# Patient Record
Sex: Male | Born: 1945 | Race: White | Hispanic: No | Marital: Married | Smoking: Never smoker
Health system: Southern US, Community
[De-identification: ages and names within clinical notes are randomized; demographics above are authoritative.]

## PROBLEM LIST (undated history)

## (undated) DIAGNOSIS — G4700 Insomnia, unspecified: Secondary | ICD-10-CM

## (undated) DIAGNOSIS — I10 Essential (primary) hypertension: Secondary | ICD-10-CM

## (undated) DIAGNOSIS — M67912 Unspecified disorder of synovium and tendon, left shoulder: Secondary | ICD-10-CM

## (undated) DIAGNOSIS — E7800 Pure hypercholesterolemia, unspecified: Secondary | ICD-10-CM

## (undated) DIAGNOSIS — F32A Depression, unspecified: Secondary | ICD-10-CM

## (undated) DIAGNOSIS — C4491 Basal cell carcinoma of skin, unspecified: Secondary | ICD-10-CM

## (undated) DIAGNOSIS — K5790 Diverticulosis of intestine, part unspecified, without perforation or abscess without bleeding: Secondary | ICD-10-CM

## (undated) DIAGNOSIS — N400 Enlarged prostate without lower urinary tract symptoms: Secondary | ICD-10-CM

## (undated) DIAGNOSIS — M1990 Unspecified osteoarthritis, unspecified site: Secondary | ICD-10-CM

## (undated) DIAGNOSIS — R7303 Prediabetes: Secondary | ICD-10-CM

## (undated) DIAGNOSIS — M25552 Pain in left hip: Secondary | ICD-10-CM

## (undated) DIAGNOSIS — M25561 Pain in right knee: Secondary | ICD-10-CM

## (undated) DIAGNOSIS — R519 Headache, unspecified: Secondary | ICD-10-CM

## (undated) DIAGNOSIS — K219 Gastro-esophageal reflux disease without esophagitis: Secondary | ICD-10-CM

## (undated) DIAGNOSIS — M25551 Pain in right hip: Secondary | ICD-10-CM

## (undated) DIAGNOSIS — I4510 Unspecified right bundle-branch block: Secondary | ICD-10-CM

## (undated) DIAGNOSIS — R011 Cardiac murmur, unspecified: Secondary | ICD-10-CM

## (undated) DIAGNOSIS — M25562 Pain in left knee: Secondary | ICD-10-CM

## (undated) HISTORY — PX: BASAL CELL CARCINOMA EXCISION: SHX1214

---

## 1998-12-31 ENCOUNTER — Encounter: Payer: Self-pay | Admitting: Cardiovascular Disease

## 1998-12-31 ENCOUNTER — Ambulatory Visit (HOSPITAL_COMMUNITY): Admission: RE | Admit: 1998-12-31 | Discharge: 1998-12-31 | Payer: Self-pay | Admitting: Cardiovascular Disease

## 2004-03-18 ENCOUNTER — Ambulatory Visit: Payer: Self-pay | Admitting: Gastroenterology

## 2004-04-21 ENCOUNTER — Ambulatory Visit: Payer: Self-pay | Admitting: Internal Medicine

## 2004-12-14 ENCOUNTER — Emergency Department (HOSPITAL_COMMUNITY): Admission: EM | Admit: 2004-12-14 | Discharge: 2004-12-15 | Payer: Self-pay | Admitting: Emergency Medicine

## 2004-12-16 ENCOUNTER — Ambulatory Visit: Payer: Self-pay | Admitting: Internal Medicine

## 2005-01-05 ENCOUNTER — Ambulatory Visit: Payer: Self-pay | Admitting: Internal Medicine

## 2005-02-04 ENCOUNTER — Ambulatory Visit: Payer: Self-pay | Admitting: Internal Medicine

## 2005-03-17 ENCOUNTER — Ambulatory Visit: Payer: Self-pay | Admitting: Internal Medicine

## 2005-03-24 ENCOUNTER — Ambulatory Visit: Payer: Self-pay | Admitting: Internal Medicine

## 2005-04-16 ENCOUNTER — Ambulatory Visit: Payer: Self-pay | Admitting: Internal Medicine

## 2005-04-30 ENCOUNTER — Ambulatory Visit: Payer: Self-pay | Admitting: Internal Medicine

## 2005-06-16 ENCOUNTER — Ambulatory Visit: Payer: Self-pay | Admitting: Internal Medicine

## 2005-06-25 ENCOUNTER — Ambulatory Visit: Payer: Self-pay | Admitting: Internal Medicine

## 2007-01-11 ENCOUNTER — Encounter: Payer: Self-pay | Admitting: Internal Medicine

## 2008-10-17 ENCOUNTER — Ambulatory Visit: Payer: Self-pay | Admitting: Diagnostic Radiology

## 2008-10-17 ENCOUNTER — Emergency Department (HOSPITAL_BASED_OUTPATIENT_CLINIC_OR_DEPARTMENT_OTHER): Admission: EM | Admit: 2008-10-17 | Discharge: 2008-10-17 | Payer: Self-pay | Admitting: Emergency Medicine

## 2008-12-20 ENCOUNTER — Emergency Department (HOSPITAL_BASED_OUTPATIENT_CLINIC_OR_DEPARTMENT_OTHER): Admission: EM | Admit: 2008-12-20 | Discharge: 2008-12-20 | Payer: Self-pay | Admitting: Emergency Medicine

## 2008-12-25 ENCOUNTER — Encounter (INDEPENDENT_AMBULATORY_CARE_PROVIDER_SITE_OTHER): Payer: Self-pay | Admitting: *Deleted

## 2009-08-05 ENCOUNTER — Encounter: Admission: RE | Admit: 2009-08-05 | Discharge: 2009-08-05 | Payer: Self-pay | Admitting: Neurology

## 2009-08-08 ENCOUNTER — Telehealth (INDEPENDENT_AMBULATORY_CARE_PROVIDER_SITE_OTHER): Payer: Self-pay | Admitting: *Deleted

## 2010-06-17 NOTE — Progress Notes (Signed)
  Phone Note Other Incoming   Request: Send information Summary of Call: Request received from [facility] forwarded to [facility].

## 2010-08-25 LAB — COMPREHENSIVE METABOLIC PANEL
ALT: 30 U/L (ref 0–53)
Alkaline Phosphatase: 70 U/L (ref 39–117)
BUN: 18 mg/dL (ref 6–23)
CO2: 25 mEq/L (ref 19–32)
GFR calc non Af Amer: >60 mL/min (ref >60)
Glucose, Bld: 146 mg/dL — ABNORMAL HIGH (ref 70–99)
Potassium: 4 mEq/L (ref 3.5–5.1)
Total Protein: 8 g/dL (ref 6.0–8.3)

## 2010-08-25 LAB — DIFFERENTIAL
Basophils Absolute: 0.1 10*3/uL (ref 0.0–0.1)
Basophils Relative: 1 % (ref 0–1)
Eosinophils Absolute: 0 10*3/uL (ref 0.0–0.7)
Monocytes Relative: 6 % (ref 3–12)
Neutro Abs: 10.1 10*3/uL — ABNORMAL HIGH (ref 1.7–7.7)
Neutrophils Relative %: 89 % — ABNORMAL HIGH (ref 43–77)

## 2010-08-25 LAB — CBC
HCT: 44 % (ref 39.0–52.0)
Hemoglobin: 15 g/dL (ref 13.0–17.0)
MCHC: 34.2 g/dL (ref 30.0–36.0)
RBC: 4.79 MIL/uL (ref 4.22–5.81)
RDW: 12 % (ref 11.5–15.5)

## 2010-08-25 LAB — URINALYSIS, ROUTINE W REFLEX MICROSCOPIC
Bilirubin Urine: NEGATIVE
Glucose, UA: NEGATIVE mg/dL
Specific Gravity, Urine: 1.025 (ref 1.005–1.030)
Urobilinogen, UA: 1 mg/dL (ref 0.0–1.0)
pH: 5.5 (ref 5.0–8.0)

## 2010-08-25 LAB — URINE CULTURE

## 2010-08-25 LAB — URINE MICROSCOPIC-ADD ON

## 2010-08-25 LAB — CULTURE, BLOOD (ROUTINE X 2)

## 2010-08-25 LAB — HEMOCCULT GUIAC POC 1CARD (OFFICE): Fecal Occult Bld: NEGATIVE

## 2010-08-25 LAB — LACTIC ACID, PLASMA: Lactic Acid, Venous: 2.1 mmol/L (ref 0.5–2.2)

## 2013-07-10 ENCOUNTER — Ambulatory Visit: Payer: Non-veteran care | Attending: Nurse Practitioner | Admitting: Physical Therapy

## 2013-07-10 DIAGNOSIS — M6281 Muscle weakness (generalized): Secondary | ICD-10-CM | POA: Insufficient documentation

## 2013-07-10 DIAGNOSIS — R609 Edema, unspecified: Secondary | ICD-10-CM | POA: Insufficient documentation

## 2013-07-10 DIAGNOSIS — M25669 Stiffness of unspecified knee, not elsewhere classified: Secondary | ICD-10-CM | POA: Insufficient documentation

## 2013-07-10 DIAGNOSIS — M25569 Pain in unspecified knee: Secondary | ICD-10-CM | POA: Insufficient documentation

## 2013-07-10 DIAGNOSIS — IMO0001 Reserved for inherently not codable concepts without codable children: Secondary | ICD-10-CM | POA: Insufficient documentation

## 2013-07-24 ENCOUNTER — Ambulatory Visit: Payer: Non-veteran care | Attending: Nurse Practitioner | Admitting: Physical Therapy

## 2013-07-24 DIAGNOSIS — M6281 Muscle weakness (generalized): Secondary | ICD-10-CM | POA: Insufficient documentation

## 2013-07-24 DIAGNOSIS — M25569 Pain in unspecified knee: Secondary | ICD-10-CM | POA: Insufficient documentation

## 2013-07-24 DIAGNOSIS — R609 Edema, unspecified: Secondary | ICD-10-CM | POA: Insufficient documentation

## 2013-07-24 DIAGNOSIS — M25669 Stiffness of unspecified knee, not elsewhere classified: Secondary | ICD-10-CM | POA: Insufficient documentation

## 2013-07-25 ENCOUNTER — Ambulatory Visit: Payer: Non-veteran care | Admitting: Physical Therapy

## 2013-08-07 ENCOUNTER — Ambulatory Visit: Payer: Non-veteran care | Admitting: Rehabilitation

## 2013-08-08 ENCOUNTER — Ambulatory Visit: Payer: Non-veteran care | Admitting: Physical Therapy

## 2014-01-24 ENCOUNTER — Encounter: Payer: Self-pay | Admitting: Gastroenterology

## 2014-08-20 ENCOUNTER — Encounter: Payer: Self-pay | Admitting: Gastroenterology

## 2016-03-08 ENCOUNTER — Encounter (HOSPITAL_BASED_OUTPATIENT_CLINIC_OR_DEPARTMENT_OTHER): Payer: Self-pay | Admitting: Emergency Medicine

## 2016-03-08 ENCOUNTER — Emergency Department (HOSPITAL_BASED_OUTPATIENT_CLINIC_OR_DEPARTMENT_OTHER)
Admission: EM | Admit: 2016-03-08 | Discharge: 2016-03-08 | Disposition: A | Discharge status: Home / Self Care | Payer: Medicare HMO | Attending: Emergency Medicine | Admitting: Emergency Medicine

## 2016-03-08 DIAGNOSIS — M25561 Pain in right knee: Secondary | ICD-10-CM | POA: Insufficient documentation

## 2016-03-08 DIAGNOSIS — I10 Essential (primary) hypertension: Secondary | ICD-10-CM | POA: Diagnosis not present

## 2016-03-08 HISTORY — DX: Essential (primary) hypertension: I10

## 2016-03-08 MED ORDER — NAPROXEN 500 MG PO TABS: 500.0000 mg | ORAL_TABLET | Freq: Two times a day (BID) | ORAL | 0 refills | Status: DC

## 2016-03-08 NOTE — ED Provider Notes (Signed)
[facility] Provider Note   CSN: [id number] Arrival date & time: 03/08/16  1611  By signing my name below, I, Soijett Blue, attest that this documentation has been prepared under the direction and in the presence of Audry Piliyler Bode Pieper, PA-C Electronically Signed: Soijett Blue, ED Scribe. 03/08/16. 5:10 PM.   History   Chief Complaint Chief Complaint  Patient presents with  . Knee Pain    HPI Mitchell Morgan is a 70 y.o. male with a PMHx of HTN, who presents to the Emergency Department complaining of gradually worsening right lateral knee pain onset 4 days. Pt reports that he was sitting in a chair and attempted to cross his legs when he felt a sharp pain to his right knee. Pt notes that his right lateral knee pain is worsened with initial ambulation following standing from a sitting position. Pt states that his right lateral knee pain is alleviated with rest. Pt denies recent injury, trauma, or fall. Pt is having associated symptoms of gait problem due to pain. He notes that he has tried Rx voltaren gel, heat, and ice with no relief of his symptoms. He denies color change, wound, rash, swelling, and any other symptoms. Pt denies taking blood thinner medications or PMHx of blood clots. Pt states that he is otherwise healthy.   The history is provided by the patient. No language interpreter was used.    Past Medical History:  Diagnosis Date  . Hypertension     There are no active problems to display for this patient.   History reviewed. No pertinent surgical history.     Home Medications    Prior to Admission medications   Not on File    Family History History reviewed. No pertinent family history.  Social History Social History  Substance Use Topics  . Smoking status: Never Smoker  . Smokeless tobacco: Not on file  . Alcohol use No     Allergies   Review of patient's allergies indicates no known allergies.   Review of Systems Review of Systems    Musculoskeletal: Positive for arthralgias (right knee) and gait problem (due to pain). Negative for joint swelling.  Skin: Negative for color change, rash and wound.   Physical Exam Updated Vital Signs BP 140/69   Pulse 61   Temp 97.7 F (36.5 C)   Resp 19   Ht 5\' 10"  (1.778 m)   Wt 220 lb (99.8 kg)   SpO2 98%   BMI 31.57 kg/m   Physical Exam  Constitutional: He is oriented to person, place, and time. Vital signs are normal. He appears well-developed and well-nourished. No distress.  HENT:  Head: Normocephalic and atraumatic.  Right Ear: Hearing normal.  Left Ear: Hearing normal.  Eyes: Conjunctivae and EOM are normal. Pupils are equal, round, and reactive to light.  Neck: Normal range of motion. Neck supple.  Cardiovascular: Normal rate and regular rhythm.   Pulmonary/Chest: Effort normal. No respiratory distress.  Abdominal: He exhibits no distension.  Musculoskeletal: Normal range of motion.       Right upper leg: He exhibits tenderness.  Point TTP right distal lateral femur above knee joint. No visible erythema, ecchymosis, or signs of infection.  Right knee:  Negative anterior/poster drawer bilaterally. Negative ballottement test. No varus or valgus laxity. No crepitus. No pain with flexion or extension.  Neurological: He is alert and oriented to person, place, and time.  Skin: Skin is warm and dry.  Psychiatric: He has a normal mood and affect.  His speech is normal and behavior is normal. Thought content normal.  Nursing note and vitals reviewed.    ED Treatments / Results  DIAGNOSTIC STUDIES: Oxygen Saturation is 98% on RA, nl by my interpretation.    COORDINATION OF CARE: 4:52 PM Discussed treatment plan with pt at bedside which includes referral to orthopedist, symptomatic treatment, ace wrap, and pt agreed to plan.   Procedures Procedures (including critical care time)  Medications Ordered in ED Medications - No data to display   Initial Impression /  Assessment and Plan / ED Course  I have reviewed the triage vital signs and the nursing notes.  Clinical Course   I have reviewed the relevant previous healthcare records. I obtained HPI from historian. Patient discussed with supervising physician  ED Course:  Assessment: Pt presents with right knee pain x 1 week ago, likely quadriceps strain as pain only with standing from sitting. No pain at rest or with ambulation. I do not suspect any fracture or dislocation. ROM intact. NVI. Pt advised to follow up with orthopedist. Pt will be given ace wrap while in the ED. Conservative therapy recommended and discussed. Patient will be discharged home & is agreeable with above plan. Returns precautions discussed. Pt appears safe for discharge.  Disposition/Plan:  DC Home Additional Verbal discharge instructions given and discussed with patient.  Pt Instructed to f/u with PCP and orthopedist in the next week for evaluation and treatment of symptoms. Return precautions given Pt acknowledges and agrees with plan  Supervising Physician Alvira Monday, MD   Final Clinical Impressions(s) / ED Diagnoses   Final diagnoses:  Acute pain of right knee    New Prescriptions New Prescriptions   No medications on file  I personally performed the services described in this documentation, which was scribed in my presence. The recorded information has been reviewed and is accurate.    Audry Pili, PA-C 03/08/16 1755    Alvira Monday, MD 03/09/16 [phone]

## 2016-03-08 NOTE — Discharge Instructions (Addendum)
Please read and follow all provided instructions.  Your diagnoses today include:  1. Acute pain of right knee    Tests performed today include: Vital signs. See below for your results today.   Medications prescribed:  Take as prescribed   Home care instructions:  Follow any educational materials contained in this packet.  *PRICE in the first 24-48 hours after injury: Protect (with brace, splint, sling), if given by your provider Rest Ice- Do not apply ice pack directly to your skin, place towel or similar between your skin and ice/ice pack. Apply ice for 20 min, then remove for 40 min while awake Compression- Wear brace, elastic bandage, splint as directed by your provider Elevate affected extremity above the level of your heart when not walking around for the first 24-48 hours   Use Ibuprofen (Motrin/Advil) 600mg  every 6 hours as needed for pain   Follow-up instructions: Please follow-up with your primary care provider for further evaluation of symptoms and treatment   Return instructions:  Please return to the Emergency Department if you do not get better, if you get worse, or new symptoms OR  - Fever (temperature greater than 101.67F)  - Bleeding that does not stop with holding pressure to the area    -Severe pain (please note that you may be more sore the day after your accident)  - Chest Pain  - Difficulty breathing  - Severe nausea or vomiting  - Inability to tolerate food and liquids  - Passing out  - Skin becoming red around your wounds  - Change in mental status (confusion or lethargy)  - New numbness or weakness    Please return if you have any other emergent concerns.  Additional Information:  Your vital signs today were: BP 140/69    Pulse 61    Temp 97.7 F (36.5 C)    Resp 19    Ht 5\' 10"  (1.778 m)    Wt 99.8 kg    SpO2 98%    BMI 31.57 kg/m  If your blood pressure (BP) was elevated above 135/85 this visit, please have this repeated by your doctor within  one month. ---------------

## 2016-03-08 NOTE — ED Notes (Signed)
PA at bedside.

## 2016-03-08 NOTE — ED Triage Notes (Signed)
Pt in c/o R knee pain x 4 days after crossing legs and feeling sharp pain to same knee. States pain is getting worse. Pt alert, interactive, ambulatory in NAD.

## 2016-07-21 DIAGNOSIS — E119 Type 2 diabetes mellitus without complications: Secondary | ICD-10-CM | POA: Insufficient documentation

## 2017-08-13 ENCOUNTER — Encounter (HOSPITAL_BASED_OUTPATIENT_CLINIC_OR_DEPARTMENT_OTHER): Payer: Self-pay

## 2017-08-13 ENCOUNTER — Other Ambulatory Visit: Payer: Self-pay

## 2017-08-13 ENCOUNTER — Emergency Department (HOSPITAL_BASED_OUTPATIENT_CLINIC_OR_DEPARTMENT_OTHER)
Admission: EM | Admit: 2017-08-13 | Discharge: 2017-08-14 | Disposition: A | Discharge status: Home / Self Care | Payer: Medicare HMO | Attending: Emergency Medicine | Admitting: Emergency Medicine

## 2017-08-13 DIAGNOSIS — I10 Essential (primary) hypertension: Secondary | ICD-10-CM | POA: Diagnosis not present

## 2017-08-13 DIAGNOSIS — Z79899 Other long term (current) drug therapy: Secondary | ICD-10-CM | POA: Insufficient documentation

## 2017-08-13 DIAGNOSIS — R630 Anorexia: Secondary | ICD-10-CM | POA: Insufficient documentation

## 2017-08-13 DIAGNOSIS — R1084 Generalized abdominal pain: Secondary | ICD-10-CM | POA: Insufficient documentation

## 2017-08-13 DIAGNOSIS — R110 Nausea: Secondary | ICD-10-CM | POA: Diagnosis not present

## 2017-08-13 HISTORY — DX: Pure hypercholesterolemia, unspecified: E78.00

## 2017-08-13 LAB — URINALYSIS, ROUTINE W REFLEX MICROSCOPIC
Bilirubin Urine: NEGATIVE
GLUCOSE, UA: NEGATIVE mg/dL
KETONES UR: NEGATIVE mg/dL
Leukocytes, UA: NEGATIVE
Nitrite: NEGATIVE
PH: 5.5 (ref 5.0–8.0)
PROTEIN: NEGATIVE mg/dL
Specific Gravity, Urine: 1.025 (ref 1.005–1.030)

## 2017-08-13 LAB — URINALYSIS, MICROSCOPIC (REFLEX): WBC, UA: NONE SEEN WBC/hpf (ref 0–5)

## 2017-08-13 NOTE — ED Triage Notes (Signed)
C/o abd pain x approx 1 month-was seen by PCP 3/4-had US-was dx constipation-used miralax with results-abd pain cont'd-pt was to have outpt CT scan and has GI appt-states pain is worse and is worse after po intake-NAD-steady gait

## 2017-08-13 NOTE — ED Notes (Signed)
Patient also informed me that he had lost 8-10 lbs in a week.

## 2017-08-14 ENCOUNTER — Emergency Department (HOSPITAL_BASED_OUTPATIENT_CLINIC_OR_DEPARTMENT_OTHER): Payer: Medicare HMO

## 2017-08-14 LAB — COMPREHENSIVE METABOLIC PANEL
ALBUMIN: 4.3 g/dL (ref 3.5–5.0)
ALK PHOS: 45 U/L (ref 38–126)
ALT: 34 U/L (ref 17–63)
AST: 36 U/L (ref 15–41)
Anion gap: 11 (ref 5–15)
BILIRUBIN TOTAL: 1.3 mg/dL — AB (ref 0.3–1.2)
BUN: 14 mg/dL (ref 6–20)
CALCIUM: 9.5 mg/dL (ref 8.9–10.3)
CO2: 24 mmol/L (ref 22–32)
Chloride: 102 mmol/L (ref 101–111)
Creatinine, Ser: 0.9 mg/dL (ref 0.61–1.24)
GFR calc Af Amer: >60 mL/min (ref >60)
GFR calc non Af Amer: >60 mL/min (ref >60)
GLUCOSE: 114 mg/dL — AB (ref 65–99)
Potassium: 3.6 mmol/L (ref 3.5–5.1)
Sodium: 137 mmol/L (ref 135–145)
TOTAL PROTEIN: 7.2 g/dL (ref 6.5–8.1)

## 2017-08-14 LAB — CBC
HCT: 40 % (ref 39.0–52.0)
Hemoglobin: 14.2 g/dL (ref 13.0–17.0)
MCH: 32.5 pg (ref 26.0–34.0)
MCHC: 35.5 g/dL (ref 30.0–36.0)
MCV: 91.5 fL (ref 78.0–100.0)
PLATELETS: 252 10*3/uL (ref 150–400)
RBC: 4.37 MIL/uL (ref 4.22–5.81)
RDW: 12 % (ref 11.5–15.5)
WBC: 7.3 10*3/uL (ref 4.0–10.5)

## 2017-08-14 LAB — LIPASE, BLOOD: Lipase: 28 U/L (ref 11–51)

## 2017-08-14 MED ORDER — IOPAMIDOL (ISOVUE-300) INJECTION 61%
100.0000 mL | Freq: Once | INTRAVENOUS | Status: AC | PRN
  Administered 2017-08-14: 100 mL via INTRAVENOUS

## 2017-08-14 MED ORDER — PANTOPRAZOLE SODIUM 40 MG IV SOLR
40.0000 mg | Freq: Once | INTRAVENOUS | Status: AC
  Administered 2017-08-14: 40 mg via INTRAVENOUS
  Filled 2017-08-14: qty 40

## 2017-08-14 NOTE — ED Provider Notes (Signed)
[facility] Provider Note: Lowella Dell, MD, FACEP  CSN: [id number] MRN: [id number] ARRIVAL: 08/13/17 at 2238 ROOM: [facility]05/[facility]   CHIEF COMPLAINT  Abdominal Pain   HISTORY OF PRESENT ILLNESS  08/14/17 2:20 AM Mitchell Morgan is a 72 y.o. male who has had abdominal pain since January of this year.  He describes the pain as generalized, though it sometimes moves around, and a sensation of fullness.  It has waxed and waned but became more severe the past several days.  He had an unremarkable colonoscopy and upper endoscopy in February 2018.  He has had an unremarkable abdominal ultrasound.  He was given a trial of MiraLAX for possible constipation without relief.  A CT scan was scheduled for next week but because he worsened yesterday he came to the ED.  His pain is moderate to severe at times, somewhat worse with movement or palpation.  He has had a decreased appetite.  He has had occasional transient nausea but no vomiting.  His stools were looser yesterday than usual but were not frank diarrhea.   Past Medical History:  Diagnosis Date  . High cholesterol   . Hypertension     History reviewed. No pertinent surgical history.  No family history on file.  Social History   Tobacco Use  . Smoking status: Never Smoker  . Smokeless tobacco: Never Used  Substance Use Topics  . Alcohol use: No  . Drug use: Never    Prior to Admission medications   Medication Sig Start Date End Date Taking? Authorizing Provider  naproxen (NAPROSYN) 500 MG tablet Take 1 tablet (500 mg total) by mouth 2 (two) times daily. 03/08/16   Audry Pili, PA-C    Allergies Shrimp [shellfish allergy]   REVIEW OF SYSTEMS  Negative except as noted here or in the History of Present Illness.   PHYSICAL EXAMINATION  Initial Vital Signs Blood pressure 121/70, pulse 62, temperature 98.4 F (36.9 C), temperature source Oral, resp. rate 16, height 5\' 10"  (1.778 m), weight 103 kg (227 lb), SpO2 98  %.  Examination General: Well-developed, well-nourished male in no acute distress; appearance consistent with age of record HENT: normocephalic; atraumatic Eyes: pupils equal, round and reactive to light; extraocular muscles intact Neck: supple Heart: regular rate and rhythm Lungs: clear to auscultation bilaterally Abdomen: soft; nondistended; mild diffuse tenderness; no masses or hepatosplenomegaly; bowel sounds present Extremities: No deformity; full range of motion; pulses normal Neurologic: Awake, alert and oriented; motor function intact in all extremities and symmetric; no facial droop Skin: Warm and dry Psychiatric: Normal mood and affect   RESULTS  Summary of this visit's results, reviewed by myself:   EKG Interpretation  Date/Time:    Ventricular Rate:    PR Interval:    QRS Duration:   QT Interval:    QTC Calculation:   R Axis:     Text Interpretation:        Laboratory Studies: Results for orders placed or performed during the hospital encounter of 08/13/17 (from the past 24 hour(s))  Lipase, blood     Status: None   Collection Time: 08/13/17 11:25 PM  Result Value Ref Range   Lipase 28 11 - 51 U/L  Comprehensive metabolic panel     Status: Abnormal   Collection Time: 08/13/17 11:25 PM  Result Value Ref Range   Sodium 137 135 - 145 mmol/L   Potassium 3.6 3.5 - 5.1 mmol/L   Chloride 102 101 - 111 mmol/L   CO2  24 22 - 32 mmol/L   Glucose, Bld 114 (H) 65 - 99 mg/dL   BUN 14 6 - 20 mg/dL   Creatinine, Ser 9.600.90 0.61 - 1.24 mg/dL   Calcium 9.5 8.9 - 45.410.3 mg/dL   Total Protein 7.2 6.5 - 8.1 g/dL   Albumin 4.3 3.5 - 5.0 g/dL   AST 36 15 - 41 U/L   ALT 34 17 - 63 U/L   Alkaline Phosphatase 45 38 - 126 U/L   Total Bilirubin 1.3 (H) 0.3 - 1.2 mg/dL   GFR calc non Af Amer >60 >60 mL/min   GFR calc Af Amer >60 >60 mL/min   Anion gap 11 5 - 15  CBC     Status: None   Collection Time: 08/13/17 11:25 PM  Result Value Ref Range   WBC 7.3 4.0 - 10.5 K/uL   RBC  4.37 4.22 - 5.81 MIL/uL   Hemoglobin 14.2 13.0 - 17.0 g/dL   HCT 09.840.0 11.939.0 - 14.752.0 %   MCV 91.5 78.0 - 100.0 fL   MCH 32.5 26.0 - 34.0 pg   MCHC 35.5 30.0 - 36.0 g/dL   RDW 82.912.0 56.211.5 - 13.015.5 %   Platelets 252 150 - 400 K/uL  Urinalysis, Routine w reflex microscopic     Status: Abnormal   Collection Time: 08/13/17 11:26 PM  Result Value Ref Range   Color, Urine YELLOW YELLOW   APPearance CLEAR CLEAR   Specific Gravity, Urine 1.025 1.005 - 1.030   pH 5.5 5.0 - 8.0   Glucose, UA NEGATIVE NEGATIVE mg/dL   Hgb urine dipstick TRACE (A) NEGATIVE   Bilirubin Urine NEGATIVE NEGATIVE   Ketones, ur NEGATIVE NEGATIVE mg/dL   Protein, ur NEGATIVE NEGATIVE mg/dL   Nitrite NEGATIVE NEGATIVE   Leukocytes, UA NEGATIVE NEGATIVE  Urinalysis, Microscopic (reflex)     Status: Abnormal   Collection Time: 08/13/17 11:26 PM  Result Value Ref Range   RBC / HPF 0-5 0 - 5 RBC/hpf   WBC, UA NONE SEEN 0 - 5 WBC/hpf   Bacteria, UA RARE (A) NONE SEEN   Squamous Epithelial / LPF 0-5 (A) NONE SEEN   Imaging Studies: Ct Abdomen Pelvis W Contrast  Result Date: 08/14/2017 CLINICAL DATA:  History of diverticulitis.  Mid abdominal pain. EXAM: CT ABDOMEN AND PELVIS WITH CONTRAST TECHNIQUE: Multidetector CT imaging of the abdomen and pelvis was performed using the standard protocol following bolus administration of intravenous contrast. CONTRAST:  100mL ISOVUE-300 IOPAMIDOL (ISOVUE-300) INJECTION 61% COMPARISON:  Abdominal ultrasound 07/19/2017 FINDINGS: Lower chest: Calcific atherosclerotic disease of the coronary arteries. Mild circumferential thickening of the distal esophagus. Hepatobiliary: No focal liver abnormality is seen. No gallstones, gallbladder wall thickening, or biliary dilatation. Pancreas: Unremarkable. No pancreatic ductal dilatation or surrounding inflammatory changes. Spleen: Normal in size without focal abnormality. Adrenals/Urinary Tract: Normal adrenal glands. Normal right kidney. 2.9 cm left upper  pole renal cyst and 1 cm left lower pole renal cyst. Stomach/Bowel: Normal stomach and small bowel. Normal appendix. Scattered left colonic diverticulosis. No significant pericolonic inflammatory changes to suggest acute diverticulitis. Mucosal thickening of the sigmoid colon may be secondary to chronic diverticulitis. Vascular/Lymphatic: Aortic atherosclerosis. No enlarged abdominal or pelvic lymph nodes. Reproductive: Enlargement of the prostate gland which measures 6.2 cm. Other: No abdominal wall hernia or abnormality. No abdominopelvic ascites. Musculoskeletal: Multilevel osteoarthritic changes of the spine. IMPRESSION: Left colonic diverticulosis with probable chronic diverticulitis. 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 Mild circumferential thickening of the distal esophagus.  Please correlate to results of endoscopy. Enlargement of the prostate gland. Please correlate to serum PSA values. Electronically Signed   By: Ted Mcalpine M.D.   On: 08/14/2017 03:21    ED COURSE  Nursing notes and initial vitals signs, including pulse oximetry, reviewed.  Vitals:   08/13/17 2251 08/14/17 0145 08/14/17 0301  BP: 136/78 121/70 (!) 144/76  Pulse: 68 62 68  Resp: 16  18  Temp: 98.4 F (36.9 C)    TempSrc: Oral    SpO2: 98% 98% 99%  Weight: 103 kg (227 lb)    Height: 5\' 10"  (1.778 m)     3:37 AM Patient advised of CT findings.  Clinically I do not suspect acute sigmoid diverticulitis.  His tenderness is not focal in that area.  He has known enlarged prostate and his PSA has been followed by his PCP.  He states the pain in his abdomen worsened over the past few days after discontinuing his Protonix.  We will have him return start his Protonix and follow-up with his PCP and his gastroenterologist.  He has already contacted them for appointments in the next few days.  PROCEDURES    ED DIAGNOSES     ICD-10-CM   1. Generalized abdominal pain R10.84         Paula Libra, MD 08/14/17 [phone]

## 2017-08-14 NOTE — ED Notes (Signed)
Patient transported to CT 

## 2017-08-14 NOTE — ED Notes (Signed)
Pt understood dc material. NAD noted. 

## 2020-02-14 ENCOUNTER — Other Ambulatory Visit: Payer: Self-pay

## 2020-02-14 ENCOUNTER — Emergency Department (HOSPITAL_BASED_OUTPATIENT_CLINIC_OR_DEPARTMENT_OTHER): Payer: Medicare HMO

## 2020-02-14 ENCOUNTER — Emergency Department (HOSPITAL_BASED_OUTPATIENT_CLINIC_OR_DEPARTMENT_OTHER)
Admission: EM | Admit: 2020-02-14 | Discharge: 2020-02-14 | Disposition: A | Discharge status: Home / Self Care | Payer: Medicare HMO | Attending: Emergency Medicine | Admitting: Emergency Medicine

## 2020-02-14 ENCOUNTER — Encounter (HOSPITAL_BASED_OUTPATIENT_CLINIC_OR_DEPARTMENT_OTHER): Payer: Self-pay | Admitting: Emergency Medicine

## 2020-02-14 DIAGNOSIS — H4902 Third [oculomotor] nerve palsy, left eye: Secondary | ICD-10-CM

## 2020-02-14 DIAGNOSIS — H02432 Paralytic ptosis of left eyelid: Secondary | ICD-10-CM | POA: Diagnosis present

## 2020-02-14 DIAGNOSIS — I10 Essential (primary) hypertension: Secondary | ICD-10-CM | POA: Insufficient documentation

## 2020-02-14 LAB — CBC WITH DIFFERENTIAL/PLATELET
Abs Immature Granulocytes: 0.02 10*3/uL (ref 0.00–0.07)
Basophils Absolute: 0.1 10*3/uL (ref 0.0–0.1)
Basophils Relative: 1 %
Eosinophils Absolute: 0.1 10*3/uL (ref 0.0–0.5)
Eosinophils Relative: 1 %
HCT: 39.3 % (ref 39.0–52.0)
Hemoglobin: 13.6 g/dL (ref 13.0–17.0)
Immature Granulocytes: 0 %
Lymphocytes Relative: 21 %
Lymphs Abs: 2.3 10*3/uL (ref 0.7–4.0)
MCH: 31.7 pg (ref 26.0–34.0)
MCHC: 34.6 g/dL (ref 30.0–36.0)
MCV: 91.6 fL (ref 80.0–100.0)
Monocytes Absolute: 1 10*3/uL (ref 0.1–1.0)
Monocytes Relative: 10 %
Neutro Abs: 7.3 10*3/uL (ref 1.7–7.7)
Neutrophils Relative %: 67 %
Platelets: 228 10*3/uL (ref 150–400)
RBC: 4.29 MIL/uL (ref 4.22–5.81)
RDW: 11.9 % (ref 11.5–15.5)
WBC: 10.9 10*3/uL — ABNORMAL HIGH (ref 4.0–10.5)
nRBC: 0 % (ref 0.0–0.2)

## 2020-02-14 LAB — BASIC METABOLIC PANEL
Anion gap: 8 (ref 5–15)
BUN: 20 mg/dL (ref 8–23)
CO2: 26 mmol/L (ref 22–32)
Calcium: 9 mg/dL (ref 8.9–10.3)
Chloride: 101 mmol/L (ref 98–111)
Creatinine, Ser: 0.92 mg/dL (ref 0.61–1.24)
GFR calc Af Amer: >60 mL/min (ref >60)
GFR calc non Af Amer: >60 mL/min (ref >60)
Glucose, Bld: 128 mg/dL — ABNORMAL HIGH (ref 70–99)
Potassium: 3.9 mmol/L (ref 3.5–5.1)
Sodium: 135 mmol/L (ref 135–145)

## 2020-02-14 LAB — PROTIME-INR
INR: 1.1 (ref 0.8–1.2)
Prothrombin Time: 13.6 seconds (ref 11.4–15.2)

## 2020-02-14 MED ORDER — IOHEXOL 350 MG/ML SOLN
100.0000 mL | Freq: Once | INTRAVENOUS | Status: AC | PRN
  Administered 2020-02-14: 75 mL via INTRAVENOUS

## 2020-02-14 NOTE — ED Notes (Signed)
Patient transported to CT 

## 2020-02-14 NOTE — ED Provider Notes (Signed)
[facility] EMERGENCY DEPARTMENT Provider Note   CSN: [id number] Arrival date & time: 02/14/20  1026     History Chief Complaint  Patient presents with  . eye lid paralysis    left    Mitchell Morgan is a 74 y.o. male.  Presents to ER with concern for left eyelid weakness, eye muscle weakness.  Over the past week or so, patient has noted weakness of his left eyelid, increasing double vision.  Went to his ophthalmologist, was referred to neuro-ophthalmologist.  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  Reviewed case with Dr. Sherryll Burger, neuro-ophthalmologist.  He states this is most likely a microvascular lesion which she anticipates will resolve with conservative management over the next couple months.  However he wants to rule out a PCOM aneurysm.  If CTA negative, he recommends wearing eyelid patch and follow-up in his clinic.  Continue baby aspirin, follow-up with PCP as well.  He feels this finding does not warrant admission for a full stroke work up and would ordinarily manage this out patient.   Patient denies any blurred vision, just double vision when opening both eyes. No speech changes, no difficulty walking, balance issues, no numbness or weakness elsewhere.  No change in taste or hearing.  HPI     Past Medical History:  Diagnosis Date  . High cholesterol   . Hypertension     There are no problems to display for this patient.   History reviewed. No pertinent surgical history.     No family history on file.  Social History   Tobacco Use  . Smoking status: Never Smoker  . Smokeless tobacco: Never Used  Substance Use Topics  . Alcohol use: No  . Drug use: Never    Home Medications Prior to Admission medications   Not on File    Allergies    Shrimp [shellfish allergy]  Review of Systems   Review of Systems  Constitutional:  Negative for chills and fever.  HENT: Negative for ear pain and sore throat.   Eyes: Positive for visual disturbance. Negative for pain.  Respiratory: Negative for cough and shortness of breath.   Cardiovascular: Negative for chest pain and palpitations.  Gastrointestinal: Negative for abdominal pain and vomiting.  Genitourinary: Negative for dysuria and hematuria.  Musculoskeletal: Negative for arthralgias and back pain.  Skin: Negative for color change and rash.  Neurological: Positive for weakness. Negative for seizures and syncope.  All other systems reviewed and are negative.   Physical Exam Updated Vital Signs BP 139/74 (BP Location: Right Arm)   Pulse 62   Temp 98.9 F (37.2 C)   Resp 16   SpO2 98%   Physical Exam Vitals and nursing note reviewed.  Constitutional:      Appearance: He is well-developed.  HENT:     Head: Normocephalic and atraumatic.  Eyes:     Conjunctiva/sclera: Conjunctivae normal.  Cardiovascular:     Rate and Rhythm: Normal rate and regular rhythm.     Heart sounds: No murmur heard.   Pulmonary:     Effort: Pulmonary effort is normal. No respiratory distress.     Breath sounds: Normal breath sounds.  Abdominal:     Palpations: Abdomen is soft.     Tenderness: There is no abdominal tenderness.  Musculoskeletal:     Cervical back: Neck supple.  Skin:    General: Skin  is warm and dry.     Capillary Refill: Capillary refill takes less than 2 seconds.  Neurological:     Mental Status: He is alert.     Comments: AAOx3 CN 2-12 intact, except isolated CN III palsy No APD Speech clear visual fields intact 5/5 strength in b/l UE and LE Sensation to light touch intact in b/l UE and LE Normal FNF Normal gait      ED Results / Procedures / Treatments   Labs (all labs ordered are listed, but only abnormal results are displayed) Labs Reviewed  CBC WITH DIFFERENTIAL/PLATELET - Abnormal; Notable for the following components:      Result Value     WBC 10.9 (*)    All other components within normal limits  BASIC METABOLIC PANEL - Abnormal; Notable for the following components:   Glucose, Bld 128 (*)    All other components within normal limits  PROTIME-INR    EKG EKG Interpretation  Date/Time:  Wednesday February 14 2020 10:32:58 EDT Ventricular Rate:  67 PR Interval:  162 QRS Duration: 162 QT Interval:  432 QTC Calculation: 456 R Axis:   -89 Text Interpretation: Normal sinus rhythm Left axis deviation Right bundle branch block Anterolateral infarct , age undetermined Abnormal ECG Confirmed by Marianna Fuss ([id number]) on 02/14/2020 11:45:21 AM   Radiology No results found.  Procedures Procedures (including critical care time)  Medications Ordered in ED Medications  iohexol (OMNIPAQUE) 350 MG/ML injection 100 mL (75 mLs Intravenous Contrast Given 02/14/20 1508)    ED Course  I have reviewed the triage vital signs and the nursing notes.  Pertinent labs & imaging results that were available during my care of the patient were reviewed by me and considered in my medical decision making (see chart for details).  Clinical Course as of Feb 13 1522  Wed Feb 14, 2020  1253 shah   [RD]  1312 Contacted [place] [facility], nurse will try to get a hold of him   [RD]    Clinical Course User Index [RD] Milagros Loll, MD   MDM Rules/Calculators/A&P                          74 year old male presents to ER from neuro ophthalmology office with concern for CN III palsy, sent to rule out PCOM aneurysm.  On physical exam, patient has isolated cranial nerve III palsy, no other neurologic findings.  CTA head and neck has been ordered.  Signed out to Dr. Silverio Lay.  Per recommendations of Dr. Sherryll Burger, if CT negative, eye patch, follow-up outpatient with primary doctor and his clinic, continue baby aspirin, no other changes.   Final Clinical Impression(s) / ED Diagnoses Final diagnoses:  CN III palsy, left    Rx / DC Orders ED  Discharge Orders    None       Milagros Loll, MD 02/14/20 1523

## 2020-02-14 NOTE — ED Provider Notes (Signed)
  Physical Exam  BP (!) 141/79 (BP Location: Right Arm)   Pulse 62   Temp 98.9 F (37.2 C)   Resp 18   SpO2 98%   Physical Exam  ED Course/Procedures   Clinical Course as of Feb 14 1619  Wed Feb 14, 2020  1253 shah   [RD]  1312 Contacted [place] [facility] office, nurse will try to get a hold of him   [RD]    Clinical Course User Index [RD] Milagros Loll, MD    Procedures  MDM  Patient care assumed at 3 PM.  Patient has a isolated cranial nerve III palsy.  Signout pending CTA to rule out aneurysm and if is negative he can follow-up with ophthalmology outpatient  4:22 PM CTA showed no aneurysm. Stable for discharge    Charlynne Pander, MD 02/14/20 1622

## 2020-02-14 NOTE — Discharge Instructions (Addendum)
Wear eye patch.   See eye doctor for follow up   Return to ER if you have worse double vision, headaches, trouble speaking

## 2020-02-14 NOTE — ED Triage Notes (Addendum)
Eye issues x 5 days ,  Double vision , headache , seen by specialist sent to Ed  For further eval and rule out aneurysm per pt. Obvious left eye lid weakness. Reports  Eye drainage as well .

## 2021-06-07 IMAGING — CT CT ANGIO NECK
1 of 11 series · 5 of 33 positions shown · IV contrast (Omnipaque)
Comparison: MRI head 08/05/2009

CLINICAL DATA: Acute neuro deficit. Suspect stroke. Left facial
weakness, double vision.

EXAM:
CT ANGIOGRAPHY HEAD AND NECK
TECHNIQUE: Multidetector CT imaging of the head and neck was performed using
the standard protocol during bolus administration of intravenous
contrast. Multiplanar CT image reconstructions and MIPs were
obtained to evaluate the vascular anatomy. Carotid stenosis
measurements (when applicable) are obtained utilizing NASCET
criteria, using the distal internal carotid diameter as the
denominator.
CONTRAST:  75mL OMNIPAQUE IOHEXOL 350 MG/ML SOLN

[Series 11: axial thin · axial · 0.44mm/px · z∈[-323,-79]mm · 5 of 368 slices shown]
[im 62/368  soft-tissue]
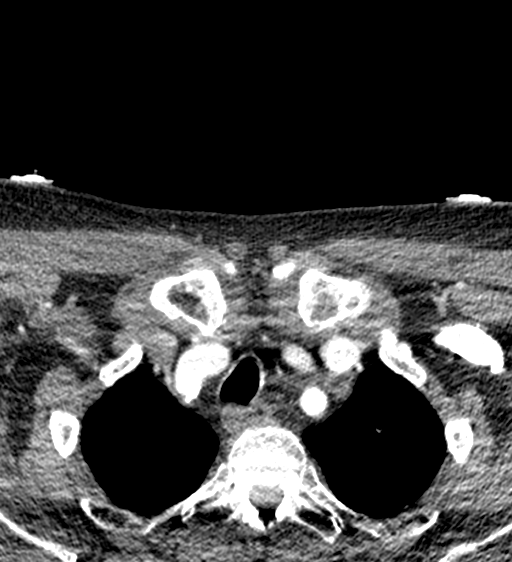
[im 123/368  bone]
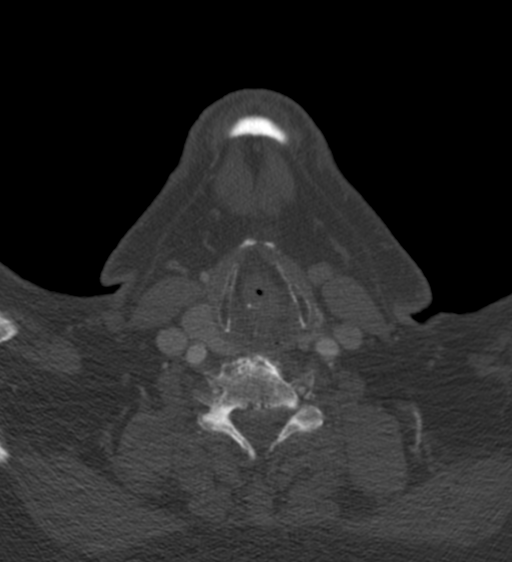
[im 184/368  soft-tissue]
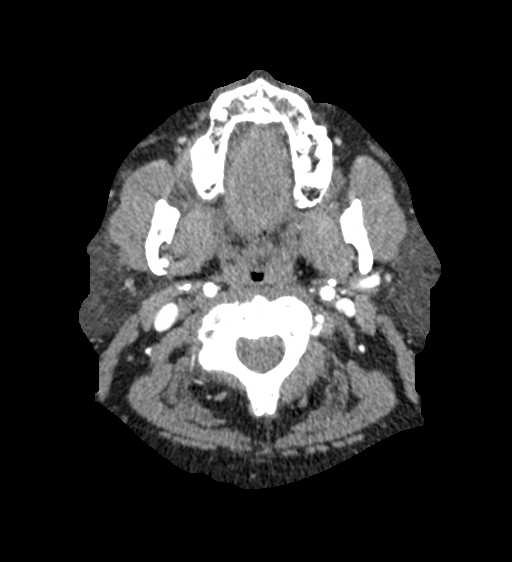
[im 245/368  bone]
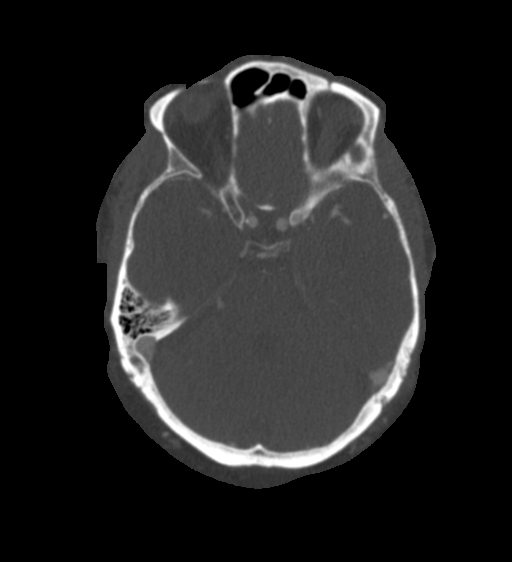
[im 306/368  soft-tissue]
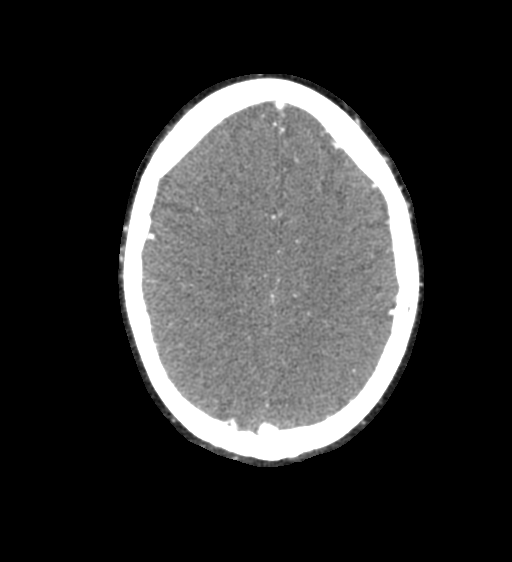

[5 of 33 positions shown; findings below may reference images not displayed]

FINDINGS: CT HEAD FINDINGS

Brain: No evidence of acute infarction, hemorrhage, hydrocephalus,
extra-axial collection or mass lesion/mass effect.

Vascular: Negative for hyperdense vessel

Skull: Negative

Sinuses: Paranasal sinuses clear.

Orbits: Negative orbit

Review of the MIP images confirms the above findings

CTA NECK FINDINGS

Aortic arch: Standard branching. Imaged portion shows no evidence of
aneurysm or dissection. No significant stenosis of the major arch
vessel origins.

Right carotid system: Mild atherosclerotic calcification right
carotid bifurcation without significant stenosis.

Left carotid system: Atherosclerotic calcification proximal left
internal carotid artery with approximately 50% diameter stenosis.
Left external carotid artery widely patent.

Vertebral arteries: Small vertebral arteries bilaterally are patent
to the basilar without stenosis.

Skeleton: Cervical spondylosis diffusely. Anterolisthesis C4-5. No
acute skeletal abnormality.

Other neck: Negative for mass or adenopathy.

Upper chest: Lung apices clear bilaterally.

Review of the MIP images confirms the above findings

CTA HEAD FINDINGS

Anterior circulation: Cavernous carotid widely patent bilaterally
with minimal atherosclerotic disease. Anterior and middle cerebral
arteries patent bilaterally without stenosis.

Posterior circulation: Both vertebral arteries patent to the
basilar. Mild calcific stenosis distal left vertebral artery. PICA
not well visualized. AICA patent bilaterally. Small basilar due to
fetal origin of the posterior cerebral arteries. Basilar ends in the
superior cerebellar artery bilaterally. Both posterior cerebral
arteries have origin from the internal carotid artery and are widely
patent without stenosis or occlusion.

Venous sinuses: Negative

Anatomic variants: None

Review of the MIP images confirms the above findings
IMPRESSION: 1. No acute intracranial abnormality.
2. Negative for intracranial large vessel occlusion
3. Mild atherosclerotic disease right carotid bifurcation without
stenosis. 50% diameter stenosis proximal left internal carotid
artery due to calcific stenosis.
4. Both vertebral arteries patent to the basilar with mild stenosis
distally.

## 2022-01-01 ENCOUNTER — Encounter: Payer: Self-pay | Admitting: Podiatry

## 2022-01-01 ENCOUNTER — Ambulatory Visit: Payer: Medicare HMO | Admitting: Podiatry

## 2022-01-01 DIAGNOSIS — L600 Ingrowing nail: Secondary | ICD-10-CM | POA: Diagnosis not present

## 2022-01-01 NOTE — Progress Notes (Signed)
  Subjective:  Patient ID: Mitchell Morgan, male    DOB: [birth year],   MRN: [id number]  Chief Complaint  Patient presents with   Ingrown Toenail    Left great toe possible ingrown - patient is not a diabetic     76 y.o. male presents for concern of left great ingrown toenail. Relates the right side of his left great toe has been sore. Relates he was at a salon and does not think they got all of the nail.  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  Denies any other pedal complaints. Denies n/v/f/c.   Past Medical History:  Diagnosis Date   High cholesterol    Hypertension     Objective:  Physical Exam: Vascular: DP/PT pulses 2/4 bilateral. CFT <3 seconds. Normal hair growth on digits. No edema.  Skin. No lacerations or abrasions bilateral feet. Left hallux nail medial border incurvated. No erythema edema or purulence noted.  Musculoskeletal: MMT 5/5 bilateral lower extremities in DF, PF, Inversion and Eversion. Deceased ROM in DF of ankle joint.  Neurological: Sensation intact to light touch.   Assessment:   1. Ingrown left greater toenail      Plan:  Patient was evaluated and treated and all questions answered. Patient requesting to have nail trimmed today as he feels there is a piece of ingrown.  Nail on left hallux debrided back in slant back fashion. Irrigated and covered with neosporin and a band aid.  Discussed in future if continues to have problem we can try ingrown nail removal procedure.  Will follow-up as needed.      Louann Sjogren, DPM

## 2022-09-28 ENCOUNTER — Encounter (HOSPITAL_BASED_OUTPATIENT_CLINIC_OR_DEPARTMENT_OTHER): Payer: Self-pay | Admitting: Urology

## 2022-09-28 ENCOUNTER — Emergency Department (HOSPITAL_BASED_OUTPATIENT_CLINIC_OR_DEPARTMENT_OTHER)
Admission: EM | Admit: 2022-09-28 | Discharge: 2022-09-28 | Discharge status: Against Medical Advice | Payer: No Typology Code available for payment source | Attending: Emergency Medicine | Admitting: Emergency Medicine

## 2022-09-28 ENCOUNTER — Other Ambulatory Visit: Payer: Self-pay

## 2022-09-28 DIAGNOSIS — R200 Anesthesia of skin: Secondary | ICD-10-CM | POA: Diagnosis present

## 2022-09-28 DIAGNOSIS — Z5321 Procedure and treatment not carried out due to patient leaving prior to being seen by health care provider: Secondary | ICD-10-CM | POA: Insufficient documentation

## 2022-09-28 NOTE — ED Triage Notes (Signed)
Pt states approx 3 weeks ago has gotten numbness in bilateral legs and bilateral feet, states is now having to ambulate with a walker and has gotten worse over past 10 days  Pain intermittent from mid back down

## 2022-10-13 ENCOUNTER — Emergency Department (HOSPITAL_COMMUNITY): Payer: No Typology Code available for payment source

## 2022-10-13 ENCOUNTER — Encounter (HOSPITAL_COMMUNITY): Payer: Self-pay

## 2022-10-13 ENCOUNTER — Inpatient Hospital Stay (HOSPITAL_COMMUNITY)
Admission: EM | Admit: 2022-10-13 | Discharge: 2022-10-22 | DRG: 519 | Disposition: A | Discharge status: Home Health | Payer: No Typology Code available for payment source | Attending: Internal Medicine | Admitting: Internal Medicine

## 2022-10-13 ENCOUNTER — Other Ambulatory Visit: Payer: Self-pay

## 2022-10-13 DIAGNOSIS — Z7982 Long term (current) use of aspirin: Secondary | ICD-10-CM

## 2022-10-13 DIAGNOSIS — R29898 Other symptoms and signs involving the musculoskeletal system: Secondary | ICD-10-CM | POA: Diagnosis not present

## 2022-10-13 DIAGNOSIS — E538 Deficiency of other specified B group vitamins: Secondary | ICD-10-CM | POA: Diagnosis present

## 2022-10-13 DIAGNOSIS — Z6831 Body mass index (BMI) 31.0-31.9, adult: Secondary | ICD-10-CM

## 2022-10-13 DIAGNOSIS — I10 Essential (primary) hypertension: Secondary | ICD-10-CM | POA: Diagnosis present

## 2022-10-13 DIAGNOSIS — M4804 Spinal stenosis, thoracic region: Principal | ICD-10-CM | POA: Diagnosis present

## 2022-10-13 DIAGNOSIS — K649 Unspecified hemorrhoids: Secondary | ICD-10-CM | POA: Diagnosis not present

## 2022-10-13 DIAGNOSIS — Z91013 Allergy to seafood: Secondary | ICD-10-CM

## 2022-10-13 DIAGNOSIS — E669 Obesity, unspecified: Secondary | ICD-10-CM | POA: Diagnosis present

## 2022-10-13 DIAGNOSIS — Z9181 History of falling: Secondary | ICD-10-CM

## 2022-10-13 DIAGNOSIS — E119 Type 2 diabetes mellitus without complications: Secondary | ICD-10-CM | POA: Diagnosis present

## 2022-10-13 DIAGNOSIS — G992 Myelopathy in diseases classified elsewhere: Secondary | ICD-10-CM | POA: Diagnosis present

## 2022-10-13 DIAGNOSIS — M5414 Radiculopathy, thoracic region: Secondary | ICD-10-CM | POA: Diagnosis present

## 2022-10-13 DIAGNOSIS — Z79899 Other long term (current) drug therapy: Secondary | ICD-10-CM

## 2022-10-13 DIAGNOSIS — K5900 Constipation, unspecified: Secondary | ICD-10-CM | POA: Diagnosis not present

## 2022-10-13 DIAGNOSIS — R32 Unspecified urinary incontinence: Secondary | ICD-10-CM

## 2022-10-13 DIAGNOSIS — Z79620 Long term (current) use of immunosuppressive biologic: Secondary | ICD-10-CM

## 2022-10-13 DIAGNOSIS — Z888 Allergy status to other drugs, medicaments and biological substances status: Secondary | ICD-10-CM

## 2022-10-13 DIAGNOSIS — E7800 Pure hypercholesterolemia, unspecified: Secondary | ICD-10-CM | POA: Diagnosis present

## 2022-10-13 DIAGNOSIS — M5415 Radiculopathy, thoracolumbar region: Secondary | ICD-10-CM | POA: Diagnosis present

## 2022-10-13 LAB — COMPREHENSIVE METABOLIC PANEL
ALT: 33 U/L (ref 0–44)
AST: 30 U/L (ref 15–41)
Albumin: 4 g/dL (ref 3.5–5.0)
Alkaline Phosphatase: 35 U/L — ABNORMAL LOW (ref 38–126)
Anion gap: 8 (ref 5–15)
BUN: 18 mg/dL (ref 8–23)
CO2: 24 mmol/L (ref 22–32)
Calcium: 8.7 mg/dL — ABNORMAL LOW (ref 8.9–10.3)
Chloride: 100 mmol/L (ref 98–111)
Creatinine, Ser: 1.23 mg/dL (ref 0.61–1.24)
GFR, Estimated: >60 mL/min (ref >60)
Glucose, Bld: 158 mg/dL — ABNORMAL HIGH (ref 70–99)
Potassium: 4.1 mmol/L (ref 3.5–5.1)
Sodium: 132 mmol/L — ABNORMAL LOW (ref 135–145)
Total Bilirubin: 0.9 mg/dL (ref 0.3–1.2)
Total Protein: 7.3 g/dL (ref 6.5–8.1)

## 2022-10-13 LAB — CBC WITH DIFFERENTIAL/PLATELET
Abs Immature Granulocytes: 0.03 10*3/uL (ref 0.00–0.07)
Basophils Absolute: 0 10*3/uL (ref 0.0–0.1)
Basophils Relative: 0 %
Eosinophils Absolute: 0.1 10*3/uL (ref 0.0–0.5)
Eosinophils Relative: 1 %
HCT: 43.5 % (ref 39.0–52.0)
Hemoglobin: 15.1 g/dL (ref 13.0–17.0)
Immature Granulocytes: 0 %
Lymphocytes Relative: 21 %
Lymphs Abs: 2.1 10*3/uL (ref 0.7–4.0)
MCH: 32.1 pg (ref 26.0–34.0)
MCHC: 34.7 g/dL (ref 30.0–36.0)
MCV: 92.6 fL (ref 80.0–100.0)
Monocytes Absolute: 0.7 10*3/uL (ref 0.1–1.0)
Monocytes Relative: 6 %
Neutro Abs: 7.3 10*3/uL (ref 1.7–7.7)
Neutrophils Relative %: 72 %
Platelets: 252 10*3/uL (ref 150–400)
RBC: 4.7 MIL/uL (ref 4.22–5.81)
RDW: 12.5 % (ref 11.5–15.5)
WBC: 10.2 10*3/uL (ref 4.0–10.5)
nRBC: 0 % (ref 0.0–0.2)

## 2022-10-13 MED ORDER — DIAZEPAM 5 MG/ML IJ SOLN
INTRAMUSCULAR | Status: AC
  Filled 2022-10-13: qty 2

## 2022-10-13 MED ORDER — DIAZEPAM 5 MG/ML IJ SOLN
5.0000 mg | Freq: Once | INTRAMUSCULAR | Status: AC | PRN
  Administered 2022-10-13: 5 mg via INTRAVENOUS
  Filled 2022-10-13: qty 2

## 2022-10-13 MED ORDER — HYDROMORPHONE HCL 1 MG/ML IJ SOLN
1.0000 mg | Freq: Once | INTRAMUSCULAR | Status: AC
  Administered 2022-10-13: 1 mg via INTRAVENOUS
  Filled 2022-10-13: qty 1

## 2022-10-13 NOTE — ED Notes (Signed)
Report called to [facility] ED charge

## 2022-10-13 NOTE — ED Notes (Signed)
Patient transported to MRI 

## 2022-10-13 NOTE — ED Provider Notes (Signed)
Mitchell Morgan Provider Note   CSN: [id number] Arrival date & time: 10/13/22  2017     History  Chief Complaint  Patient presents with   Back Pain   Numbness    Mitchell Morgan is a 77 y.o. male history of arthritis, here presenting with trouble urinating and incontinence.  Patient states that he has been having mid back pain for the last month or so.  Patient also noticed more falls and increasing unsteady gait.  Patient states that for the last week or so, he cannot feel when he urinates or have bowel movements.  He states that he saw [facility] last week for his knee and was told that he may have some back problems.  He was told that he needs a MRI of the cervical and thoracic spine.  Patient states that today he noticed worsening trouble walking.  He states that he is unable to walk without assistance.  He also has worsening numbness in his groin area.  The history is provided by the patient.       Home Medications Prior to Admission medications   Not on File      Allergies    Shrimp [shellfish allergy]    Review of Systems   Review of Systems  Musculoskeletal:  Positive for back pain.  All other systems reviewed and are negative.   Physical Exam Updated Vital Signs BP (!) 142/75   Pulse 74   Temp 98.1 F (36.7 C) (Oral)   Resp 15   Ht 5\' 10"  (1.778 m)   Wt 97.1 kg   SpO2 97%   BMI 30.71 kg/m  Physical Exam Vitals and nursing note reviewed.  Constitutional:      Comments: Uncomfortable  HENT:     Head: Normocephalic.     Nose: Nose normal.     Mouth/Throat:     Mouth: Mucous membranes are moist.  Eyes:     Extraocular Movements: Extraocular movements intact.     Pupils: Pupils are equal, round, and reactive to light.  Cardiovascular:     Rate and Rhythm: Normal rate and regular rhythm.     Pulses: Normal pulses.     Heart sounds: Normal heart sounds.  Pulmonary:     Effort: Pulmonary effort is normal.      Breath sounds: Normal breath sounds.  Abdominal:     General: Abdomen is flat.     Palpations: Abdomen is soft.  Musculoskeletal:     Cervical back: Normal range of motion and neck supple.  Skin:    General: Skin is warm.     Capillary Refill: Capillary refill takes less than 2 seconds.  Neurological:     Mental Status: He is alert.     Comments: Patient has some mild saddle anesthesia and decreased sensation inside of bilateral thighs.  Patient has normal knee reflexes.  Patient has a questionable sensory level around the pelvic area (T12).  Patient has motor strength 3 out of 5 bilateral lower extremities.  Patient's motor strength is 5 of 5 bilateral arms.     ED Results / Procedures / Treatments   Labs (all labs ordered are listed, but only abnormal results are displayed) Labs Reviewed  CBC WITH DIFFERENTIAL/PLATELET  COMPREHENSIVE METABOLIC PANEL    EKG None  Radiology No results found.  Procedures Procedures    Medications Ordered in ED Medications  HYDROmorphone (DILAUDID) injection 1 mg (has no administration in time range)  diazepam (  VALIUM) injection 5 mg (has no administration in time range)    ED Course/ Medical Decision Making/ A&P                             Medical Decision Making Mitchell Morgan is a 77 y.o. male here presenting with incontinence and saddle anesthesia.  This is progressive over the last month and over the last week it got worse and since yesterday he has been falling.  He has a questionable sensory level at T12.  I am concerned for cauda equina syndrome.  I ordered MRI of the thoracic and lumbar spine.  He was told that he needs an MRI of the cervical spine but his orthopedic doctor.  Patient will need medicines for claustrophobia for MRI.  9:44 PM I was told by MRI at [facility] that they are unable to get to him tonight.  I discussed case with Dr. Particia Nearing at [facility] and she will be accepting doctor.  Anticipate that patient will need  neurosurgery consult after MRI results.  Patient will be brought over to come by private vehicle.   Problems Addressed: Urinary incontinence, unspecified type: acute illness or injury Weakness of both lower extremities: acute illness or injury  Amount and/or Complexity of Data Reviewed Labs: ordered. Radiology: ordered.  Risk Prescription drug management.    Final Clinical Impression(s) / ED Diagnoses Final diagnoses:  None    Rx / DC Orders ED Discharge Orders     None         Charlynne Pander, MD 10/13/22 2145

## 2022-10-13 NOTE — ED Notes (Signed)
Bladder scanned pt, pt has 94 mL of urine retained in bladder.

## 2022-10-13 NOTE — ED Triage Notes (Signed)
Pt arrives c./o lower back pain and numbness from hips down to feet. Denies incontinence, but states that he cannot feel it when he urinates or has a bowel movement x3 days. Has not been able to ambulate normally/unassisted since 5/8. Symptoms have progressively worsened, and significantly worse today. Denies injury. Frequent falls lately due to legs giving out.  Saw [facility] last week for follow up on knee and MRI was ordered, but was cancelled due to not having prior authorization.

## 2022-10-14 ENCOUNTER — Other Ambulatory Visit: Payer: Self-pay | Admitting: Neurosurgery

## 2022-10-14 DIAGNOSIS — R32 Unspecified urinary incontinence: Secondary | ICD-10-CM | POA: Diagnosis present

## 2022-10-14 DIAGNOSIS — Z6831 Body mass index (BMI) 31.0-31.9, adult: Secondary | ICD-10-CM | POA: Diagnosis not present

## 2022-10-14 DIAGNOSIS — K5900 Constipation, unspecified: Secondary | ICD-10-CM | POA: Diagnosis not present

## 2022-10-14 DIAGNOSIS — E538 Deficiency of other specified B group vitamins: Secondary | ICD-10-CM | POA: Diagnosis present

## 2022-10-14 DIAGNOSIS — Z79620 Long term (current) use of immunosuppressive biologic: Secondary | ICD-10-CM | POA: Diagnosis not present

## 2022-10-14 DIAGNOSIS — I10 Essential (primary) hypertension: Secondary | ICD-10-CM | POA: Diagnosis present

## 2022-10-14 DIAGNOSIS — Z7982 Long term (current) use of aspirin: Secondary | ICD-10-CM | POA: Diagnosis not present

## 2022-10-14 DIAGNOSIS — R29898 Other symptoms and signs involving the musculoskeletal system: Secondary | ICD-10-CM | POA: Diagnosis present

## 2022-10-14 DIAGNOSIS — E669 Obesity, unspecified: Secondary | ICD-10-CM | POA: Diagnosis present

## 2022-10-14 DIAGNOSIS — M5415 Radiculopathy, thoracolumbar region: Secondary | ICD-10-CM | POA: Diagnosis present

## 2022-10-14 DIAGNOSIS — G992 Myelopathy in diseases classified elsewhere: Secondary | ICD-10-CM | POA: Diagnosis present

## 2022-10-14 DIAGNOSIS — M4804 Spinal stenosis, thoracic region: Secondary | ICD-10-CM | POA: Diagnosis present

## 2022-10-14 DIAGNOSIS — E119 Type 2 diabetes mellitus without complications: Secondary | ICD-10-CM | POA: Diagnosis present

## 2022-10-14 DIAGNOSIS — Z79899 Other long term (current) drug therapy: Secondary | ICD-10-CM | POA: Diagnosis not present

## 2022-10-14 DIAGNOSIS — K649 Unspecified hemorrhoids: Secondary | ICD-10-CM | POA: Diagnosis not present

## 2022-10-14 DIAGNOSIS — Z9181 History of falling: Secondary | ICD-10-CM | POA: Diagnosis not present

## 2022-10-14 DIAGNOSIS — Z888 Allergy status to other drugs, medicaments and biological substances status: Secondary | ICD-10-CM | POA: Diagnosis not present

## 2022-10-14 DIAGNOSIS — E7800 Pure hypercholesterolemia, unspecified: Secondary | ICD-10-CM | POA: Diagnosis present

## 2022-10-14 DIAGNOSIS — M5414 Radiculopathy, thoracic region: Secondary | ICD-10-CM | POA: Diagnosis present

## 2022-10-14 DIAGNOSIS — Z91013 Allergy to seafood: Secondary | ICD-10-CM | POA: Diagnosis not present

## 2022-10-14 LAB — VITAMIN B12: Vitamin B-12: 195 pg/mL (ref 180–914)

## 2022-10-14 LAB — GLUCOSE, CAPILLARY
Glucose-Capillary: 137 mg/dL — ABNORMAL HIGH (ref 70–99)
Glucose-Capillary: 153 mg/dL — ABNORMAL HIGH (ref 70–99)
Glucose-Capillary: 157 mg/dL — ABNORMAL HIGH (ref 70–99)
Glucose-Capillary: 221 mg/dL — ABNORMAL HIGH (ref 70–99)

## 2022-10-14 LAB — PHOSPHORUS: Phosphorus: 2.7 mg/dL (ref 2.5–4.6)

## 2022-10-14 LAB — MAGNESIUM: Magnesium: 2.2 mg/dL (ref 1.7–2.4)

## 2022-10-14 LAB — TSH: TSH: 0.414 u[IU]/mL (ref 0.350–4.500)

## 2022-10-14 MED ORDER — DOCUSATE SODIUM 100 MG PO CAPS
100.0000 mg | ORAL_CAPSULE | Freq: Two times a day (BID) | ORAL | Status: DC
  Administered 2022-10-14 (×2): 100 mg via ORAL
  Filled 2022-10-14 (×2): qty 1

## 2022-10-14 MED ORDER — POLYETHYLENE GLYCOL 3350 17 G PO PACK
17.0000 g | PACK | Freq: Every day | ORAL | Status: DC
  Administered 2022-10-14 – 2022-10-22 (×5): 17 g via ORAL
  Filled 2022-10-14 (×8): qty 1

## 2022-10-14 MED ORDER — ATORVASTATIN CALCIUM 10 MG PO TABS
20.0000 mg | ORAL_TABLET | Freq: Every day | ORAL | Status: DC
  Administered 2022-10-14 – 2022-10-22 (×9): 20 mg via ORAL
  Filled 2022-10-14 (×9): qty 2

## 2022-10-14 MED ORDER — ACETAMINOPHEN 325 MG PO TABS
650.0000 mg | ORAL_TABLET | Freq: Four times a day (QID) | ORAL | Status: DC | PRN
  Administered 2022-10-14 (×2): 650 mg via ORAL
  Filled 2022-10-14 (×2): qty 2

## 2022-10-14 MED ORDER — DEXAMETHASONE SODIUM PHOSPHATE 4 MG/ML IJ SOLN
4.0000 mg | Freq: Four times a day (QID) | INTRAMUSCULAR | Status: DC
  Administered 2022-10-14 – 2022-10-20 (×24): 4 mg via INTRAVENOUS
  Filled 2022-10-14 (×26): qty 1

## 2022-10-14 MED ORDER — BISACODYL 10 MG RE SUPP
10.0000 mg | Freq: Every day | RECTAL | Status: DC | PRN
  Administered 2022-10-16: 10 mg via RECTAL

## 2022-10-14 MED ORDER — ASPIRIN 81 MG PO TBEC
81.0000 mg | DELAYED_RELEASE_TABLET | Freq: Every day | ORAL | Status: DC
  Administered 2022-10-14 – 2022-10-22 (×9): 81 mg via ORAL
  Filled 2022-10-14 (×9): qty 1

## 2022-10-14 MED ORDER — ENOXAPARIN SODIUM 40 MG/0.4ML IJ SOSY: 40.0000 mg | PREFILLED_SYRINGE | INTRAMUSCULAR | Status: DC

## 2022-10-14 MED ORDER — ATENOLOL 25 MG PO TABS
50.0000 mg | ORAL_TABLET | Freq: Every day | ORAL | Status: DC
  Administered 2022-10-14 – 2022-10-22 (×9): 50 mg via ORAL
  Filled 2022-10-14 (×9): qty 2

## 2022-10-14 MED ORDER — DICLOFENAC SODIUM 1 % EX GEL
2.0000 g | Freq: Two times a day (BID) | CUTANEOUS | Status: DC
  Administered 2022-10-14 – 2022-10-22 (×15): 2 g via TOPICAL
  Filled 2022-10-14 (×2): qty 100

## 2022-10-14 MED ORDER — VITAMIN D 25 MCG (1000 UNIT) PO TABS
1000.0000 [IU] | ORAL_TABLET | Freq: Every day | ORAL | Status: DC
  Administered 2022-10-14 – 2022-10-22 (×9): 1000 [IU] via ORAL
  Filled 2022-10-14 (×9): qty 1

## 2022-10-14 MED ORDER — HYDROMORPHONE HCL 1 MG/ML IJ SOLN: 0.5000 mg | INTRAMUSCULAR | Status: DC | PRN

## 2022-10-14 MED ORDER — DEXAMETHASONE SODIUM PHOSPHATE 10 MG/ML IJ SOLN
10.0000 mg | Freq: Once | INTRAMUSCULAR | Status: AC
  Administered 2022-10-14: 10 mg via INTRAVENOUS
  Filled 2022-10-14: qty 1

## 2022-10-14 MED ORDER — ZOLPIDEM TARTRATE 5 MG PO TABS
10.0000 mg | ORAL_TABLET | Freq: Every evening | ORAL | Status: DC | PRN
  Administered 2022-10-14 – 2022-10-21 (×8): 10 mg via ORAL
  Filled 2022-10-14 (×8): qty 2

## 2022-10-14 MED ORDER — AMLODIPINE BESYLATE 10 MG PO TABS
10.0000 mg | ORAL_TABLET | Freq: Every day | ORAL | Status: DC
  Administered 2022-10-14 – 2022-10-22 (×9): 10 mg via ORAL
  Filled 2022-10-14 (×9): qty 1

## 2022-10-14 NOTE — Plan of Care (Signed)
  Problem: Education: Goal: Knowledge of General Education information will improve Description Including pain rating scale, medication(s)/side effects and non-pharmacologic comfort measures Outcome: Progressing   Problem: Health Behavior/Discharge Planning: Goal: Ability to manage health-related needs will improve Outcome: Progressing   

## 2022-10-14 NOTE — ED Provider Notes (Signed)
Sent from [facility] due to progressively worsening weakness of his lower extremity, frequent falls, and new saddle anesthesia and some incontinence issues today.  Ortho had attempted outpatient MRI but canceled due to no prior authorization.  Plan: MRI C/T/L-spine.  Follow results, likely neurosurgery consultation.  Results for orders placed or performed during the hospital encounter of 10/13/22  CBC with Differential  Result Value Ref Range   WBC 10.2 4.0 - 10.5 K/uL   RBC 4.70 4.22 - 5.81 MIL/uL   Hemoglobin 15.1 13.0 - 17.0 g/dL   HCT 96.0 45.4 - 09.8 %   MCV 92.6 80.0 - 100.0 fL   MCH 32.1 26.0 - 34.0 pg   MCHC 34.7 30.0 - 36.0 g/dL   RDW 11.9 14.7 - 82.9 %   Platelets 252 150 - 400 K/uL   nRBC 0.0 0.0 - 0.2 %   Neutrophils Relative % 72 %   Neutro Abs 7.3 1.7 - 7.7 K/uL   Lymphocytes Relative 21 %   Lymphs Abs 2.1 0.7 - 4.0 K/uL   Monocytes Relative 6 %   Monocytes Absolute 0.7 0.1 - 1.0 K/uL   Eosinophils Relative 1 %   Eosinophils Absolute 0.1 0.0 - 0.5 K/uL   Basophils Relative 0 %   Basophils Absolute 0.0 0.0 - 0.1 K/uL   Immature Granulocytes 0 %   Abs Immature Granulocytes 0.03 0.00 - 0.07 K/uL  Comprehensive metabolic panel  Result Value Ref Range   Sodium 132 (L) 135 - 145 mmol/L   Potassium 4.1 3.5 - 5.1 mmol/L   Chloride 100 98 - 111 mmol/L   CO2 24 22 - 32 mmol/L   Glucose, Bld 158 (H) 70 - 99 mg/dL   BUN 18 8 - 23 mg/dL   Creatinine, Ser 5.62 0.61 - 1.24 mg/dL   Calcium 8.7 (L) 8.9 - 10.3 mg/dL   Total Protein 7.3 6.5 - 8.1 g/dL   Albumin 4.0 3.5 - 5.0 g/dL   AST 30 15 - 41 U/L   ALT 33 0 - 44 U/L   Alkaline Phosphatase 35 (L) 38 - 126 U/L   Total Bilirubin 0.9 0.3 - 1.2 mg/dL   GFR, Estimated >13 >08 mL/min   Anion gap 8 5 - 15   MR LUMBAR SPINE WO CONTRAST  Result Date: 10/14/2022 CLINICAL DATA:  Back pain, trouble urinating, incontinence, difficulty walking EXAM: MRI CERVICAL, THORACIC AND LUMBAR SPINE WITHOUT CONTRAST TECHNIQUE: Multiplanar and  multiecho pulse sequences of the cervical spine, to include the craniocervical junction and cervicothoracic junction, and thoracic and lumbar spine, were obtained without intravenous contrast. COMPARISON:  None Available. FINDINGS: MRI CERVICAL SPINE FINDINGS Alignment: 2 mm anterolisthesis of C2 on C3 and C3 on C4. 3 mm anterolisthesis of C4 on C5. 3 mm retrolisthesis of C5 on C6. Vertebrae: No acute fracture, evidence of discitis, or suspicious osseous lesion. Osseous fusion across the C5-C6 disc space. Significant degenerative changes at the [facility] dental interval with pannus formation posterior to the dens. Cord: Normal signal and morphology. Posterior Fossa, vertebral arteries, paraspinal tissues: Partial empty sella. Normal craniocervical junction. Normal vertebral artery flow voids. Disc levels: C1-C2: Seen only on the sagittal images. Significant pannus formation posterior to the dens. Ligamentum flavum hypertrophy. Mild-to-moderate spinal canal stenosis. C2-C3: Anterolisthesis with disc unroofing and mild disc bulge. Facet and uncovertebral hypertrophy. Mild spinal canal stenosis. Moderate to severe bilateral neural foraminal narrowing. C3-C4: Trace anterolisthesis with disc unroofing and moderate disc bulge. Facet and uncovertebral hypertrophy. Moderate to severe spinal  canal stenosis. Severe bilateral neural foraminal narrowing. C4-C5: Anterolisthesis with disc unroofing. Facet and uncovertebral hypertrophy. Severe spinal canal stenosis. Moderate right and severe left neural foraminal narrowing. C5-C6: Retrolisthesis with fusion across the disc space. Facet and uncovertebral hypertrophy. No spinal canal stenosis. Mild right neural foraminal narrowing. C6-C7: Mild disc bulge. Facet and uncovertebral hypertrophy. Mild spinal canal stenosis. Mild bilateral neural foraminal narrowing. C7-T1: Mild disc bulge. Facet and uncovertebral hypertrophy. No spinal canal stenosis. Mild bilateral neural foraminal  narrowing. MRI THORACIC SPINE FINDINGS Evaluation is somewhat limited by the atypical protocol used. Alignment: Trace anterolisthesis of T1 on T2 and T2 on T3. Trace retrolisthesis of T12 on L1. Preservation of the normal thoracic kyphosis. Vertebrae: No acute fracture or suspicious osseous lesion. Cord:  Normal signal and morphology. Paraspinal and other soft tissues: Limited due to respiratory motion. No definite pleural effusion or pulmonary opacity. Disc levels: T3-T4: Facet arthropathy. No significant spinal canal stenosis. Mild right neural foraminal T4-T5: Facet arthropathy. No spinal canal stenosis. Mild right neural foraminal narrowing. T9-T10: Disc height loss. Facet arthropathy. No significant spinal canal stenosis. Mild left and moderate right neural foraminal narrowing. T10-T11: Disc height loss with moderate disc osteophyte complex. Facet arthropathy. Moderate spinal canal stenosis. Moderate to severe left and moderate right neural foraminal narrowing. T11-T12: Disc height loss with moderate disc osteophyte complex. Facet arthropathy. Moderate spinal canal stenosis. Mild bilateral neural foraminal narrowing. MRI LUMBAR SPINE FINDINGS Segmentation:  5 lumbar type vertebral bodies. Alignment: Trace retrolisthesis of T12 on L1, L1 on L2, and L5 on S1. Preservation of the normal lumbar lordosis. Vertebrae: No acute fracture, evidence of discitis, or suspicious osseous lesion. T1 and T2 hyperintense lesion in the L1 vertebral body, most likely a benign hemangioma. Conus medullaris and cauda equina: Conus extends to the L1-L2 level. Conus and cauda equina appear normal. Paraspinal and other soft tissues: Left renal cyst, for which no follow-up currently indicated. No lymphadenopathy. Disc levels: T12-L1: Disc height loss and trace retrolisthesis with mild disc osteophyte complex. Mild facet arthropathy. No spinal canal stenosis. Mild right neural foraminal narrowing. L1-L2: Trace retrolisthesis with mild  disc bulge and disc osteophyte complex. Mild-to-moderate facet arthropathy. Narrowing of the right-greater-than-left lateral recess. Mild spinal canal stenosis. Mild-to-moderate right neural foraminal narrowing. L2-L3: Mild disc bulge. Mild-to-moderate facet arthropathy. Narrowing of the lateral recesses. Moderate spinal canal stenosis. No significant neural foraminal narrowing. L3-L4: Mild disc bulge with right foraminal protrusion and central annular fissure. Moderate facet arthropathy. Ligamentum flavum hypertrophy. Narrowing of the lateral recesses. Moderate spinal canal stenosis. Mild left neural foraminal narrowing. L4-L5: Mild disc bulge. Moderate facet arthropathy. Ligamentum flavum hypertrophy. Narrowing of the lateral recesses. No spinal canal stenosis or neural foraminal narrowing. L5-S1: Trace retrolisthesis and disc height loss with mild disc bulge with superimposed right subarticular disc protrusion with annular fissure. Additional left paracentral annular fissure. This likely contacts the descending right S1 nerve roots. Mild facet arthropathy. No spinal canal stenosis. Moderate bilateral neural foraminal narrowing. IMPRESSION: CERVICAL SPINE: 1. C4-C5 severe spinal canal stenosis with moderate right and severe left neural foraminal narrowing. 2. C3-C4 moderate to severe spinal canal stenosis and severe bilateral neural foraminal narrowing. 3. C2-C3 mild spinal canal stenosis and moderate to severe bilateral neural foraminal narrowing. 4. C1-C2 mild-to-moderate spinal canal stenosis, secondary to pannus formation posterior to the dens and ligamentum flavum hypertrophy. THORACIC SPINE: 1. T10-T11 moderate spinal canal stenosis with moderate to severe left and moderate right neural foraminal narrowing. 2. T11-T12 moderate spinal canal stenosis and mild  bilateral neural foraminal narrowing. LUMBAR SPINE: 1. L2-L3 and L3-L4 moderate spinal canal stenosis and mild left neural foraminal narrowing. 2. L1-L2  mild spinal canal stenosis and mild-to-moderate right neural foraminal narrowing. 3. L5-S1 moderate bilateral neural foraminal narrowing. In addition a right subarticular disc protrusion and annular fissure likely contacts the descending right S1 nerve roots. 4. Narrowing of the lateral recesses throughout the lumbar spine, which can affect the descending nerve roots at each level. Electronically Signed   By: Wiliam Ke M.D.   On: 10/14/2022 01:52   MR THORACIC SPINE WO CONTRAST  Result Date: 10/14/2022 CLINICAL DATA:  Back pain, trouble urinating, incontinence, difficulty walking EXAM: MRI CERVICAL, THORACIC AND LUMBAR SPINE WITHOUT CONTRAST TECHNIQUE: Multiplanar and multiecho pulse sequences of the cervical spine, to include the craniocervical junction and cervicothoracic junction, and thoracic and lumbar spine, were obtained without intravenous contrast. COMPARISON:  None Available. FINDINGS: MRI CERVICAL SPINE FINDINGS Alignment: 2 mm anterolisthesis of C2 on C3 and C3 on C4. 3 mm anterolisthesis of C4 on C5. 3 mm retrolisthesis of C5 on C6. Vertebrae: No acute fracture, evidence of discitis, or suspicious osseous lesion. Osseous fusion across the C5-C6 disc space. Significant degenerative changes at the [facility] dental interval with pannus formation posterior to the dens. Cord: Normal signal and morphology. Posterior Fossa, vertebral arteries, paraspinal tissues: Partial empty sella. Normal craniocervical junction. Normal vertebral artery flow voids. Disc levels: C1-C2: Seen only on the sagittal images. Significant pannus formation posterior to the dens. Ligamentum flavum hypertrophy. Mild-to-moderate spinal canal stenosis. C2-C3: Anterolisthesis with disc unroofing and mild disc bulge. Facet and uncovertebral hypertrophy. Mild spinal canal stenosis. Moderate to severe bilateral neural foraminal narrowing. C3-C4: Trace anterolisthesis with disc unroofing and moderate disc bulge. Facet and uncovertebral  hypertrophy. Moderate to severe spinal canal stenosis. Severe bilateral neural foraminal narrowing. C4-C5: Anterolisthesis with disc unroofing. Facet and uncovertebral hypertrophy. Severe spinal canal stenosis. Moderate right and severe left neural foraminal narrowing. C5-C6: Retrolisthesis with fusion across the disc space. Facet and uncovertebral hypertrophy. No spinal canal stenosis. Mild right neural foraminal narrowing. C6-C7: Mild disc bulge. Facet and uncovertebral hypertrophy. Mild spinal canal stenosis. Mild bilateral neural foraminal narrowing. C7-T1: Mild disc bulge. Facet and uncovertebral hypertrophy. No spinal canal stenosis. Mild bilateral neural foraminal narrowing. MRI THORACIC SPINE FINDINGS Evaluation is somewhat limited by the atypical protocol used. Alignment: Trace anterolisthesis of T1 on T2 and T2 on T3. Trace retrolisthesis of T12 on L1. Preservation of the normal thoracic kyphosis. Vertebrae: No acute fracture or suspicious osseous lesion. Cord:  Normal signal and morphology. Paraspinal and other soft tissues: Limited due to respiratory motion. No definite pleural effusion or pulmonary opacity. Disc levels: T3-T4: Facet arthropathy. No significant spinal canal stenosis. Mild right neural foraminal T4-T5: Facet arthropathy. No spinal canal stenosis. Mild right neural foraminal narrowing. T9-T10: Disc height loss. Facet arthropathy. No significant spinal canal stenosis. Mild left and moderate right neural foraminal narrowing. T10-T11: Disc height loss with moderate disc osteophyte complex. Facet arthropathy. Moderate spinal canal stenosis. Moderate to severe left and moderate right neural foraminal narrowing. T11-T12: Disc height loss with moderate disc osteophyte complex. Facet arthropathy. Moderate spinal canal stenosis. Mild bilateral neural foraminal narrowing. MRI LUMBAR SPINE FINDINGS Segmentation:  5 lumbar type vertebral bodies. Alignment: Trace retrolisthesis of T12 on L1, L1 on L2,  and L5 on S1. Preservation of the normal lumbar lordosis. Vertebrae: No acute fracture, evidence of discitis, or suspicious osseous lesion. T1 and T2 hyperintense lesion in the L1 vertebral body, most  likely a benign hemangioma. Conus medullaris and cauda equina: Conus extends to the L1-L2 level. Conus and cauda equina appear normal. Paraspinal and other soft tissues: Left renal cyst, for which no follow-up currently indicated. No lymphadenopathy. Disc levels: T12-L1: Disc height loss and trace retrolisthesis with mild disc osteophyte complex. Mild facet arthropathy. No spinal canal stenosis. Mild right neural foraminal narrowing. L1-L2: Trace retrolisthesis with mild disc bulge and disc osteophyte complex. Mild-to-moderate facet arthropathy. Narrowing of the right-greater-than-left lateral recess. Mild spinal canal stenosis. Mild-to-moderate right neural foraminal narrowing. L2-L3: Mild disc bulge. Mild-to-moderate facet arthropathy. Narrowing of the lateral recesses. Moderate spinal canal stenosis. No significant neural foraminal narrowing. L3-L4: Mild disc bulge with right foraminal protrusion and central annular fissure. Moderate facet arthropathy. Ligamentum flavum hypertrophy. Narrowing of the lateral recesses. Moderate spinal canal stenosis. Mild left neural foraminal narrowing. L4-L5: Mild disc bulge. Moderate facet arthropathy. Ligamentum flavum hypertrophy. Narrowing of the lateral recesses. No spinal canal stenosis or neural foraminal narrowing. L5-S1: Trace retrolisthesis and disc height loss with mild disc bulge with superimposed right subarticular disc protrusion with annular fissure. Additional left paracentral annular fissure. This likely contacts the descending right S1 nerve roots. Mild facet arthropathy. No spinal canal stenosis. Moderate bilateral neural foraminal narrowing. IMPRESSION: CERVICAL SPINE: 1. C4-C5 severe spinal canal stenosis with moderate right and severe left neural foraminal  narrowing. 2. C3-C4 moderate to severe spinal canal stenosis and severe bilateral neural foraminal narrowing. 3. C2-C3 mild spinal canal stenosis and moderate to severe bilateral neural foraminal narrowing. 4. C1-C2 mild-to-moderate spinal canal stenosis, secondary to pannus formation posterior to the dens and ligamentum flavum hypertrophy. THORACIC SPINE: 1. T10-T11 moderate spinal canal stenosis with moderate to severe left and moderate right neural foraminal narrowing. 2. T11-T12 moderate spinal canal stenosis and mild bilateral neural foraminal narrowing. LUMBAR SPINE: 1. L2-L3 and L3-L4 moderate spinal canal stenosis and mild left neural foraminal narrowing. 2. L1-L2 mild spinal canal stenosis and mild-to-moderate right neural foraminal narrowing. 3. L5-S1 moderate bilateral neural foraminal narrowing. In addition a right subarticular disc protrusion and annular fissure likely contacts the descending right S1 nerve roots. 4. Narrowing of the lateral recesses throughout the lumbar spine, which can affect the descending nerve roots at each level. Electronically Signed   By: Wiliam Ke M.D.   On: 10/14/2022 01:52   MR Cervical Spine Wo Contrast  Result Date: 10/14/2022 CLINICAL DATA:  Back pain, trouble urinating, incontinence, difficulty walking EXAM: MRI CERVICAL, THORACIC AND LUMBAR SPINE WITHOUT CONTRAST TECHNIQUE: Multiplanar and multiecho pulse sequences of the cervical spine, to include the craniocervical junction and cervicothoracic junction, and thoracic and lumbar spine, were obtained without intravenous contrast. COMPARISON:  None Available. FINDINGS: MRI CERVICAL SPINE FINDINGS Alignment: 2 mm anterolisthesis of C2 on C3 and C3 on C4. 3 mm anterolisthesis of C4 on C5. 3 mm retrolisthesis of C5 on C6. Vertebrae: No acute fracture, evidence of discitis, or suspicious osseous lesion. Osseous fusion across the C5-C6 disc space. Significant degenerative changes at the [facility] dental interval with  pannus formation posterior to the dens. Cord: Normal signal and morphology. Posterior Fossa, vertebral arteries, paraspinal tissues: Partial empty sella. Normal craniocervical junction. Normal vertebral artery flow voids. Disc levels: C1-C2: Seen only on the sagittal images. Significant pannus formation posterior to the dens. Ligamentum flavum hypertrophy. Mild-to-moderate spinal canal stenosis. C2-C3: Anterolisthesis with disc unroofing and mild disc bulge. Facet and uncovertebral hypertrophy. Mild spinal canal stenosis. Moderate to severe bilateral neural foraminal narrowing. C3-C4: Trace anterolisthesis with disc unroofing and  moderate disc bulge. Facet and uncovertebral hypertrophy. Moderate to severe spinal canal stenosis. Severe bilateral neural foraminal narrowing. C4-C5: Anterolisthesis with disc unroofing. Facet and uncovertebral hypertrophy. Severe spinal canal stenosis. Moderate right and severe left neural foraminal narrowing. C5-C6: Retrolisthesis with fusion across the disc space. Facet and uncovertebral hypertrophy. No spinal canal stenosis. Mild right neural foraminal narrowing. C6-C7: Mild disc bulge. Facet and uncovertebral hypertrophy. Mild spinal canal stenosis. Mild bilateral neural foraminal narrowing. C7-T1: Mild disc bulge. Facet and uncovertebral hypertrophy. No spinal canal stenosis. Mild bilateral neural foraminal narrowing. MRI THORACIC SPINE FINDINGS Evaluation is somewhat limited by the atypical protocol used. Alignment: Trace anterolisthesis of T1 on T2 and T2 on T3. Trace retrolisthesis of T12 on L1. Preservation of the normal thoracic kyphosis. Vertebrae: No acute fracture or suspicious osseous lesion. Cord:  Normal signal and morphology. Paraspinal and other soft tissues: Limited due to respiratory motion. No definite pleural effusion or pulmonary opacity. Disc levels: T3-T4: Facet arthropathy. No significant spinal canal stenosis. Mild right neural foraminal T4-T5: Facet  arthropathy. No spinal canal stenosis. Mild right neural foraminal narrowing. T9-T10: Disc height loss. Facet arthropathy. No significant spinal canal stenosis. Mild left and moderate right neural foraminal narrowing. T10-T11: Disc height loss with moderate disc osteophyte complex. Facet arthropathy. Moderate spinal canal stenosis. Moderate to severe left and moderate right neural foraminal narrowing. T11-T12: Disc height loss with moderate disc osteophyte complex. Facet arthropathy. Moderate spinal canal stenosis. Mild bilateral neural foraminal narrowing. MRI LUMBAR SPINE FINDINGS Segmentation:  5 lumbar type vertebral bodies. Alignment: Trace retrolisthesis of T12 on L1, L1 on L2, and L5 on S1. Preservation of the normal lumbar lordosis. Vertebrae: No acute fracture, evidence of discitis, or suspicious osseous lesion. T1 and T2 hyperintense lesion in the L1 vertebral body, most likely a benign hemangioma. Conus medullaris and cauda equina: Conus extends to the L1-L2 level. Conus and cauda equina appear normal. Paraspinal and other soft tissues: Left renal cyst, for which no follow-up currently indicated. No lymphadenopathy. Disc levels: T12-L1: Disc height loss and trace retrolisthesis with mild disc osteophyte complex. Mild facet arthropathy. No spinal canal stenosis. Mild right neural foraminal narrowing. L1-L2: Trace retrolisthesis with mild disc bulge and disc osteophyte complex. Mild-to-moderate facet arthropathy. Narrowing of the right-greater-than-left lateral recess. Mild spinal canal stenosis. Mild-to-moderate right neural foraminal narrowing. L2-L3: Mild disc bulge. Mild-to-moderate facet arthropathy. Narrowing of the lateral recesses. Moderate spinal canal stenosis. No significant neural foraminal narrowing. L3-L4: Mild disc bulge with right foraminal protrusion and central annular fissure. Moderate facet arthropathy. Ligamentum flavum hypertrophy. Narrowing of the lateral recesses. Moderate spinal  canal stenosis. Mild left neural foraminal narrowing. L4-L5: Mild disc bulge. Moderate facet arthropathy. Ligamentum flavum hypertrophy. Narrowing of the lateral recesses. No spinal canal stenosis or neural foraminal narrowing. L5-S1: Trace retrolisthesis and disc height loss with mild disc bulge with superimposed right subarticular disc protrusion with annular fissure. Additional left paracentral annular fissure. This likely contacts the descending right S1 nerve roots. Mild facet arthropathy. No spinal canal stenosis. Moderate bilateral neural foraminal narrowing. IMPRESSION: CERVICAL SPINE: 1. C4-C5 severe spinal canal stenosis with moderate right and severe left neural foraminal narrowing. 2. C3-C4 moderate to severe spinal canal stenosis and severe bilateral neural foraminal narrowing. 3. C2-C3 mild spinal canal stenosis and moderate to severe bilateral neural foraminal narrowing. 4. C1-C2 mild-to-moderate spinal canal stenosis, secondary to pannus formation posterior to the dens and ligamentum flavum hypertrophy. THORACIC SPINE: 1. T10-T11 moderate spinal canal stenosis with moderate to severe left and moderate right  neural foraminal narrowing. 2. T11-T12 moderate spinal canal stenosis and mild bilateral neural foraminal narrowing. LUMBAR SPINE: 1. L2-L3 and L3-L4 moderate spinal canal stenosis and mild left neural foraminal narrowing. 2. L1-L2 mild spinal canal stenosis and mild-to-moderate right neural foraminal narrowing. 3. L5-S1 moderate bilateral neural foraminal narrowing. In addition a right subarticular disc protrusion and annular fissure likely contacts the descending right S1 nerve roots. 4. Narrowing of the lateral recesses throughout the lumbar spine, which can affect the descending nerve roots at each level. Electronically Signed   By: Wiliam Ke M.D.   On: 10/14/2022 01:52    MRI's with multi-level disease ranging from mild to severe canal stenosis at various levels.  Patient reassessed, no  change in his symptoms from time of arrival.  He still has some sensation to his legs and still has some muscle tone-- able to transfer from MRI stretcher to ED stretcher, able to lift legs off bed independently, etc.  He denies any frank weakness of his upper extremities.  He has not had any difficulty breathing, swallowing, chewing/eating, etc.  No recent URI/viral symptoms.  Symptoms not really ascending up the body so not really consistent with guillian barre either.  Will discuss with neurosurgery for recommendations.  2:43 AM Spoke with PA Megan with neurosurgery-- we have reviewed MRI's and exam findings.  Agrees multilevel disease but no pinpoint area that should be causing his symptoms.  He has not had any incontinence in the ED.  Post void residual done earlier with only 94mL. Not really clinically consistent with cauda equina either. Recommends to try decadrom 10mg  IV now, then 4mg  IV Q6H.  Can admit to medicine service, neurosurgery to see in the AM for formal consultation.  Patient and family updated, they are agreeable.    Discussed with hospitalist, Dr. Julian Reil-- will admit.  Aware of neurosurgery recommendations and formal consultation pending for this AM.   Garlon Hatchet, PA-C 10/14/22 0422    Nira Conn, MD 10/14/22 [phone]

## 2022-10-14 NOTE — Assessment & Plan Note (Signed)
Cont home BP meds ?

## 2022-10-14 NOTE — ED Notes (Signed)
ED TO INPATIENT HANDOFF REPORT  ED Nurse Name and Phone #: [phone]  S Name/Age/Gender Mitchell Morgan 77 y.o. male Room/Bed: 010C/010C  Code Status   Code Status: Full Code  Home/SNF/Other Home Patient oriented to: self, place, time, and situation Is this baseline? Yes   Triage Complete: Triage complete  Chief Complaint Bilateral leg weakness [R29.898]  Triage Note Pt arrives c./o lower back pain and numbness from hips down to feet. Denies incontinence, but states that he cannot feel it when he urinates or has a bowel movement x3 days. Has not been able to ambulate normally/unassisted since 5/8. Symptoms have progressively worsened, and significantly worse today. Denies injury. Frequent falls lately due to legs giving out.  Saw [facility] last week for follow up on knee and MRI was ordered, but was cancelled due to not having prior authorization.   Allergies Allergies  Allergen Reactions   Pravastatin Other (See Comments)    myalgia   Shrimp [Shellfish Allergy] Swelling    Lobster, crab  Swollen eyes   Simvastatin Other (See Comments)    myalgia    Level of Care/Admitting Diagnosis ED Disposition     ED Disposition  Admit   Condition  --   Comment  Hospital Area: [facility] [[id number]]  Level of Care: Med-Surg [16]  May place patient in observation at [facility] or [facility] if equivalent level of care is available:: No  Covid Evaluation: Asymptomatic - no recent exposure (last 10 days) testing not required  Diagnosis: Bilateral leg weakness [[id number]]  Admitting Physician: Hillary Bow [[id number]  Attending Physician: Hillary Bow [[id number]]          B Medical/Surgery History Past Medical History:  Diagnosis Date   High cholesterol    Hypertension    History reviewed. No pertinent surgical history.   A IV Location/Drains/Wounds Patient Lines/Drains/Airways Status     Active Line/Drains/Airways     Name Placement date  Placement time Site Days   Peripheral IV 10/13/22 20 G Anterior;Left Hand 10/13/22  2141  Hand  1            Intake/Output Last 24 hours No intake or output data in the 24 hours ending 10/14/22 0517  Labs/Imaging Results for orders placed or performed during the hospital encounter of 10/13/22 (from the past 48 hour(s))  CBC with Differential     Status: None   Collection Time: 10/13/22  9:46 PM  Result Value Ref Range   WBC 10.2 4.0 - 10.5 K/uL   RBC 4.70 4.22 - 5.81 MIL/uL   Hemoglobin 15.1 13.0 - 17.0 g/dL   HCT 47.8 29.5 - 62.1 %   MCV 92.6 80.0 - 100.0 fL   MCH 32.1 26.0 - 34.0 pg   MCHC 34.7 30.0 - 36.0 g/dL   RDW 30.8 65.7 - 84.6 %   Platelets 252 150 - 400 K/uL   nRBC 0.0 0.0 - 0.2 %   Neutrophils Relative % 72 %   Neutro Abs 7.3 1.7 - 7.7 K/uL   Lymphocytes Relative 21 %   Lymphs Abs 2.1 0.7 - 4.0 K/uL   Monocytes Relative 6 %   Monocytes Absolute 0.7 0.1 - 1.0 K/uL   Eosinophils Relative 1 %   Eosinophils Absolute 0.1 0.0 - 0.5 K/uL   Basophils Relative 0 %   Basophils Absolute 0.0 0.0 - 0.1 K/uL   Immature Granulocytes 0 %   Abs Immature Granulocytes 0.03 0.00 - 0.07 K/uL  Comment: Performed at [facility], [address] [place], [place] [address]  Comprehensive metabolic panel     Status: Abnormal   Collection Time: 10/13/22  9:46 PM  Result Value Ref Range   Sodium 132 (L) 135 - 145 mmol/L   Potassium 4.1 3.5 - 5.1 mmol/L   Chloride 100 98 - 111 mmol/L   CO2 24 22 - 32 mmol/L   Glucose, Bld 158 (H) 70 - 99 mg/dL    Comment: Glucose reference range applies only to samples taken after fasting for at least 8 hours.   BUN 18 8 - 23 mg/dL   Creatinine, Ser 9.62 0.61 - 1.24 mg/dL   Calcium 8.7 (L) 8.9 - 10.3 mg/dL   Total Protein 7.3 6.5 - 8.1 g/dL   Albumin 4.0 3.5 - 5.0 g/dL   AST 30 15 - 41 U/L   ALT 33 0 - 44 U/L   Alkaline Phosphatase 35 (L) 38 - 126 U/L   Total Bilirubin 0.9 0.3 - 1.2 mg/dL   GFR, Estimated >95 >28 mL/min     Comment: (NOTE) Calculated using the CKD-EPI Creatinine Equation (2021)    Anion gap 8 5 - 15    Comment: Performed at [facility], [address] [place], [place] [address]   MR LUMBAR SPINE WO CONTRAST  Result Date: 10/14/2022 CLINICAL DATA:  Back pain, trouble urinating, incontinence, difficulty walking EXAM: MRI CERVICAL, THORACIC AND LUMBAR SPINE WITHOUT CONTRAST TECHNIQUE: Multiplanar and multiecho pulse sequences of the cervical spine, to include the craniocervical junction and cervicothoracic junction, and thoracic and lumbar spine, were obtained without intravenous contrast. COMPARISON:  None Available. FINDINGS: MRI CERVICAL SPINE FINDINGS Alignment: 2 mm anterolisthesis of C2 on C3 and C3 on C4. 3 mm anterolisthesis of C4 on C5. 3 mm retrolisthesis of C5 on C6. Vertebrae: No acute fracture, evidence of discitis, or suspicious osseous lesion. Osseous fusion across the C5-C6 disc space. Significant degenerative changes at the [facility] dental interval with pannus formation posterior to the dens. Cord: Normal signal and morphology. Posterior Fossa, vertebral arteries, paraspinal tissues: Partial empty sella. Normal craniocervical junction. Normal vertebral artery flow voids. Disc levels: C1-C2: Seen only on the sagittal images. Significant pannus formation posterior to the dens. Ligamentum flavum hypertrophy. Mild-to-moderate spinal canal stenosis. C2-C3: Anterolisthesis with disc unroofing and mild disc bulge. Facet and uncovertebral hypertrophy. Mild spinal canal stenosis. Moderate to severe bilateral neural foraminal narrowing. C3-C4: Trace anterolisthesis with disc unroofing and moderate disc bulge. Facet and uncovertebral hypertrophy. Moderate to severe spinal canal stenosis. Severe bilateral neural foraminal narrowing. C4-C5: Anterolisthesis with disc unroofing. Facet and uncovertebral hypertrophy. Severe spinal canal stenosis. Moderate right and severe left neural  foraminal narrowing. C5-C6: Retrolisthesis with fusion across the disc space. Facet and uncovertebral hypertrophy. No spinal canal stenosis. Mild right neural foraminal narrowing. C6-C7: Mild disc bulge. Facet and uncovertebral hypertrophy. Mild spinal canal stenosis. Mild bilateral neural foraminal narrowing. C7-T1: Mild disc bulge. Facet and uncovertebral hypertrophy. No spinal canal stenosis. Mild bilateral neural foraminal narrowing. MRI THORACIC SPINE FINDINGS Evaluation is somewhat limited by the atypical protocol used. Alignment: Trace anterolisthesis of T1 on T2 and T2 on T3. Trace retrolisthesis of T12 on L1. Preservation of the normal thoracic kyphosis. Vertebrae: No acute fracture or suspicious osseous lesion. Cord:  Normal signal and morphology. Paraspinal and other soft tissues: Limited due to respiratory motion. No definite pleural effusion or pulmonary opacity. Disc levels: T3-T4: Facet arthropathy. No significant spinal canal stenosis. Mild right neural  foraminal T4-T5: Facet arthropathy. No spinal canal stenosis. Mild right neural foraminal narrowing. T9-T10: Disc height loss. Facet arthropathy. No significant spinal canal stenosis. Mild left and moderate right neural foraminal narrowing. T10-T11: Disc height loss with moderate disc osteophyte complex. Facet arthropathy. Moderate spinal canal stenosis. Moderate to severe left and moderate right neural foraminal narrowing. T11-T12: Disc height loss with moderate disc osteophyte complex. Facet arthropathy. Moderate spinal canal stenosis. Mild bilateral neural foraminal narrowing. MRI LUMBAR SPINE FINDINGS Segmentation:  5 lumbar type vertebral bodies. Alignment: Trace retrolisthesis of T12 on L1, L1 on L2, and L5 on S1. Preservation of the normal lumbar lordosis. Vertebrae: No acute fracture, evidence of discitis, or suspicious osseous lesion. T1 and T2 hyperintense lesion in the L1 vertebral body, most likely a benign hemangioma. Conus medullaris and  cauda equina: Conus extends to the L1-L2 level. Conus and cauda equina appear normal. Paraspinal and other soft tissues: Left renal cyst, for which no follow-up currently indicated. No lymphadenopathy. Disc levels: T12-L1: Disc height loss and trace retrolisthesis with mild disc osteophyte complex. Mild facet arthropathy. No spinal canal stenosis. Mild right neural foraminal narrowing. L1-L2: Trace retrolisthesis with mild disc bulge and disc osteophyte complex. Mild-to-moderate facet arthropathy. Narrowing of the right-greater-than-left lateral recess. Mild spinal canal stenosis. Mild-to-moderate right neural foraminal narrowing. L2-L3: Mild disc bulge. Mild-to-moderate facet arthropathy. Narrowing of the lateral recesses. Moderate spinal canal stenosis. No significant neural foraminal narrowing. L3-L4: Mild disc bulge with right foraminal protrusion and central annular fissure. Moderate facet arthropathy. Ligamentum flavum hypertrophy. Narrowing of the lateral recesses. Moderate spinal canal stenosis. Mild left neural foraminal narrowing. L4-L5: Mild disc bulge. Moderate facet arthropathy. Ligamentum flavum hypertrophy. Narrowing of the lateral recesses. No spinal canal stenosis or neural foraminal narrowing. L5-S1: Trace retrolisthesis and disc height loss with mild disc bulge with superimposed right subarticular disc protrusion with annular fissure. Additional left paracentral annular fissure. This likely contacts the descending right S1 nerve roots. Mild facet arthropathy. No spinal canal stenosis. Moderate bilateral neural foraminal narrowing. IMPRESSION: CERVICAL SPINE: 1. C4-C5 severe spinal canal stenosis with moderate right and severe left neural foraminal narrowing. 2. C3-C4 moderate to severe spinal canal stenosis and severe bilateral neural foraminal narrowing. 3. C2-C3 mild spinal canal stenosis and moderate to severe bilateral neural foraminal narrowing. 4. C1-C2 mild-to-moderate spinal canal  stenosis, secondary to pannus formation posterior to the dens and ligamentum flavum hypertrophy. THORACIC SPINE: 1. T10-T11 moderate spinal canal stenosis with moderate to severe left and moderate right neural foraminal narrowing. 2. T11-T12 moderate spinal canal stenosis and mild bilateral neural foraminal narrowing. LUMBAR SPINE: 1. L2-L3 and L3-L4 moderate spinal canal stenosis and mild left neural foraminal narrowing. 2. L1-L2 mild spinal canal stenosis and mild-to-moderate right neural foraminal narrowing. 3. L5-S1 moderate bilateral neural foraminal narrowing. In addition a right subarticular disc protrusion and annular fissure likely contacts the descending right S1 nerve roots. 4. Narrowing of the lateral recesses throughout the lumbar spine, which can affect the descending nerve roots at each level. Electronically Signed   By: Wiliam Ke M.D.   On: 10/14/2022 01:52   MR THORACIC SPINE WO CONTRAST  Result Date: 10/14/2022 CLINICAL DATA:  Back pain, trouble urinating, incontinence, difficulty walking EXAM: MRI CERVICAL, THORACIC AND LUMBAR SPINE WITHOUT CONTRAST TECHNIQUE: Multiplanar and multiecho pulse sequences of the cervical spine, to include the craniocervical junction and cervicothoracic junction, and thoracic and lumbar spine, were obtained without intravenous contrast. COMPARISON:  None Available. FINDINGS: MRI CERVICAL SPINE FINDINGS Alignment: 2 mm anterolisthesis of  C2 on C3 and C3 on C4. 3 mm anterolisthesis of C4 on C5. 3 mm retrolisthesis of C5 on C6. Vertebrae: No acute fracture, evidence of discitis, or suspicious osseous lesion. Osseous fusion across the C5-C6 disc space. Significant degenerative changes at the [facility] dental interval with pannus formation posterior to the dens. Cord: Normal signal and morphology. Posterior Fossa, vertebral arteries, paraspinal tissues: Partial empty sella. Normal craniocervical junction. Normal vertebral artery flow voids. Disc levels: C1-C2: Seen  only on the sagittal images. Significant pannus formation posterior to the dens. Ligamentum flavum hypertrophy. Mild-to-moderate spinal canal stenosis. C2-C3: Anterolisthesis with disc unroofing and mild disc bulge. Facet and uncovertebral hypertrophy. Mild spinal canal stenosis. Moderate to severe bilateral neural foraminal narrowing. C3-C4: Trace anterolisthesis with disc unroofing and moderate disc bulge. Facet and uncovertebral hypertrophy. Moderate to severe spinal canal stenosis. Severe bilateral neural foraminal narrowing. C4-C5: Anterolisthesis with disc unroofing. Facet and uncovertebral hypertrophy. Severe spinal canal stenosis. Moderate right and severe left neural foraminal narrowing. C5-C6: Retrolisthesis with fusion across the disc space. Facet and uncovertebral hypertrophy. No spinal canal stenosis. Mild right neural foraminal narrowing. C6-C7: Mild disc bulge. Facet and uncovertebral hypertrophy. Mild spinal canal stenosis. Mild bilateral neural foraminal narrowing. C7-T1: Mild disc bulge. Facet and uncovertebral hypertrophy. No spinal canal stenosis. Mild bilateral neural foraminal narrowing. MRI THORACIC SPINE FINDINGS Evaluation is somewhat limited by the atypical protocol used. Alignment: Trace anterolisthesis of T1 on T2 and T2 on T3. Trace retrolisthesis of T12 on L1. Preservation of the normal thoracic kyphosis. Vertebrae: No acute fracture or suspicious osseous lesion. Cord:  Normal signal and morphology. Paraspinal and other soft tissues: Limited due to respiratory motion. No definite pleural effusion or pulmonary opacity. Disc levels: T3-T4: Facet arthropathy. No significant spinal canal stenosis. Mild right neural foraminal T4-T5: Facet arthropathy. No spinal canal stenosis. Mild right neural foraminal narrowing. T9-T10: Disc height loss. Facet arthropathy. No significant spinal canal stenosis. Mild left and moderate right neural foraminal narrowing. T10-T11: Disc height loss with moderate  disc osteophyte complex. Facet arthropathy. Moderate spinal canal stenosis. Moderate to severe left and moderate right neural foraminal narrowing. T11-T12: Disc height loss with moderate disc osteophyte complex. Facet arthropathy. Moderate spinal canal stenosis. Mild bilateral neural foraminal narrowing. MRI LUMBAR SPINE FINDINGS Segmentation:  5 lumbar type vertebral bodies. Alignment: Trace retrolisthesis of T12 on L1, L1 on L2, and L5 on S1. Preservation of the normal lumbar lordosis. Vertebrae: No acute fracture, evidence of discitis, or suspicious osseous lesion. T1 and T2 hyperintense lesion in the L1 vertebral body, most likely a benign hemangioma. Conus medullaris and cauda equina: Conus extends to the L1-L2 level. Conus and cauda equina appear normal. Paraspinal and other soft tissues: Left renal cyst, for which no follow-up currently indicated. No lymphadenopathy. Disc levels: T12-L1: Disc height loss and trace retrolisthesis with mild disc osteophyte complex. Mild facet arthropathy. No spinal canal stenosis. Mild right neural foraminal narrowing. L1-L2: Trace retrolisthesis with mild disc bulge and disc osteophyte complex. Mild-to-moderate facet arthropathy. Narrowing of the right-greater-than-left lateral recess. Mild spinal canal stenosis. Mild-to-moderate right neural foraminal narrowing. L2-L3: Mild disc bulge. Mild-to-moderate facet arthropathy. Narrowing of the lateral recesses. Moderate spinal canal stenosis. No significant neural foraminal narrowing. L3-L4: Mild disc bulge with right foraminal protrusion and central annular fissure. Moderate facet arthropathy. Ligamentum flavum hypertrophy. Narrowing of the lateral recesses. Moderate spinal canal stenosis. Mild left neural foraminal narrowing. L4-L5: Mild disc bulge. Moderate facet arthropathy. Ligamentum flavum hypertrophy. Narrowing of the lateral recesses. No spinal canal stenosis  or neural foraminal narrowing. L5-S1: Trace retrolisthesis and  disc height loss with mild disc bulge with superimposed right subarticular disc protrusion with annular fissure. Additional left paracentral annular fissure. This likely contacts the descending right S1 nerve roots. Mild facet arthropathy. No spinal canal stenosis. Moderate bilateral neural foraminal narrowing. IMPRESSION: CERVICAL SPINE: 1. C4-C5 severe spinal canal stenosis with moderate right and severe left neural foraminal narrowing. 2. C3-C4 moderate to severe spinal canal stenosis and severe bilateral neural foraminal narrowing. 3. C2-C3 mild spinal canal stenosis and moderate to severe bilateral neural foraminal narrowing. 4. C1-C2 mild-to-moderate spinal canal stenosis, secondary to pannus formation posterior to the dens and ligamentum flavum hypertrophy. THORACIC SPINE: 1. T10-T11 moderate spinal canal stenosis with moderate to severe left and moderate right neural foraminal narrowing. 2. T11-T12 moderate spinal canal stenosis and mild bilateral neural foraminal narrowing. LUMBAR SPINE: 1. L2-L3 and L3-L4 moderate spinal canal stenosis and mild left neural foraminal narrowing. 2. L1-L2 mild spinal canal stenosis and mild-to-moderate right neural foraminal narrowing. 3. L5-S1 moderate bilateral neural foraminal narrowing. In addition a right subarticular disc protrusion and annular fissure likely contacts the descending right S1 nerve roots. 4. Narrowing of the lateral recesses throughout the lumbar spine, which can affect the descending nerve roots at each level. Electronically Signed   By: Wiliam Ke M.D.   On: 10/14/2022 01:52   MR Cervical Spine Wo Contrast  Result Date: 10/14/2022 CLINICAL DATA:  Back pain, trouble urinating, incontinence, difficulty walking EXAM: MRI CERVICAL, THORACIC AND LUMBAR SPINE WITHOUT CONTRAST TECHNIQUE: Multiplanar and multiecho pulse sequences of the cervical spine, to include the craniocervical junction and cervicothoracic junction, and thoracic and lumbar spine,  were obtained without intravenous contrast. COMPARISON:  None Available. FINDINGS: MRI CERVICAL SPINE FINDINGS Alignment: 2 mm anterolisthesis of C2 on C3 and C3 on C4. 3 mm anterolisthesis of C4 on C5. 3 mm retrolisthesis of C5 on C6. Vertebrae: No acute fracture, evidence of discitis, or suspicious osseous lesion. Osseous fusion across the C5-C6 disc space. Significant degenerative changes at the [facility] dental interval with pannus formation posterior to the dens. Cord: Normal signal and morphology. Posterior Fossa, vertebral arteries, paraspinal tissues: Partial empty sella. Normal craniocervical junction. Normal vertebral artery flow voids. Disc levels: C1-C2: Seen only on the sagittal images. Significant pannus formation posterior to the dens. Ligamentum flavum hypertrophy. Mild-to-moderate spinal canal stenosis. C2-C3: Anterolisthesis with disc unroofing and mild disc bulge. Facet and uncovertebral hypertrophy. Mild spinal canal stenosis. Moderate to severe bilateral neural foraminal narrowing. C3-C4: Trace anterolisthesis with disc unroofing and moderate disc bulge. Facet and uncovertebral hypertrophy. Moderate to severe spinal canal stenosis. Severe bilateral neural foraminal narrowing. C4-C5: Anterolisthesis with disc unroofing. Facet and uncovertebral hypertrophy. Severe spinal canal stenosis. Moderate right and severe left neural foraminal narrowing. C5-C6: Retrolisthesis with fusion across the disc space. Facet and uncovertebral hypertrophy. No spinal canal stenosis. Mild right neural foraminal narrowing. C6-C7: Mild disc bulge. Facet and uncovertebral hypertrophy. Mild spinal canal stenosis. Mild bilateral neural foraminal narrowing. C7-T1: Mild disc bulge. Facet and uncovertebral hypertrophy. No spinal canal stenosis. Mild bilateral neural foraminal narrowing. MRI THORACIC SPINE FINDINGS Evaluation is somewhat limited by the atypical protocol used. Alignment: Trace anterolisthesis of T1 on T2 and T2 on  T3. Trace retrolisthesis of T12 on L1. Preservation of the normal thoracic kyphosis. Vertebrae: No acute fracture or suspicious osseous lesion. Cord:  Normal signal and morphology. Paraspinal and other soft tissues: Limited due to respiratory motion. No definite pleural effusion or pulmonary opacity. Disc levels:  T3-T4: Facet arthropathy. No significant spinal canal stenosis. Mild right neural foraminal T4-T5: Facet arthropathy. No spinal canal stenosis. Mild right neural foraminal narrowing. T9-T10: Disc height loss. Facet arthropathy. No significant spinal canal stenosis. Mild left and moderate right neural foraminal narrowing. T10-T11: Disc height loss with moderate disc osteophyte complex. Facet arthropathy. Moderate spinal canal stenosis. Moderate to severe left and moderate right neural foraminal narrowing. T11-T12: Disc height loss with moderate disc osteophyte complex. Facet arthropathy. Moderate spinal canal stenosis. Mild bilateral neural foraminal narrowing. MRI LUMBAR SPINE FINDINGS Segmentation:  5 lumbar type vertebral bodies. Alignment: Trace retrolisthesis of T12 on L1, L1 on L2, and L5 on S1. Preservation of the normal lumbar lordosis. Vertebrae: No acute fracture, evidence of discitis, or suspicious osseous lesion. T1 and T2 hyperintense lesion in the L1 vertebral body, most likely a benign hemangioma. Conus medullaris and cauda equina: Conus extends to the L1-L2 level. Conus and cauda equina appear normal. Paraspinal and other soft tissues: Left renal cyst, for which no follow-up currently indicated. No lymphadenopathy. Disc levels: T12-L1: Disc height loss and trace retrolisthesis with mild disc osteophyte complex. Mild facet arthropathy. No spinal canal stenosis. Mild right neural foraminal narrowing. L1-L2: Trace retrolisthesis with mild disc bulge and disc osteophyte complex. Mild-to-moderate facet arthropathy. Narrowing of the right-greater-than-left lateral recess. Mild spinal canal  stenosis. Mild-to-moderate right neural foraminal narrowing. L2-L3: Mild disc bulge. Mild-to-moderate facet arthropathy. Narrowing of the lateral recesses. Moderate spinal canal stenosis. No significant neural foraminal narrowing. L3-L4: Mild disc bulge with right foraminal protrusion and central annular fissure. Moderate facet arthropathy. Ligamentum flavum hypertrophy. Narrowing of the lateral recesses. Moderate spinal canal stenosis. Mild left neural foraminal narrowing. L4-L5: Mild disc bulge. Moderate facet arthropathy. Ligamentum flavum hypertrophy. Narrowing of the lateral recesses. No spinal canal stenosis or neural foraminal narrowing. L5-S1: Trace retrolisthesis and disc height loss with mild disc bulge with superimposed right subarticular disc protrusion with annular fissure. Additional left paracentral annular fissure. This likely contacts the descending right S1 nerve roots. Mild facet arthropathy. No spinal canal stenosis. Moderate bilateral neural foraminal narrowing. IMPRESSION: CERVICAL SPINE: 1. C4-C5 severe spinal canal stenosis with moderate right and severe left neural foraminal narrowing. 2. C3-C4 moderate to severe spinal canal stenosis and severe bilateral neural foraminal narrowing. 3. C2-C3 mild spinal canal stenosis and moderate to severe bilateral neural foraminal narrowing. 4. C1-C2 mild-to-moderate spinal canal stenosis, secondary to pannus formation posterior to the dens and ligamentum flavum hypertrophy. THORACIC SPINE: 1. T10-T11 moderate spinal canal stenosis with moderate to severe left and moderate right neural foraminal narrowing. 2. T11-T12 moderate spinal canal stenosis and mild bilateral neural foraminal narrowing. LUMBAR SPINE: 1. L2-L3 and L3-L4 moderate spinal canal stenosis and mild left neural foraminal narrowing. 2. L1-L2 mild spinal canal stenosis and mild-to-moderate right neural foraminal narrowing. 3. L5-S1 moderate bilateral neural foraminal narrowing. In addition a  right subarticular disc protrusion and annular fissure likely contacts the descending right S1 nerve roots. 4. Narrowing of the lateral recesses throughout the lumbar spine, which can affect the descending nerve roots at each level. Electronically Signed   By: Wiliam Ke M.D.   On: 10/14/2022 01:52    Pending Labs Unresulted Labs (From admission, onward)     Start     Ordered   10/14/22 0422  Magnesium  Once,   R        10/14/22 0422   10/14/22 0422  Phosphorus  Once,   R        10/14/22 0422  10/14/22 0422  TSH  Once,   R        10/14/22 0422   10/14/22 0422  Vitamin B12  Once,   R        10/14/22 0422            Vitals/Pain Today's Vitals   10/13/22 2213 10/13/22 2227 10/13/22 2339 10/14/22 0344  BP:   (!) 148/81 (!) 140/70  Pulse:   74 68  Resp:   17 16  Temp:  98.1 F (36.7 C) 98.8 F (37.1 C) 97.7 F (36.5 C)  TempSrc:  Oral Oral   SpO2:   92% 100%  Weight:      Height:      PainSc: 8  8       Isolation Precautions No active isolations  Medications Medications  dexamethasone (DECADRON) injection 4 mg (has no administration in time range)  enoxaparin (LOVENOX) injection 40 mg (has no administration in time range)  HYDROmorphone (DILAUDID) injection 0.5-1 mg (has no administration in time range)  acetaminophen (TYLENOL) tablet 650 mg (has no administration in time range)  atenolol (TENORMIN) tablet 50 mg (has no administration in time range)  zolpidem (AMBIEN) tablet 10 mg (has no administration in time range)  cholecalciferol (VITAMIN D3) 25 MCG (1000 UNIT) tablet 1,000 Units (has no administration in time range)  atorvastatin (LIPITOR) tablet 20 mg (has no administration in time range)  aspirin EC tablet 81 mg (has no administration in time range)  amLODipine (NORVASC) tablet 10 mg (has no administration in time range)  diclofenac Sodium (VOLTAREN) 1 % topical gel 2 g (has no administration in time range)  HYDROmorphone (DILAUDID) injection 1 mg (1 mg  Intravenous Given 10/13/22 2141)  diazepam (VALIUM) injection 5 mg (5 mg Intravenous Given 10/13/22 2341)  dexamethasone (DECADRON) injection 10 mg (10 mg Intravenous Given 10/14/22 0310)    Mobility walks     Focused Assessments Neuro Assessment Handoff:  Swallow screen pass?  Not stroke related         Neuro Assessment: Within Defined Limits Neuro Checks:      Has TPA been given? No If patient is a Neuro Trauma and patient is going to OR before floor call report to 4N Charge nurse: [phone] or [phone]   R Recommendations: See Admitting Provider Note  Report given to:   Additional Notes: Please call for any questions

## 2022-10-14 NOTE — Progress Notes (Signed)
[facility] PLAN OF CARE NOTE Patient: Mitchell Morgan WUJ:[id number]   PCP: Andreas Blower., MD DOB: [birth year]   DOA: 10/13/2022   DOS: 10/14/2022    Patient was admitted by my colleague earlier on 10/14/2022. I have reviewed the H&P as well as assessment and plan and agree with the same. Important changes in the plan are listed below.  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 Related aggressively. Neurosurgery recommending decompressive thoracic laminectomy tomorrow. N.p.o. after midnight. Does not appear to have any prohibitive risk for surgery.  Level of care: Med-Surg  Author: Lynden Oxford, MD [facility] 10/14/2022 5:57 PM   If 7PM-7AM, please contact night-coverage at [email or web address]

## 2022-10-14 NOTE — Consult Note (Signed)
Mitchell Morgan is an 77 y.o. male.   Chief Complaint: Weakness HPI: 77 year old male with progressive bilateral lower extremity weakness, sensory loss and some loss of urinary and bowel sensation without frank incontinence.  No history of recent trauma or injury.  No upper extremity symptoms.  Patient notes pain and tightness radiating into his lower abdominal region and groin bilaterally.  He is noted progressive gait instability with a number of falls over the past couple weeks.  He has had increased numbness in both lower extremities and has lost sensation in his perineal region.  Past Medical History:  Diagnosis Date   High cholesterol    Hypertension     History reviewed. No pertinent surgical history.  History reviewed. No pertinent family history. Social History:  reports that he has never smoked. He has never used smokeless tobacco. He reports that he does not drink alcohol and does not use drugs.  Allergies:  Allergies  Allergen Reactions   Pravastatin Other (See Comments)    myalgia   Shrimp [Shellfish Allergy] Swelling    Lobster, crab  Swollen eyes   Simvastatin Other (See Comments)    myalgia    Medications Prior to Admission  Medication Sig Dispense Refill   acetaminophen (TYLENOL) 500 MG tablet Take 500 mg by mouth in the morning.     amLODipine (NORVASC) 10 MG tablet Take 10 mg by mouth daily.     aspirin EC (ASPIRIN 81) 81 MG tablet Take 81 mg by mouth daily.     atenolol (TENORMIN) 50 MG tablet Take 50 mg by mouth daily.     atorvastatin (LIPITOR) 20 MG tablet Take 20 mg by mouth daily.     Cholecalciferol 50 MCG (2000 UT) TABS Take 1 tablet by mouth daily.     diclofenac Sodium (VOLTAREN) 1 % GEL Apply 2 g topically in the morning and at bedtime.     predniSONE (DELTASONE) 20 MG tablet Take 20 mg by mouth daily. For 30 days     PROLIA 60 MG/ML SOSY injection Inject 60 mg into the skin every 6 (six) months.     zolpidem (AMBIEN) 10 MG tablet Take 10 mg by  mouth at bedtime as needed for sleep.      Results for orders placed or performed during the hospital encounter of 10/13/22 (from the past 48 hour(s))  CBC with Differential     Status: None   Collection Time: 10/13/22  9:46 PM  Result Value Ref Range   WBC 10.2 4.0 - 10.5 K/uL   RBC 4.70 4.22 - 5.81 MIL/uL   Hemoglobin 15.1 13.0 - 17.0 g/dL   HCT 16.1 09.6 - 04.5 %   MCV 92.6 80.0 - 100.0 fL   MCH 32.1 26.0 - 34.0 pg   MCHC 34.7 30.0 - 36.0 g/dL   RDW 40.9 81.1 - 91.4 %   Platelets 252 150 - 400 K/uL   nRBC 0.0 0.0 - 0.2 %   Neutrophils Relative % 72 %   Neutro Abs 7.3 1.7 - 7.7 K/uL   Lymphocytes Relative 21 %   Lymphs Abs 2.1 0.7 - 4.0 K/uL   Monocytes Relative 6 %   Monocytes Absolute 0.7 0.1 - 1.0 K/uL   Eosinophils Relative 1 %   Eosinophils Absolute 0.1 0.0 - 0.5 K/uL   Basophils Relative 0 %   Basophils Absolute 0.0 0.0 - 0.1 K/uL   Immature Granulocytes 0 %   Abs Immature Granulocytes 0.03 0.00 - 0.07 K/uL  Comment: Performed at [facility], [address] [place], [place] [address]  Comprehensive metabolic panel     Status: Abnormal   Collection Time: 10/13/22  9:46 PM  Result Value Ref Range   Sodium 132 (L) 135 - 145 mmol/L   Potassium 4.1 3.5 - 5.1 mmol/L   Chloride 100 98 - 111 mmol/L   CO2 24 22 - 32 mmol/L   Glucose, Bld 158 (H) 70 - 99 mg/dL    Comment: Glucose reference range applies only to samples taken after fasting for at least 8 hours.   BUN 18 8 - 23 mg/dL   Creatinine, Ser 1.91 0.61 - 1.24 mg/dL   Calcium 8.7 (L) 8.9 - 10.3 mg/dL   Total Protein 7.3 6.5 - 8.1 g/dL   Albumin 4.0 3.5 - 5.0 g/dL   AST 30 15 - 41 U/L   ALT 33 0 - 44 U/L   Alkaline Phosphatase 35 (L) 38 - 126 U/L   Total Bilirubin 0.9 0.3 - 1.2 mg/dL   GFR, Estimated >47 >82 mL/min    Comment: (NOTE) Calculated using the CKD-EPI Creatinine Equation (2021)    Anion gap 8 5 - 15    Comment: Performed at [facility], [address],  [place], [place] [address]  Magnesium     Status: None   Collection Time: 10/14/22  5:29 AM  Result Value Ref Range   Magnesium 2.2 1.7 - 2.4 mg/dL    Comment: Performed at [facility], [address], [place], [place] [address]  Phosphorus     Status: None   Collection Time: 10/14/22  5:29 AM  Result Value Ref Range   Phosphorus 2.7 2.5 - 4.6 mg/dL    Comment: Performed at [facility], [address] [place], [place] [address]  Vitamin B12     Status: None   Collection Time: 10/14/22  5:29 AM  Result Value Ref Range   Vitamin B-12 195 180 - 914 pg/mL    Comment: (NOTE) This assay is not validated for testing neonatal or myeloproliferative syndrome specimens for Vitamin B12 levels. Performed at [facility], [address], [place], [place] [address]   TSH     Status: None   Collection Time: 10/14/22  5:29 AM  Result Value Ref Range   TSH 0.414 0.350 - 4.500 uIU/mL    Comment: Performed by a 3rd Generation assay with a functional sensitivity of <=0.01 uIU/mL. Performed at [facility], [address] [place], [place] [address]    MR LUMBAR SPINE WO CONTRAST  Result Date: 10/14/2022 CLINICAL DATA:  Back pain, trouble urinating, incontinence, difficulty walking EXAM: MRI CERVICAL, THORACIC AND LUMBAR SPINE WITHOUT CONTRAST TECHNIQUE: Multiplanar and multiecho pulse sequences of the cervical spine, to include the craniocervical junction and cervicothoracic junction, and thoracic and lumbar spine, were obtained without intravenous contrast. COMPARISON:  None Available. FINDINGS: MRI CERVICAL SPINE FINDINGS Alignment: 2 mm anterolisthesis of C2 on C3 and C3 on C4. 3 mm anterolisthesis of C4 on C5. 3 mm retrolisthesis of C5 on C6. Vertebrae: No acute fracture, evidence of discitis, or suspicious osseous lesion. Osseous fusion across the C5-C6 disc space. Significant degenerative changes at the [facility] dental interval with pannus formation posterior to the dens. Cord:  Normal signal and morphology. Posterior Fossa, vertebral arteries, paraspinal tissues: Partial empty sella. Normal craniocervical junction. Normal vertebral artery flow voids. Disc levels: C1-C2: Seen only on the sagittal images. Significant  pannus formation posterior to the dens. Ligamentum flavum hypertrophy. Mild-to-moderate spinal canal stenosis. C2-C3: Anterolisthesis with disc unroofing and mild disc bulge. Facet and uncovertebral hypertrophy. Mild spinal canal stenosis. Moderate to severe bilateral neural foraminal narrowing. C3-C4: Trace anterolisthesis with disc unroofing and moderate disc bulge. Facet and uncovertebral hypertrophy. Moderate to severe spinal canal stenosis. Severe bilateral neural foraminal narrowing. C4-C5: Anterolisthesis with disc unroofing. Facet and uncovertebral hypertrophy. Severe spinal canal stenosis. Moderate right and severe left neural foraminal narrowing. C5-C6: Retrolisthesis with fusion across the disc space. Facet and uncovertebral hypertrophy. No spinal canal stenosis. Mild right neural foraminal narrowing. C6-C7: Mild disc bulge. Facet and uncovertebral hypertrophy. Mild spinal canal stenosis. Mild bilateral neural foraminal narrowing. C7-T1: Mild disc bulge. Facet and uncovertebral hypertrophy. No spinal canal stenosis. Mild bilateral neural foraminal narrowing. MRI THORACIC SPINE FINDINGS Evaluation is somewhat limited by the atypical protocol used. Alignment: Trace anterolisthesis of T1 on T2 and T2 on T3. Trace retrolisthesis of T12 on L1. Preservation of the normal thoracic kyphosis. Vertebrae: No acute fracture or suspicious osseous lesion. Cord:  Normal signal and morphology. Paraspinal and other soft tissues: Limited due to respiratory motion. No definite pleural effusion or pulmonary opacity. Disc levels: T3-T4: Facet arthropathy. No significant spinal canal stenosis. Mild right neural foraminal T4-T5: Facet arthropathy. No spinal canal stenosis. Mild right neural  foraminal narrowing. T9-T10: Disc height loss. Facet arthropathy. No significant spinal canal stenosis. Mild left and moderate right neural foraminal narrowing. T10-T11: Disc height loss with moderate disc osteophyte complex. Facet arthropathy. Moderate spinal canal stenosis. Moderate to severe left and moderate right neural foraminal narrowing. T11-T12: Disc height loss with moderate disc osteophyte complex. Facet arthropathy. Moderate spinal canal stenosis. Mild bilateral neural foraminal narrowing. MRI LUMBAR SPINE FINDINGS Segmentation:  5 lumbar type vertebral bodies. Alignment: Trace retrolisthesis of T12 on L1, L1 on L2, and L5 on S1. Preservation of the normal lumbar lordosis. Vertebrae: No acute fracture, evidence of discitis, or suspicious osseous lesion. T1 and T2 hyperintense lesion in the L1 vertebral body, most likely a benign hemangioma. Conus medullaris and cauda equina: Conus extends to the L1-L2 level. Conus and cauda equina appear normal. Paraspinal and other soft tissues: Left renal cyst, for which no follow-up currently indicated. No lymphadenopathy. Disc levels: T12-L1: Disc height loss and trace retrolisthesis with mild disc osteophyte complex. Mild facet arthropathy. No spinal canal stenosis. Mild right neural foraminal narrowing. L1-L2: Trace retrolisthesis with mild disc bulge and disc osteophyte complex. Mild-to-moderate facet arthropathy. Narrowing of the right-greater-than-left lateral recess. Mild spinal canal stenosis. Mild-to-moderate right neural foraminal narrowing. L2-L3: Mild disc bulge. Mild-to-moderate facet arthropathy. Narrowing of the lateral recesses. Moderate spinal canal stenosis. No significant neural foraminal narrowing. L3-L4: Mild disc bulge with right foraminal protrusion and central annular fissure. Moderate facet arthropathy. Ligamentum flavum hypertrophy. Narrowing of the lateral recesses. Moderate spinal canal stenosis. Mild left neural foraminal narrowing. L4-L5:  Mild disc bulge. Moderate facet arthropathy. Ligamentum flavum hypertrophy. Narrowing of the lateral recesses. No spinal canal stenosis or neural foraminal narrowing. L5-S1: Trace retrolisthesis and disc height loss with mild disc bulge with superimposed right subarticular disc protrusion with annular fissure. Additional left paracentral annular fissure. This likely contacts the descending right S1 nerve roots. Mild facet arthropathy. No spinal canal stenosis. Moderate bilateral neural foraminal narrowing. IMPRESSION: CERVICAL SPINE: 1. C4-C5 severe spinal canal stenosis with moderate right and severe left neural foraminal narrowing. 2. C3-C4 moderate to severe spinal canal stenosis and severe bilateral neural foraminal narrowing. 3. C2-C3 mild spinal  canal stenosis and moderate to severe bilateral neural foraminal narrowing. 4. C1-C2 mild-to-moderate spinal canal stenosis, secondary to pannus formation posterior to the dens and ligamentum flavum hypertrophy. THORACIC SPINE: 1. T10-T11 moderate spinal canal stenosis with moderate to severe left and moderate right neural foraminal narrowing. 2. T11-T12 moderate spinal canal stenosis and mild bilateral neural foraminal narrowing. LUMBAR SPINE: 1. L2-L3 and L3-L4 moderate spinal canal stenosis and mild left neural foraminal narrowing. 2. L1-L2 mild spinal canal stenosis and mild-to-moderate right neural foraminal narrowing. 3. L5-S1 moderate bilateral neural foraminal narrowing. In addition a right subarticular disc protrusion and annular fissure likely contacts the descending right S1 nerve roots. 4. Narrowing of the lateral recesses throughout the lumbar spine, which can affect the descending nerve roots at each level. Electronically Signed   By: Wiliam Ke M.D.   On: 10/14/2022 01:52   MR THORACIC SPINE WO CONTRAST  Result Date: 10/14/2022 CLINICAL DATA:  Back pain, trouble urinating, incontinence, difficulty walking EXAM: MRI CERVICAL, THORACIC AND LUMBAR  SPINE WITHOUT CONTRAST TECHNIQUE: Multiplanar and multiecho pulse sequences of the cervical spine, to include the craniocervical junction and cervicothoracic junction, and thoracic and lumbar spine, were obtained without intravenous contrast. COMPARISON:  None Available. FINDINGS: MRI CERVICAL SPINE FINDINGS Alignment: 2 mm anterolisthesis of C2 on C3 and C3 on C4. 3 mm anterolisthesis of C4 on C5. 3 mm retrolisthesis of C5 on C6. Vertebrae: No acute fracture, evidence of discitis, or suspicious osseous lesion. Osseous fusion across the C5-C6 disc space. Significant degenerative changes at the [facility] dental interval with pannus formation posterior to the dens. Cord: Normal signal and morphology. Posterior Fossa, vertebral arteries, paraspinal tissues: Partial empty sella. Normal craniocervical junction. Normal vertebral artery flow voids. Disc levels: C1-C2: Seen only on the sagittal images. Significant pannus formation posterior to the dens. Ligamentum flavum hypertrophy. Mild-to-moderate spinal canal stenosis. C2-C3: Anterolisthesis with disc unroofing and mild disc bulge. Facet and uncovertebral hypertrophy. Mild spinal canal stenosis. Moderate to severe bilateral neural foraminal narrowing. C3-C4: Trace anterolisthesis with disc unroofing and moderate disc bulge. Facet and uncovertebral hypertrophy. Moderate to severe spinal canal stenosis. Severe bilateral neural foraminal narrowing. C4-C5: Anterolisthesis with disc unroofing. Facet and uncovertebral hypertrophy. Severe spinal canal stenosis. Moderate right and severe left neural foraminal narrowing. C5-C6: Retrolisthesis with fusion across the disc space. Facet and uncovertebral hypertrophy. No spinal canal stenosis. Mild right neural foraminal narrowing. C6-C7: Mild disc bulge. Facet and uncovertebral hypertrophy. Mild spinal canal stenosis. Mild bilateral neural foraminal narrowing. C7-T1: Mild disc bulge. Facet and uncovertebral hypertrophy. No spinal canal  stenosis. Mild bilateral neural foraminal narrowing. MRI THORACIC SPINE FINDINGS Evaluation is somewhat limited by the atypical protocol used. Alignment: Trace anterolisthesis of T1 on T2 and T2 on T3. Trace retrolisthesis of T12 on L1. Preservation of the normal thoracic kyphosis. Vertebrae: No acute fracture or suspicious osseous lesion. Cord:  Normal signal and morphology. Paraspinal and other soft tissues: Limited due to respiratory motion. No definite pleural effusion or pulmonary opacity. Disc levels: T3-T4: Facet arthropathy. No significant spinal canal stenosis. Mild right neural foraminal T4-T5: Facet arthropathy. No spinal canal stenosis. Mild right neural foraminal narrowing. T9-T10: Disc height loss. Facet arthropathy. No significant spinal canal stenosis. Mild left and moderate right neural foraminal narrowing. T10-T11: Disc height loss with moderate disc osteophyte complex. Facet arthropathy. Moderate spinal canal stenosis. Moderate to severe left and moderate right neural foraminal narrowing. T11-T12: Disc height loss with moderate disc osteophyte complex. Facet arthropathy. Moderate spinal canal stenosis. Mild bilateral neural foraminal  narrowing. MRI LUMBAR SPINE FINDINGS Segmentation:  5 lumbar type vertebral bodies. Alignment: Trace retrolisthesis of T12 on L1, L1 on L2, and L5 on S1. Preservation of the normal lumbar lordosis. Vertebrae: No acute fracture, evidence of discitis, or suspicious osseous lesion. T1 and T2 hyperintense lesion in the L1 vertebral body, most likely a benign hemangioma. Conus medullaris and cauda equina: Conus extends to the L1-L2 level. Conus and cauda equina appear normal. Paraspinal and other soft tissues: Left renal cyst, for which no follow-up currently indicated. No lymphadenopathy. Disc levels: T12-L1: Disc height loss and trace retrolisthesis with mild disc osteophyte complex. Mild facet arthropathy. No spinal canal stenosis. Mild right neural foraminal narrowing.  L1-L2: Trace retrolisthesis with mild disc bulge and disc osteophyte complex. Mild-to-moderate facet arthropathy. Narrowing of the right-greater-than-left lateral recess. Mild spinal canal stenosis. Mild-to-moderate right neural foraminal narrowing. L2-L3: Mild disc bulge. Mild-to-moderate facet arthropathy. Narrowing of the lateral recesses. Moderate spinal canal stenosis. No significant neural foraminal narrowing. L3-L4: Mild disc bulge with right foraminal protrusion and central annular fissure. Moderate facet arthropathy. Ligamentum flavum hypertrophy. Narrowing of the lateral recesses. Moderate spinal canal stenosis. Mild left neural foraminal narrowing. L4-L5: Mild disc bulge. Moderate facet arthropathy. Ligamentum flavum hypertrophy. Narrowing of the lateral recesses. No spinal canal stenosis or neural foraminal narrowing. L5-S1: Trace retrolisthesis and disc height loss with mild disc bulge with superimposed right subarticular disc protrusion with annular fissure. Additional left paracentral annular fissure. This likely contacts the descending right S1 nerve roots. Mild facet arthropathy. No spinal canal stenosis. Moderate bilateral neural foraminal narrowing. IMPRESSION: CERVICAL SPINE: 1. C4-C5 severe spinal canal stenosis with moderate right and severe left neural foraminal narrowing. 2. C3-C4 moderate to severe spinal canal stenosis and severe bilateral neural foraminal narrowing. 3. C2-C3 mild spinal canal stenosis and moderate to severe bilateral neural foraminal narrowing. 4. C1-C2 mild-to-moderate spinal canal stenosis, secondary to pannus formation posterior to the dens and ligamentum flavum hypertrophy. THORACIC SPINE: 1. T10-T11 moderate spinal canal stenosis with moderate to severe left and moderate right neural foraminal narrowing. 2. T11-T12 moderate spinal canal stenosis and mild bilateral neural foraminal narrowing. LUMBAR SPINE: 1. L2-L3 and L3-L4 moderate spinal canal stenosis and mild left  neural foraminal narrowing. 2. L1-L2 mild spinal canal stenosis and mild-to-moderate right neural foraminal narrowing. 3. L5-S1 moderate bilateral neural foraminal narrowing. In addition a right subarticular disc protrusion and annular fissure likely contacts the descending right S1 nerve roots. 4. Narrowing of the lateral recesses throughout the lumbar spine, which can affect the descending nerve roots at each level. Electronically Signed   By: Wiliam Ke M.D.   On: 10/14/2022 01:52   MR Cervical Spine Wo Contrast  Result Date: 10/14/2022 CLINICAL DATA:  Back pain, trouble urinating, incontinence, difficulty walking EXAM: MRI CERVICAL, THORACIC AND LUMBAR SPINE WITHOUT CONTRAST TECHNIQUE: Multiplanar and multiecho pulse sequences of the cervical spine, to include the craniocervical junction and cervicothoracic junction, and thoracic and lumbar spine, were obtained without intravenous contrast. COMPARISON:  None Available. FINDINGS: MRI CERVICAL SPINE FINDINGS Alignment: 2 mm anterolisthesis of C2 on C3 and C3 on C4. 3 mm anterolisthesis of C4 on C5. 3 mm retrolisthesis of C5 on C6. Vertebrae: No acute fracture, evidence of discitis, or suspicious osseous lesion. Osseous fusion across the C5-C6 disc space. Significant degenerative changes at the [facility] dental interval with pannus formation posterior to the dens. Cord: Normal signal and morphology. Posterior Fossa, vertebral arteries, paraspinal tissues: Partial empty sella. Normal craniocervical junction. Normal vertebral artery flow voids.  Disc levels: C1-C2: Seen only on the sagittal images. Significant pannus formation posterior to the dens. Ligamentum flavum hypertrophy. Mild-to-moderate spinal canal stenosis. C2-C3: Anterolisthesis with disc unroofing and mild disc bulge. Facet and uncovertebral hypertrophy. Mild spinal canal stenosis. Moderate to severe bilateral neural foraminal narrowing. C3-C4: Trace anterolisthesis with disc unroofing and moderate  disc bulge. Facet and uncovertebral hypertrophy. Moderate to severe spinal canal stenosis. Severe bilateral neural foraminal narrowing. C4-C5: Anterolisthesis with disc unroofing. Facet and uncovertebral hypertrophy. Severe spinal canal stenosis. Moderate right and severe left neural foraminal narrowing. C5-C6: Retrolisthesis with fusion across the disc space. Facet and uncovertebral hypertrophy. No spinal canal stenosis. Mild right neural foraminal narrowing. C6-C7: Mild disc bulge. Facet and uncovertebral hypertrophy. Mild spinal canal stenosis. Mild bilateral neural foraminal narrowing. C7-T1: Mild disc bulge. Facet and uncovertebral hypertrophy. No spinal canal stenosis. Mild bilateral neural foraminal narrowing. MRI THORACIC SPINE FINDINGS Evaluation is somewhat limited by the atypical protocol used. Alignment: Trace anterolisthesis of T1 on T2 and T2 on T3. Trace retrolisthesis of T12 on L1. Preservation of the normal thoracic kyphosis. Vertebrae: No acute fracture or suspicious osseous lesion. Cord:  Normal signal and morphology. Paraspinal and other soft tissues: Limited due to respiratory motion. No definite pleural effusion or pulmonary opacity. Disc levels: T3-T4: Facet arthropathy. No significant spinal canal stenosis. Mild right neural foraminal T4-T5: Facet arthropathy. No spinal canal stenosis. Mild right neural foraminal narrowing. T9-T10: Disc height loss. Facet arthropathy. No significant spinal canal stenosis. Mild left and moderate right neural foraminal narrowing. T10-T11: Disc height loss with moderate disc osteophyte complex. Facet arthropathy. Moderate spinal canal stenosis. Moderate to severe left and moderate right neural foraminal narrowing. T11-T12: Disc height loss with moderate disc osteophyte complex. Facet arthropathy. Moderate spinal canal stenosis. Mild bilateral neural foraminal narrowing. MRI LUMBAR SPINE FINDINGS Segmentation:  5 lumbar type vertebral bodies. Alignment: Trace  retrolisthesis of T12 on L1, L1 on L2, and L5 on S1. Preservation of the normal lumbar lordosis. Vertebrae: No acute fracture, evidence of discitis, or suspicious osseous lesion. T1 and T2 hyperintense lesion in the L1 vertebral body, most likely a benign hemangioma. Conus medullaris and cauda equina: Conus extends to the L1-L2 level. Conus and cauda equina appear normal. Paraspinal and other soft tissues: Left renal cyst, for which no follow-up currently indicated. No lymphadenopathy. Disc levels: T12-L1: Disc height loss and trace retrolisthesis with mild disc osteophyte complex. Mild facet arthropathy. No spinal canal stenosis. Mild right neural foraminal narrowing. L1-L2: Trace retrolisthesis with mild disc bulge and disc osteophyte complex. Mild-to-moderate facet arthropathy. Narrowing of the right-greater-than-left lateral recess. Mild spinal canal stenosis. Mild-to-moderate right neural foraminal narrowing. L2-L3: Mild disc bulge. Mild-to-moderate facet arthropathy. Narrowing of the lateral recesses. Moderate spinal canal stenosis. No significant neural foraminal narrowing. L3-L4: Mild disc bulge with right foraminal protrusion and central annular fissure. Moderate facet arthropathy. Ligamentum flavum hypertrophy. Narrowing of the lateral recesses. Moderate spinal canal stenosis. Mild left neural foraminal narrowing. L4-L5: Mild disc bulge. Moderate facet arthropathy. Ligamentum flavum hypertrophy. Narrowing of the lateral recesses. No spinal canal stenosis or neural foraminal narrowing. L5-S1: Trace retrolisthesis and disc height loss with mild disc bulge with superimposed right subarticular disc protrusion with annular fissure. Additional left paracentral annular fissure. This likely contacts the descending right S1 nerve roots. Mild facet arthropathy. No spinal canal stenosis. Moderate bilateral neural foraminal narrowing. IMPRESSION: CERVICAL SPINE: 1. C4-C5 severe spinal canal stenosis with moderate  right and severe left neural foraminal narrowing. 2. C3-C4 moderate to severe spinal canal  stenosis and severe bilateral neural foraminal narrowing. 3. C2-C3 mild spinal canal stenosis and moderate to severe bilateral neural foraminal narrowing. 4. C1-C2 mild-to-moderate spinal canal stenosis, secondary to pannus formation posterior to the dens and ligamentum flavum hypertrophy. THORACIC SPINE: 1. T10-T11 moderate spinal canal stenosis with moderate to severe left and moderate right neural foraminal narrowing. 2. T11-T12 moderate spinal canal stenosis and mild bilateral neural foraminal narrowing. LUMBAR SPINE: 1. L2-L3 and L3-L4 moderate spinal canal stenosis and mild left neural foraminal narrowing. 2. L1-L2 mild spinal canal stenosis and mild-to-moderate right neural foraminal narrowing. 3. L5-S1 moderate bilateral neural foraminal narrowing. In addition a right subarticular disc protrusion and annular fissure likely contacts the descending right S1 nerve roots. 4. Narrowing of the lateral recesses throughout the lumbar spine, which can affect the descending nerve roots at each level. Electronically Signed   By: Wiliam Ke M.D.   On: 10/14/2022 01:52    Pertinent items noted in HPI and remainder of comprehensive ROS otherwise negative.  Blood pressure (!) 164/90, pulse 70, temperature 97.9 F (36.6 C), temperature source Oral, resp. rate 16, height 5\' 10"  (1.778 m), weight 97.1 kg, SpO2 96 %.  The patient is awake and alert.  He is oriented and appropriate.  Speech is fluent.  Judgment insight are intact.  Cranial nerve function normal bilaterally.  Motor examination reveals 5/5 strength in both upper extremities with normal tone.  Upper extremity sensory function normal throughout.  Lower extremity muscle strength 4+/5 with some increased tone.  Patient with relative sensory level around T11.  Patient with normal upper extremity deep tendon reflexes.  Left patellar reflex normal.  Right patellar reflex  increased.  Patient with clonus in his right foot and some trace clonus on the left.  Examination head ears eyes nose and throat is unremarked.  Chest and abdomen are benign.  Extremities are free from injury or deformity.  I reviewed the patient's MRI scan of the cervical spine.  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  The patient has autofusion at C5-6.  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  Remainder of his cervical spine with the aforementioned multilevel degenerative change but no other areas of high-grade stenosis or spinal cord compression.  I reviewed the patient's MRI scan of his thoracic spine.  This demonstrates multilevel thoracic degenerative disease.  At T10-T11 the patient has evidence of moderately severe spinal stenosis with some spinal cord signal abnormality.  At T11-T12 he has some mild stenosis.  The remainder of his thoracic spine is free from significant spinal stenosis or cord compression.  I also reviewed the patient's MRI scan of his lumbar spine.  This once again reveals multilevel degenerative disease worse at L1-2.  There are no areas of high-grade spinal stenosis. 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  I discussed situation with the patient.  I recommended that we move forward with T10-T11 decompressive laminectomy in hopes of improving his symptoms.  I have discussed the risks and benefits involved with surgery including but not limited to risk of anesthesia, bleeding, infection, CSF leak, nerve root injury, spinal cord injury, later instability, continued pain, not benefit.  The patient has been given the option as questions.  He appears to understand.  I will examine the surgery schedule and see  when we can get this on.  I anticipate surgery Thursday or Friday.  Patient may eat normally today and be mobilized as tolerated.  Recommend starting IV steroids to help with cord compression  Temple Pacini 10/14/2022, 7:24 AM

## 2022-10-14 NOTE — Assessment & Plan Note (Signed)
Bilateral leg weakness and numbness, also of pelvis.  Acting like a cauda equina syndrome, but without obvious focal lesion to the central cord on MRI to explain his presentation.  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  No obvious culprit central cord lesion, impingement, etc in L spine. More sensory deficits and more slowly progressive than I would expect out of GBS. Not quite sure exactly what is going on here. EDP consulted neuro surgery: As noted, no specific cord lesion / compression that should be causing his symptoms on MRI today Start decadron 10mg  x1 then 4mg  Q6H Full NS consult to follow in AM I have consulted neurology who will be by to evaluate patient as well. Will order PRN dilaudid for pain control Will let patient eat as it doesn't sound like immediate surgery planned. Checking some labs: TSH B12 Magnesium Phos

## 2022-10-14 NOTE — Assessment & Plan Note (Signed)
Looks like this is just diet controlled. CBG checks AC/HS

## 2022-10-14 NOTE — H&P (Signed)
History and Physical    Patient: Mitchell Morgan GNF:[id number] DOB: [birth year] DOA: 10/13/2022 DOS: the patient was seen and examined on 10/14/2022 PCP: Andreas Blower., MD  Patient coming from: Home  Chief Complaint:  Chief Complaint  Patient presents with   Back Pain   Numbness   HPI: Mitchell Morgan is a 77 y.o. male with medical history significant of HTN, HLD.  Pt had sudden onset about 4 weeks ago of mid back pain, BLE numbness, weakness.  Symptoms have been slowly progressive since onset.  Patient also noticed more falls and increasing unsteady gait. Patient states that for the last week or so, he cannot feel when he urinates or have bowel movements.  Having urinary incontinence at times, so now he just makes himself go to the bathroom on a schedule.  He states that he saw [facility] last week for his knee and was told that he may have some back problems. He was told that he needs a MRI of the cervical and thoracic spine. Patient states that today he noticed worsening trouble walking. He states that he is unable to walk without assistance. He also has worsening numbness in his groin area as well as both legs.  He has no symptoms of numbness nor weakness above his waist (no UE nor trunk symptoms).   Review of Systems: As mentioned in the history of present illness. All other systems reviewed and are negative. Past Medical History:  Diagnosis Date   High cholesterol    Hypertension    History reviewed. No pertinent surgical history. Social History:  reports that he has never smoked. He has never used smokeless tobacco. He reports that he does not drink alcohol and does not use drugs.  Allergies  Allergen Reactions   Pravastatin Other (See Comments)    myalgia   Shrimp [Shellfish Allergy] Swelling    Lobster, crab  Swollen eyes   Simvastatin Other (See Comments)    myalgia    History reviewed. No pertinent family history.  Prior to Admission medications    Medication Sig Start Date End Date Taking? Authorizing Provider  acetaminophen (TYLENOL) 500 MG tablet Take 500 mg by mouth in the morning.   Yes [provider]  amLODipine (NORVASC) 10 MG tablet Take 10 mg by mouth daily.   Yes [provider]  aspirin EC (ASPIRIN 81) 81 MG tablet Take 81 mg by mouth daily.   Yes [provider]  atenolol (TENORMIN) 50 MG tablet Take 50 mg by mouth daily. 06/07/15  Yes [provider]  atorvastatin (LIPITOR) 20 MG tablet Take 20 mg by mouth daily. 06/07/15  Yes [provider]  Cholecalciferol 50 MCG (2000 UT) TABS Take 1 tablet by mouth daily. 03/31/17  Yes [provider]  diclofenac Sodium (VOLTAREN) 1 % GEL Apply 2 g topically in the morning and at bedtime. 04/23/22  Yes [provider]  predniSONE (DELTASONE) 20 MG tablet Take 20 mg by mouth daily. For 30 days 10/11/22  Yes [provider]  PROLIA 60 MG/ML SOSY injection Inject 60 mg into the skin every 6 (six) months. 04/06/22  Yes [provider]  zolpidem (AMBIEN) 10 MG tablet Take 10 mg by mouth at bedtime as needed for sleep. 06/07/15  Yes [provider]    Physical Exam: Vitals:   10/13/22 2032 10/13/22 2227 10/13/22 2339 10/14/22 0344  BP:   (!) 148/81 (!) 140/70  Pulse:   74 68  Resp:   17  16  Temp:  98.1 F (36.7 C) 98.8 F (37.1 C) 97.7 F (36.5 C)  TempSrc:  Oral Oral   SpO2:   92% 100%  Weight: 97.1 kg     Height: 5\' 10"  (1.778 m)      Constitutional: NAD, calm, comfortable Respiratory: clear to auscultation bilaterally, no wheezing, no crackles. Normal respiratory effort. No accessory muscle use.  Cardiovascular: Regular rate and rhythm, no murmurs / rubs / gallops. No extremity edema. 2+ pedal pulses. No carotid bruits.  Abdomen: no tenderness, no masses palpated. No hepatosplenomegaly. Bowel sounds positive.  Neurologic: Saddle anesthesia present, also numbness to legs.  Patellar reflexes  present.  3/5 strength to BLE, 5/5 strength and normal sensation to BUE Psychiatric: Normal judgment and insight. Alert and oriented x 3. Normal mood.   Data Reviewed:    Labs on Admission: I have personally reviewed following labs and imaging studies  CBC: Recent Labs  Lab 10/13/22 2146  WBC 10.2  NEUTROABS 7.3  HGB 15.1  HCT 43.5  MCV 92.6  PLT 252   Basic Metabolic Panel: Recent Labs  Lab 10/13/22 2146  NA 132*  K 4.1  CL 100  CO2 24  GLUCOSE 158*  BUN 18  CREATININE 1.23  CALCIUM 8.7*   GFR: Estimated Creatinine Clearance: 58.8 mL/min (by C-G formula based on SCr of 1.23 mg/dL). Liver Function Tests: Recent Labs  Lab 10/13/22 2146  AST 30  ALT 33  ALKPHOS 35*  BILITOT 0.9  PROT 7.3  ALBUMIN 4.0    Urine analysis:    Component Value Date/Time   COLORURINE YELLOW 08/13/2017 2326   APPEARANCEUR CLEAR 08/13/2017 2326   LABSPEC 1.025 08/13/2017 2326   PHURINE 5.5 08/13/2017 2326   GLUCOSEU NEGATIVE 08/13/2017 2326   HGBUR TRACE (A) 08/13/2017 2326   BILIRUBINUR NEGATIVE 08/13/2017 2326   KETONESUR NEGATIVE 08/13/2017 2326   PROTEINUR NEGATIVE 08/13/2017 2326   UROBILINOGEN 1.0 10/17/2008 2003   NITRITE NEGATIVE 08/13/2017 2326   LEUKOCYTESUR NEGATIVE 08/13/2017 2326    Radiological Exams on Admission: MR LUMBAR SPINE WO CONTRAST  Result Date: 10/14/2022 CLINICAL DATA:  Back pain, trouble urinating, incontinence, difficulty walking EXAM: MRI CERVICAL, THORACIC AND LUMBAR SPINE WITHOUT CONTRAST TECHNIQUE: Multiplanar and multiecho pulse sequences of the cervical spine, to include the craniocervical junction and cervicothoracic junction, and thoracic and lumbar spine, were obtained without intravenous contrast. COMPARISON:  None Available. FINDINGS: MRI CERVICAL SPINE FINDINGS Alignment: 2 mm anterolisthesis of C2 on C3 and C3 on C4. 3 mm anterolisthesis of C4 on C5. 3 mm retrolisthesis of C5 on C6. Vertebrae: No acute fracture, evidence of discitis, or  suspicious osseous lesion. Osseous fusion across the C5-C6 disc space. Significant degenerative changes at the [facility] dental interval with pannus formation posterior to the dens. Cord: Normal signal and morphology. Posterior Fossa, vertebral arteries, paraspinal tissues: Partial empty sella. Normal craniocervical junction. Normal vertebral artery flow voids. Disc levels: C1-C2: Seen only on the sagittal images. Significant pannus formation posterior to the dens. Ligamentum flavum hypertrophy. Mild-to-moderate spinal canal stenosis. C2-C3: Anterolisthesis with disc unroofing and mild disc bulge. Facet and uncovertebral hypertrophy. Mild spinal canal stenosis. Moderate to severe bilateral neural foraminal narrowing. C3-C4: Trace anterolisthesis with disc unroofing and moderate disc bulge. Facet and uncovertebral hypertrophy. Moderate to severe spinal canal stenosis. Severe bilateral neural foraminal narrowing. C4-C5: Anterolisthesis with disc unroofing. Facet and uncovertebral hypertrophy. Severe spinal canal stenosis. Moderate right and severe left neural foraminal narrowing. C5-C6: Retrolisthesis with fusion across the disc space.  Facet and uncovertebral hypertrophy. No spinal canal stenosis. Mild right neural foraminal narrowing. C6-C7: Mild disc bulge. Facet and uncovertebral hypertrophy. Mild spinal canal stenosis. Mild bilateral neural foraminal narrowing. C7-T1: Mild disc bulge. Facet and uncovertebral hypertrophy. No spinal canal stenosis. Mild bilateral neural foraminal narrowing. MRI THORACIC SPINE FINDINGS Evaluation is somewhat limited by the atypical protocol used. Alignment: Trace anterolisthesis of T1 on T2 and T2 on T3. Trace retrolisthesis of T12 on L1. Preservation of the normal thoracic kyphosis. Vertebrae: No acute fracture or suspicious osseous lesion. Cord:  Normal signal and morphology. Paraspinal and other soft tissues: Limited due to respiratory motion. No definite pleural effusion or  pulmonary opacity. Disc levels: T3-T4: Facet arthropathy. No significant spinal canal stenosis. Mild right neural foraminal T4-T5: Facet arthropathy. No spinal canal stenosis. Mild right neural foraminal narrowing. T9-T10: Disc height loss. Facet arthropathy. No significant spinal canal stenosis. Mild left and moderate right neural foraminal narrowing. T10-T11: Disc height loss with moderate disc osteophyte complex. Facet arthropathy. Moderate spinal canal stenosis. Moderate to severe left and moderate right neural foraminal narrowing. T11-T12: Disc height loss with moderate disc osteophyte complex. Facet arthropathy. Moderate spinal canal stenosis. Mild bilateral neural foraminal narrowing. MRI LUMBAR SPINE FINDINGS Segmentation:  5 lumbar type vertebral bodies. Alignment: Trace retrolisthesis of T12 on L1, L1 on L2, and L5 on S1. Preservation of the normal lumbar lordosis. Vertebrae: No acute fracture, evidence of discitis, or suspicious osseous lesion. T1 and T2 hyperintense lesion in the L1 vertebral body, most likely a benign hemangioma. Conus medullaris and cauda equina: Conus extends to the L1-L2 level. Conus and cauda equina appear normal. Paraspinal and other soft tissues: Left renal cyst, for which no follow-up currently indicated. No lymphadenopathy. Disc levels: T12-L1: Disc height loss and trace retrolisthesis with mild disc osteophyte complex. Mild facet arthropathy. No spinal canal stenosis. Mild right neural foraminal narrowing. L1-L2: Trace retrolisthesis with mild disc bulge and disc osteophyte complex. Mild-to-moderate facet arthropathy. Narrowing of the right-greater-than-left lateral recess. Mild spinal canal stenosis. Mild-to-moderate right neural foraminal narrowing. L2-L3: Mild disc bulge. Mild-to-moderate facet arthropathy. Narrowing of the lateral recesses. Moderate spinal canal stenosis. No significant neural foraminal narrowing. L3-L4: Mild disc bulge with right foraminal protrusion and  central annular fissure. Moderate facet arthropathy. Ligamentum flavum hypertrophy. Narrowing of the lateral recesses. Moderate spinal canal stenosis. Mild left neural foraminal narrowing. L4-L5: Mild disc bulge. Moderate facet arthropathy. Ligamentum flavum hypertrophy. Narrowing of the lateral recesses. No spinal canal stenosis or neural foraminal narrowing. L5-S1: Trace retrolisthesis and disc height loss with mild disc bulge with superimposed right subarticular disc protrusion with annular fissure. Additional left paracentral annular fissure. This likely contacts the descending right S1 nerve roots. Mild facet arthropathy. No spinal canal stenosis. Moderate bilateral neural foraminal narrowing. IMPRESSION: CERVICAL SPINE: 1. C4-C5 severe spinal canal stenosis with moderate right and severe left neural foraminal narrowing. 2. C3-C4 moderate to severe spinal canal stenosis and severe bilateral neural foraminal narrowing. 3. C2-C3 mild spinal canal stenosis and moderate to severe bilateral neural foraminal narrowing. 4. C1-C2 mild-to-moderate spinal canal stenosis, secondary to pannus formation posterior to the dens and ligamentum flavum hypertrophy. THORACIC SPINE: 1. T10-T11 moderate spinal canal stenosis with moderate to severe left and moderate right neural foraminal narrowing. 2. T11-T12 moderate spinal canal stenosis and mild bilateral neural foraminal narrowing. LUMBAR SPINE: 1. L2-L3 and L3-L4 moderate spinal canal stenosis and mild left neural foraminal narrowing. 2. L1-L2 mild spinal canal stenosis and mild-to-moderate right neural foraminal narrowing. 3. L5-S1 moderate bilateral  neural foraminal narrowing. In addition a right subarticular disc protrusion and annular fissure likely contacts the descending right S1 nerve roots. 4. Narrowing of the lateral recesses throughout the lumbar spine, which can affect the descending nerve roots at each level. Electronically Signed   By: Wiliam Ke M.D.   On:  10/14/2022 01:52   MR THORACIC SPINE WO CONTRAST  Result Date: 10/14/2022 CLINICAL DATA:  Back pain, trouble urinating, incontinence, difficulty walking EXAM: MRI CERVICAL, THORACIC AND LUMBAR SPINE WITHOUT CONTRAST TECHNIQUE: Multiplanar and multiecho pulse sequences of the cervical spine, to include the craniocervical junction and cervicothoracic junction, and thoracic and lumbar spine, were obtained without intravenous contrast. COMPARISON:  None Available. FINDINGS: MRI CERVICAL SPINE FINDINGS Alignment: 2 mm anterolisthesis of C2 on C3 and C3 on C4. 3 mm anterolisthesis of C4 on C5. 3 mm retrolisthesis of C5 on C6. Vertebrae: No acute fracture, evidence of discitis, or suspicious osseous lesion. Osseous fusion across the C5-C6 disc space. Significant degenerative changes at the [facility] dental interval with pannus formation posterior to the dens. Cord: Normal signal and morphology. Posterior Fossa, vertebral arteries, paraspinal tissues: Partial empty sella. Normal craniocervical junction. Normal vertebral artery flow voids. Disc levels: C1-C2: Seen only on the sagittal images. Significant pannus formation posterior to the dens. Ligamentum flavum hypertrophy. Mild-to-moderate spinal canal stenosis. C2-C3: Anterolisthesis with disc unroofing and mild disc bulge. Facet and uncovertebral hypertrophy. Mild spinal canal stenosis. Moderate to severe bilateral neural foraminal narrowing. C3-C4: Trace anterolisthesis with disc unroofing and moderate disc bulge. Facet and uncovertebral hypertrophy. Moderate to severe spinal canal stenosis. Severe bilateral neural foraminal narrowing. C4-C5: Anterolisthesis with disc unroofing. Facet and uncovertebral hypertrophy. Severe spinal canal stenosis. Moderate right and severe left neural foraminal narrowing. C5-C6: Retrolisthesis with fusion across the disc space. Facet and uncovertebral hypertrophy. No spinal canal stenosis. Mild right neural foraminal narrowing. C6-C7: Mild  disc bulge. Facet and uncovertebral hypertrophy. Mild spinal canal stenosis. Mild bilateral neural foraminal narrowing. C7-T1: Mild disc bulge. Facet and uncovertebral hypertrophy. No spinal canal stenosis. Mild bilateral neural foraminal narrowing. MRI THORACIC SPINE FINDINGS Evaluation is somewhat limited by the atypical protocol used. Alignment: Trace anterolisthesis of T1 on T2 and T2 on T3. Trace retrolisthesis of T12 on L1. Preservation of the normal thoracic kyphosis. Vertebrae: No acute fracture or suspicious osseous lesion. Cord:  Normal signal and morphology. Paraspinal and other soft tissues: Limited due to respiratory motion. No definite pleural effusion or pulmonary opacity. Disc levels: T3-T4: Facet arthropathy. No significant spinal canal stenosis. Mild right neural foraminal T4-T5: Facet arthropathy. No spinal canal stenosis. Mild right neural foraminal narrowing. T9-T10: Disc height loss. Facet arthropathy. No significant spinal canal stenosis. Mild left and moderate right neural foraminal narrowing. T10-T11: Disc height loss with moderate disc osteophyte complex. Facet arthropathy. Moderate spinal canal stenosis. Moderate to severe left and moderate right neural foraminal narrowing. T11-T12: Disc height loss with moderate disc osteophyte complex. Facet arthropathy. Moderate spinal canal stenosis. Mild bilateral neural foraminal narrowing. MRI LUMBAR SPINE FINDINGS Segmentation:  5 lumbar type vertebral bodies. Alignment: Trace retrolisthesis of T12 on L1, L1 on L2, and L5 on S1. Preservation of the normal lumbar lordosis. Vertebrae: No acute fracture, evidence of discitis, or suspicious osseous lesion. T1 and T2 hyperintense lesion in the L1 vertebral body, most likely a benign hemangioma. Conus medullaris and cauda equina: Conus extends to the L1-L2 level. Conus and cauda equina appear normal. Paraspinal and other soft tissues: Left renal cyst, for which no follow-up currently indicated. No  lymphadenopathy. Disc levels: T12-L1: Disc height loss and trace retrolisthesis with mild disc osteophyte complex. Mild facet arthropathy. No spinal canal stenosis. Mild right neural foraminal narrowing. L1-L2: Trace retrolisthesis with mild disc bulge and disc osteophyte complex. Mild-to-moderate facet arthropathy. Narrowing of the right-greater-than-left lateral recess. Mild spinal canal stenosis. Mild-to-moderate right neural foraminal narrowing. L2-L3: Mild disc bulge. Mild-to-moderate facet arthropathy. Narrowing of the lateral recesses. Moderate spinal canal stenosis. No significant neural foraminal narrowing. L3-L4: Mild disc bulge with right foraminal protrusion and central annular fissure. Moderate facet arthropathy. Ligamentum flavum hypertrophy. Narrowing of the lateral recesses. Moderate spinal canal stenosis. Mild left neural foraminal narrowing. L4-L5: Mild disc bulge. Moderate facet arthropathy. Ligamentum flavum hypertrophy. Narrowing of the lateral recesses. No spinal canal stenosis or neural foraminal narrowing. L5-S1: Trace retrolisthesis and disc height loss with mild disc bulge with superimposed right subarticular disc protrusion with annular fissure. Additional left paracentral annular fissure. This likely contacts the descending right S1 nerve roots. Mild facet arthropathy. No spinal canal stenosis. Moderate bilateral neural foraminal narrowing. IMPRESSION: CERVICAL SPINE: 1. C4-C5 severe spinal canal stenosis with moderate right and severe left neural foraminal narrowing. 2. C3-C4 moderate to severe spinal canal stenosis and severe bilateral neural foraminal narrowing. 3. C2-C3 mild spinal canal stenosis and moderate to severe bilateral neural foraminal narrowing. 4. C1-C2 mild-to-moderate spinal canal stenosis, secondary to pannus formation posterior to the dens and ligamentum flavum hypertrophy. THORACIC SPINE: 1. T10-T11 moderate spinal canal stenosis with moderate to severe left and  moderate right neural foraminal narrowing. 2. T11-T12 moderate spinal canal stenosis and mild bilateral neural foraminal narrowing. LUMBAR SPINE: 1. L2-L3 and L3-L4 moderate spinal canal stenosis and mild left neural foraminal narrowing. 2. L1-L2 mild spinal canal stenosis and mild-to-moderate right neural foraminal narrowing. 3. L5-S1 moderate bilateral neural foraminal narrowing. In addition a right subarticular disc protrusion and annular fissure likely contacts the descending right S1 nerve roots. 4. Narrowing of the lateral recesses throughout the lumbar spine, which can affect the descending nerve roots at each level. Electronically Signed   By: Wiliam Ke M.D.   On: 10/14/2022 01:52   MR Cervical Spine Wo Contrast  Result Date: 10/14/2022 CLINICAL DATA:  Back pain, trouble urinating, incontinence, difficulty walking EXAM: MRI CERVICAL, THORACIC AND LUMBAR SPINE WITHOUT CONTRAST TECHNIQUE: Multiplanar and multiecho pulse sequences of the cervical spine, to include the craniocervical junction and cervicothoracic junction, and thoracic and lumbar spine, were obtained without intravenous contrast. COMPARISON:  None Available. FINDINGS: MRI CERVICAL SPINE FINDINGS Alignment: 2 mm anterolisthesis of C2 on C3 and C3 on C4. 3 mm anterolisthesis of C4 on C5. 3 mm retrolisthesis of C5 on C6. Vertebrae: No acute fracture, evidence of discitis, or suspicious osseous lesion. Osseous fusion across the C5-C6 disc space. Significant degenerative changes at the [facility] dental interval with pannus formation posterior to the dens. Cord: Normal signal and morphology. Posterior Fossa, vertebral arteries, paraspinal tissues: Partial empty sella. Normal craniocervical junction. Normal vertebral artery flow voids. Disc levels: C1-C2: Seen only on the sagittal images. Significant pannus formation posterior to the dens. Ligamentum flavum hypertrophy. Mild-to-moderate spinal canal stenosis. C2-C3: Anterolisthesis with disc  unroofing and mild disc bulge. Facet and uncovertebral hypertrophy. Mild spinal canal stenosis. Moderate to severe bilateral neural foraminal narrowing. C3-C4: Trace anterolisthesis with disc unroofing and moderate disc bulge. Facet and uncovertebral hypertrophy. Moderate to severe spinal canal stenosis. Severe bilateral neural foraminal narrowing. C4-C5: Anterolisthesis with disc unroofing. Facet and uncovertebral hypertrophy. Severe spinal canal stenosis. Moderate right and severe left  neural foraminal narrowing. C5-C6: Retrolisthesis with fusion across the disc space. Facet and uncovertebral hypertrophy. No spinal canal stenosis. Mild right neural foraminal narrowing. C6-C7: Mild disc bulge. Facet and uncovertebral hypertrophy. Mild spinal canal stenosis. Mild bilateral neural foraminal narrowing. C7-T1: Mild disc bulge. Facet and uncovertebral hypertrophy. No spinal canal stenosis. Mild bilateral neural foraminal narrowing. MRI THORACIC SPINE FINDINGS Evaluation is somewhat limited by the atypical protocol used. Alignment: Trace anterolisthesis of T1 on T2 and T2 on T3. Trace retrolisthesis of T12 on L1. Preservation of the normal thoracic kyphosis. Vertebrae: No acute fracture or suspicious osseous lesion. Cord:  Normal signal and morphology. Paraspinal and other soft tissues: Limited due to respiratory motion. No definite pleural effusion or pulmonary opacity. Disc levels: T3-T4: Facet arthropathy. No significant spinal canal stenosis. Mild right neural foraminal T4-T5: Facet arthropathy. No spinal canal stenosis. Mild right neural foraminal narrowing. T9-T10: Disc height loss. Facet arthropathy. No significant spinal canal stenosis. Mild left and moderate right neural foraminal narrowing. T10-T11: Disc height loss with moderate disc osteophyte complex. Facet arthropathy. Moderate spinal canal stenosis. Moderate to severe left and moderate right neural foraminal narrowing. T11-T12: Disc height loss with  moderate disc osteophyte complex. Facet arthropathy. Moderate spinal canal stenosis. Mild bilateral neural foraminal narrowing. MRI LUMBAR SPINE FINDINGS Segmentation:  5 lumbar type vertebral bodies. Alignment: Trace retrolisthesis of T12 on L1, L1 on L2, and L5 on S1. Preservation of the normal lumbar lordosis. Vertebrae: No acute fracture, evidence of discitis, or suspicious osseous lesion. T1 and T2 hyperintense lesion in the L1 vertebral body, most likely a benign hemangioma. Conus medullaris and cauda equina: Conus extends to the L1-L2 level. Conus and cauda equina appear normal. Paraspinal and other soft tissues: Left renal cyst, for which no follow-up currently indicated. No lymphadenopathy. Disc levels: T12-L1: Disc height loss and trace retrolisthesis with mild disc osteophyte complex. Mild facet arthropathy. No spinal canal stenosis. Mild right neural foraminal narrowing. L1-L2: Trace retrolisthesis with mild disc bulge and disc osteophyte complex. Mild-to-moderate facet arthropathy. Narrowing of the right-greater-than-left lateral recess. Mild spinal canal stenosis. Mild-to-moderate right neural foraminal narrowing. L2-L3: Mild disc bulge. Mild-to-moderate facet arthropathy. Narrowing of the lateral recesses. Moderate spinal canal stenosis. No significant neural foraminal narrowing. L3-L4: Mild disc bulge with right foraminal protrusion and central annular fissure. Moderate facet arthropathy. Ligamentum flavum hypertrophy. Narrowing of the lateral recesses. Moderate spinal canal stenosis. Mild left neural foraminal narrowing. L4-L5: Mild disc bulge. Moderate facet arthropathy. Ligamentum flavum hypertrophy. Narrowing of the lateral recesses. No spinal canal stenosis or neural foraminal narrowing. L5-S1: Trace retrolisthesis and disc height loss with mild disc bulge with superimposed right subarticular disc protrusion with annular fissure. Additional left paracentral annular fissure. This likely contacts  the descending right S1 nerve roots. Mild facet arthropathy. No spinal canal stenosis. Moderate bilateral neural foraminal narrowing. IMPRESSION: CERVICAL SPINE: 1. C4-C5 severe spinal canal stenosis with moderate right and severe left neural foraminal narrowing. 2. C3-C4 moderate to severe spinal canal stenosis and severe bilateral neural foraminal narrowing. 3. C2-C3 mild spinal canal stenosis and moderate to severe bilateral neural foraminal narrowing. 4. C1-C2 mild-to-moderate spinal canal stenosis, secondary to pannus formation posterior to the dens and ligamentum flavum hypertrophy. THORACIC SPINE: 1. T10-T11 moderate spinal canal stenosis with moderate to severe left and moderate right neural foraminal narrowing. 2. T11-T12 moderate spinal canal stenosis and mild bilateral neural foraminal narrowing. LUMBAR SPINE: 1. L2-L3 and L3-L4 moderate spinal canal stenosis and mild left neural foraminal narrowing. 2. L1-L2 mild spinal canal  stenosis and mild-to-moderate right neural foraminal narrowing. 3. L5-S1 moderate bilateral neural foraminal narrowing. In addition a right subarticular disc protrusion and annular fissure likely contacts the descending right S1 nerve roots. 4. Narrowing of the lateral recesses throughout the lumbar spine, which can affect the descending nerve roots at each level. Electronically Signed   By: Wiliam Ke M.D.   On: 10/14/2022 01:52    Post void residual = 94 ml   Assessment and Plan: * Bilateral leg weakness Bilateral leg weakness and numbness, also of pelvis.  Acting like a cauda equina syndrome, but without obvious focal lesion to the central cord on MRI to explain his presentation.  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  No obvious culprit central cord lesion, impingement, etc in L spine. More sensory deficits and more slowly progressive than I would expect out of GBS. Not quite sure exactly what is going on here. EDP  consulted neuro surgery: As noted, no specific cord lesion / compression that should be causing his symptoms on MRI today Start decadron 10mg  x1 then 4mg  Q6H Full NS consult to follow in AM I have consulted neurology who will be by to evaluate patient as well. Will order PRN dilaudid for pain control Will let patient eat as it doesn't sound like immediate surgery planned. Checking some labs: TSH B12 Magnesium Phos  HTN (hypertension) Cont home BP meds  Type 2 diabetes mellitus without complication, without long-term current use of insulin (HCC) Looks like this is just diet controlled. CBG checks AC/HS      Advance Care Planning:   Code Status: Full Code Confirmed with patient  Consults: EDP consulted Neuro surgery.  I spoke with Dr. Wilford Corner with neurology  Family Communication: No family in room  Severity of Illness: The appropriate patient status for this patient is OBSERVATION. Observation status is judged to be reasonable and necessary in order to provide the required intensity of service to ensure the patient's safety. The patient's presenting symptoms, physical exam findings, and initial radiographic and laboratory data in the context of their medical condition is felt to place them at decreased risk for further clinical deterioration. Furthermore, it is anticipated that the patient will be medically stable for discharge from the hospital within 2 midnights of admission.   Author: Hillary Bow., DO 10/14/2022 4:35 AM  For on call review [email or web address].

## 2022-10-15 ENCOUNTER — Inpatient Hospital Stay (HOSPITAL_COMMUNITY): Payer: No Typology Code available for payment source

## 2022-10-15 ENCOUNTER — Other Ambulatory Visit: Payer: Self-pay

## 2022-10-15 ENCOUNTER — Inpatient Hospital Stay (HOSPITAL_COMMUNITY)
Admission: EM | Disposition: A | Discharge status: Home / Self Care | Payer: Self-pay | Source: Home / Self Care | Attending: Internal Medicine

## 2022-10-15 ENCOUNTER — Encounter (HOSPITAL_COMMUNITY): Payer: Self-pay | Admitting: Internal Medicine

## 2022-10-15 ENCOUNTER — Inpatient Hospital Stay (HOSPITAL_COMMUNITY): Payer: No Typology Code available for payment source | Admitting: Certified Registered Nurse Anesthetist

## 2022-10-15 DIAGNOSIS — E669 Obesity, unspecified: Secondary | ICD-10-CM | POA: Diagnosis not present

## 2022-10-15 DIAGNOSIS — I10 Essential (primary) hypertension: Secondary | ICD-10-CM

## 2022-10-15 DIAGNOSIS — M4804 Spinal stenosis, thoracic region: Secondary | ICD-10-CM

## 2022-10-15 DIAGNOSIS — Z6831 Body mass index (BMI) 31.0-31.9, adult: Secondary | ICD-10-CM

## 2022-10-15 DIAGNOSIS — R29898 Other symptoms and signs involving the musculoskeletal system: Secondary | ICD-10-CM | POA: Diagnosis not present

## 2022-10-15 HISTORY — PX: DECOMPRESSIVE LUMBAR LAMINECTOMY LEVEL 1: SHX5791

## 2022-10-15 LAB — GLUCOSE, CAPILLARY
Glucose-Capillary: 123 mg/dL — ABNORMAL HIGH (ref 70–99)
Glucose-Capillary: 124 mg/dL — ABNORMAL HIGH (ref 70–99)
Glucose-Capillary: 156 mg/dL — ABNORMAL HIGH (ref 70–99)

## 2022-10-15 LAB — SURGICAL PCR SCREEN
MRSA, PCR: NEGATIVE
Staphylococcus aureus: NEGATIVE

## 2022-10-15 LAB — CBC WITH DIFFERENTIAL/PLATELET
Abs Immature Granulocytes: 0.1 10*3/uL — ABNORMAL HIGH (ref 0.00–0.07)
Basophils Absolute: 0 10*3/uL (ref 0.0–0.1)
Basophils Relative: 0 %
Eosinophils Absolute: 0 10*3/uL (ref 0.0–0.5)
Eosinophils Relative: 0 %
HCT: 40.5 % (ref 39.0–52.0)
Hemoglobin: 14.1 g/dL (ref 13.0–17.0)
Immature Granulocytes: 1 %
Lymphocytes Relative: 6 %
Lymphs Abs: 0.9 10*3/uL (ref 0.7–4.0)
MCH: 31.5 pg (ref 26.0–34.0)
MCHC: 34.8 g/dL (ref 30.0–36.0)
MCV: 90.4 fL (ref 80.0–100.0)
Monocytes Absolute: 0.6 10*3/uL (ref 0.1–1.0)
Monocytes Relative: 4 %
Neutro Abs: 13.3 10*3/uL — ABNORMAL HIGH (ref 1.7–7.7)
Neutrophils Relative %: 89 %
Platelets: 270 10*3/uL (ref 150–400)
RBC: 4.48 MIL/uL (ref 4.22–5.81)
RDW: 12.3 % (ref 11.5–15.5)
WBC: 14.9 10*3/uL — ABNORMAL HIGH (ref 4.0–10.5)
nRBC: 0 % (ref 0.0–0.2)

## 2022-10-15 LAB — BASIC METABOLIC PANEL
Anion gap: 15 (ref 5–15)
BUN: 24 mg/dL — ABNORMAL HIGH (ref 8–23)
CO2: 22 mmol/L (ref 22–32)
Calcium: 9.4 mg/dL (ref 8.9–10.3)
Chloride: 100 mmol/L (ref 98–111)
Creatinine, Ser: 0.83 mg/dL (ref 0.61–1.24)
GFR, Estimated: >60 mL/min (ref >60)
Glucose, Bld: 143 mg/dL — ABNORMAL HIGH (ref 70–99)
Potassium: 3.8 mmol/L (ref 3.5–5.1)
Sodium: 137 mmol/L (ref 135–145)

## 2022-10-15 SURGERY — DECOMPRESSIVE LUMBAR LAMINECTOMY LEVEL 1
Anesthesia: General | Site: Back

## 2022-10-15 MED ORDER — LIDOCAINE 2% (20 MG/ML) 5 ML SYRINGE
INTRAMUSCULAR | Status: AC
  Filled 2022-10-15: qty 5

## 2022-10-15 MED ORDER — ACETAMINOPHEN 650 MG RE SUPP: 650.0000 mg | RECTAL | Status: DC | PRN

## 2022-10-15 MED ORDER — PROPOFOL 10 MG/ML IV BOLUS
INTRAVENOUS | Status: DC | PRN
  Administered 2022-10-15: 170 mg via INTRAVENOUS

## 2022-10-15 MED ORDER — ROCURONIUM BROMIDE 10 MG/ML (PF) SYRINGE
PREFILLED_SYRINGE | INTRAVENOUS | Status: AC
  Filled 2022-10-15: qty 10

## 2022-10-15 MED ORDER — GLYCOPYRROLATE 0.2 MG/ML IJ SOLN
INTRAMUSCULAR | Status: DC | PRN
  Administered 2022-10-15: .2 mg via INTRAVENOUS

## 2022-10-15 MED ORDER — ACETAMINOPHEN 10 MG/ML IV SOLN
INTRAVENOUS | Status: DC | PRN
  Administered 2022-10-15: 1000 mg via INTRAVENOUS

## 2022-10-15 MED ORDER — ACETAMINOPHEN 10 MG/ML IV SOLN
INTRAVENOUS | Status: AC
  Filled 2022-10-15: qty 100

## 2022-10-15 MED ORDER — FENTANYL CITRATE (PF) 100 MCG/2ML IJ SOLN
INTRAMUSCULAR | Status: DC | PRN
  Administered 2022-10-15 (×2): 25 ug via INTRAVENOUS
  Administered 2022-10-15: 100 ug via INTRAVENOUS
  Administered 2022-10-15 (×2): 50 ug via INTRAVENOUS

## 2022-10-15 MED ORDER — HYDROCODONE-ACETAMINOPHEN 5-325 MG PO TABS
1.0000 | ORAL_TABLET | ORAL | Status: DC | PRN
  Administered 2022-10-18 – 2022-10-21 (×3): 1 via ORAL
  Filled 2022-10-15 (×4): qty 1

## 2022-10-15 MED ORDER — SENNOSIDES-DOCUSATE SODIUM 8.6-50 MG PO TABS
2.0000 | ORAL_TABLET | Freq: Two times a day (BID) | ORAL | Status: DC
  Administered 2022-10-15 – 2022-10-21 (×12): 2 via ORAL
  Filled 2022-10-15 (×15): qty 2

## 2022-10-15 MED ORDER — SUGAMMADEX SODIUM 200 MG/2ML IV SOLN
INTRAVENOUS | Status: DC | PRN
  Administered 2022-10-15: 300 mg via INTRAVENOUS

## 2022-10-15 MED ORDER — ORAL CARE MOUTH RINSE: 15.0000 mL | Freq: Once | OROMUCOSAL | Status: AC

## 2022-10-15 MED ORDER — HYDRALAZINE HCL 20 MG/ML IJ SOLN
5.0000 mg | Freq: Four times a day (QID) | INTRAMUSCULAR | Status: DC | PRN
  Administered 2022-10-15: 5 mg via INTRAVENOUS

## 2022-10-15 MED ORDER — 0.9 % SODIUM CHLORIDE (POUR BTL) OPTIME
TOPICAL | Status: DC | PRN
  Administered 2022-10-15: 1000 mL

## 2022-10-15 MED ORDER — CEFAZOLIN SODIUM-DEXTROSE 1-4 GM/50ML-% IV SOLN
1.0000 g | Freq: Three times a day (TID) | INTRAVENOUS | Status: AC
  Administered 2022-10-16 (×2): 1 g via INTRAVENOUS
  Filled 2022-10-15 (×3): qty 50

## 2022-10-15 MED ORDER — CEFAZOLIN SODIUM-DEXTROSE 2-4 GM/100ML-% IV SOLN
INTRAVENOUS | Status: AC
  Filled 2022-10-15: qty 100

## 2022-10-15 MED ORDER — AMISULPRIDE (ANTIEMETIC) 5 MG/2ML IV SOLN: 10.0000 mg | Freq: Once | INTRAVENOUS | Status: DC | PRN

## 2022-10-15 MED ORDER — SODIUM CHLORIDE 0.9% FLUSH
3.0000 mL | Freq: Two times a day (BID) | INTRAVENOUS | Status: DC
  Administered 2022-10-15 – 2022-10-21 (×12): 3 mL via INTRAVENOUS

## 2022-10-15 MED ORDER — LACTATED RINGERS IV SOLN: INTRAVENOUS | Status: DC

## 2022-10-15 MED ORDER — SODIUM CHLORIDE 0.9 % IV SOLN
250.0000 mL | INTRAVENOUS | Status: DC
  Administered 2022-10-15: 250 mL via INTRAVENOUS

## 2022-10-15 MED ORDER — EPHEDRINE 5 MG/ML INJ
INTRAVENOUS | Status: AC
  Filled 2022-10-15: qty 5

## 2022-10-15 MED ORDER — PHENOL 1.4 % MT LIQD: 1.0000 | OROMUCOSAL | Status: DC | PRN

## 2022-10-15 MED ORDER — THROMBIN 5000 UNITS EX SOLR
CUTANEOUS | Status: AC
  Filled 2022-10-15: qty 5000

## 2022-10-15 MED ORDER — ACETAMINOPHEN 325 MG PO TABS
650.0000 mg | ORAL_TABLET | ORAL | Status: DC | PRN
  Administered 2022-10-18 – 2022-10-20 (×2): 650 mg via ORAL
  Filled 2022-10-15 (×2): qty 2

## 2022-10-15 MED ORDER — ONDANSETRON HCL 4 MG/2ML IJ SOLN
INTRAMUSCULAR | Status: DC | PRN
  Administered 2022-10-15: 4 mg via INTRAVENOUS

## 2022-10-15 MED ORDER — PROPOFOL 10 MG/ML IV BOLUS
INTRAVENOUS | Status: AC
  Filled 2022-10-15: qty 20

## 2022-10-15 MED ORDER — HYDROCODONE-ACETAMINOPHEN 10-325 MG PO TABS: 2.0000 | ORAL_TABLET | ORAL | Status: DC | PRN

## 2022-10-15 MED ORDER — BUPIVACAINE HCL (PF) 0.25 % IJ SOLN
INTRAMUSCULAR | Status: DC | PRN
  Administered 2022-10-15: 20 mL

## 2022-10-15 MED ORDER — ONDANSETRON HCL 4 MG PO TABS: 4.0000 mg | ORAL_TABLET | Freq: Four times a day (QID) | ORAL | Status: DC | PRN

## 2022-10-15 MED ORDER — ACETAMINOPHEN 10 MG/ML IV SOLN: 1000.0000 mg | Freq: Once | INTRAVENOUS | Status: DC | PRN

## 2022-10-15 MED ORDER — CHLORHEXIDINE GLUCONATE 0.12 % MT SOLN
OROMUCOSAL | Status: AC
  Administered 2022-10-15: 15 mL via OROMUCOSAL
  Filled 2022-10-15: qty 15

## 2022-10-15 MED ORDER — CHLORHEXIDINE GLUCONATE 0.12 % MT SOLN: 15.0000 mL | Freq: Once | OROMUCOSAL | Status: AC

## 2022-10-15 MED ORDER — CYCLOBENZAPRINE HCL 5 MG PO TABS
10.0000 mg | ORAL_TABLET | Freq: Three times a day (TID) | ORAL | Status: DC | PRN
  Administered 2022-10-18: 10 mg via ORAL
  Filled 2022-10-15: qty 2

## 2022-10-15 MED ORDER — ONDANSETRON HCL 4 MG/2ML IJ SOLN: 4.0000 mg | Freq: Four times a day (QID) | INTRAMUSCULAR | Status: DC | PRN

## 2022-10-15 MED ORDER — PHENYLEPHRINE 80 MCG/ML (10ML) SYRINGE FOR IV PUSH (FOR BLOOD PRESSURE SUPPORT)
PREFILLED_SYRINGE | INTRAVENOUS | Status: AC
  Filled 2022-10-15: qty 10

## 2022-10-15 MED ORDER — ROCURONIUM BROMIDE 100 MG/10ML IV SOLN
INTRAVENOUS | Status: DC | PRN
  Administered 2022-10-15: 60 mg via INTRAVENOUS
  Administered 2022-10-15: 20 mg via INTRAVENOUS

## 2022-10-15 MED ORDER — LACTULOSE 10 GM/15ML PO SOLN
30.0000 g | Freq: Once | ORAL | Status: AC
  Administered 2022-10-15: 30 g via ORAL
  Filled 2022-10-15: qty 45

## 2022-10-15 MED ORDER — DEXAMETHASONE SODIUM PHOSPHATE 10 MG/ML IJ SOLN
INTRAMUSCULAR | Status: AC
  Filled 2022-10-15: qty 1

## 2022-10-15 MED ORDER — CEFAZOLIN SODIUM-DEXTROSE 2-4 GM/100ML-% IV SOLN
2.0000 g | INTRAVENOUS | Status: AC
  Administered 2022-10-15: 2 g via INTRAVENOUS

## 2022-10-15 MED ORDER — DEXAMETHASONE SODIUM PHOSPHATE 10 MG/ML IJ SOLN
INTRAMUSCULAR | Status: DC | PRN
  Administered 2022-10-15: 10 mg via INTRAVENOUS

## 2022-10-15 MED ORDER — HYDRALAZINE HCL 20 MG/ML IJ SOLN
INTRAMUSCULAR | Status: AC
  Filled 2022-10-15: qty 1

## 2022-10-15 MED ORDER — KETOROLAC TROMETHAMINE 15 MG/ML IJ SOLN
7.5000 mg | Freq: Four times a day (QID) | INTRAMUSCULAR | Status: AC
  Administered 2022-10-15 – 2022-10-16 (×4): 7.5 mg via INTRAVENOUS
  Filled 2022-10-15 (×4): qty 1

## 2022-10-15 MED ORDER — MENTHOL 3 MG MT LOZG: 1.0000 | LOZENGE | OROMUCOSAL | Status: DC | PRN

## 2022-10-15 MED ORDER — THROMBIN 5000 UNITS EX SOLR
CUTANEOUS | Status: AC
  Filled 2022-10-15: qty 10000

## 2022-10-15 MED ORDER — LIDOCAINE HCL (CARDIAC) PF 100 MG/5ML IV SOSY
PREFILLED_SYRINGE | INTRAVENOUS | Status: DC | PRN
  Administered 2022-10-15: 60 mg via INTRAVENOUS

## 2022-10-15 MED ORDER — FENTANYL CITRATE (PF) 100 MCG/2ML IJ SOLN
INTRAMUSCULAR | Status: AC
  Filled 2022-10-15: qty 2

## 2022-10-15 MED ORDER — SODIUM CHLORIDE 0.9% FLUSH: 3.0000 mL | INTRAVENOUS | Status: DC | PRN

## 2022-10-15 MED ORDER — THROMBIN 5000 UNITS EX SOLR
OROMUCOSAL | Status: DC | PRN
  Administered 2022-10-15: 5 mL via TOPICAL

## 2022-10-15 MED ORDER — FENTANYL CITRATE (PF) 250 MCG/5ML IJ SOLN
INTRAMUSCULAR | Status: AC
  Filled 2022-10-15: qty 5

## 2022-10-15 MED ORDER — BUPIVACAINE HCL (PF) 0.25 % IJ SOLN
INTRAMUSCULAR | Status: AC
  Filled 2022-10-15: qty 30

## 2022-10-15 MED ORDER — THROMBIN (RECOMBINANT) 5000 UNITS EX SOLR
CUTANEOUS | Status: DC | PRN
  Administered 2022-10-15: 1 via TOPICAL

## 2022-10-15 MED ORDER — ONDANSETRON HCL 4 MG/2ML IJ SOLN: 4.0000 mg | Freq: Once | INTRAMUSCULAR | Status: DC | PRN

## 2022-10-15 MED ORDER — ONDANSETRON HCL 4 MG/2ML IJ SOLN
INTRAMUSCULAR | Status: AC
  Filled 2022-10-15: qty 2

## 2022-10-15 MED ORDER — FENTANYL CITRATE (PF) 100 MCG/2ML IJ SOLN
25.0000 ug | INTRAMUSCULAR | Status: DC | PRN
  Administered 2022-10-15 (×2): 50 ug via INTRAVENOUS

## 2022-10-15 SURGICAL SUPPLY — 51 items
ADH SKN CLS APL DERMABOND .7 (GAUZE/BANDAGES/DRESSINGS) ×1
ADH SKN CLS LQ APL DERMABOND (GAUZE/BANDAGES/DRESSINGS) ×1
APL SKNCLS STERI-STRIP NONHPOA (GAUZE/BANDAGES/DRESSINGS) ×1
BAG COUNTER SPONGE SURGICOUNT (BAG) ×1 IMPLANT
BAG DECANTER FOR FLEXI CONT (MISCELLANEOUS) ×1 IMPLANT
BAG SPNG CNTER NS LX DISP (BAG) ×1
BENZOIN TINCTURE PRP APPL 2/3 (GAUZE/BANDAGES/DRESSINGS) ×1 IMPLANT
BLADE CLIPPER SURG (BLADE) IMPLANT
BUR CUTTER 7.0 ROUND (BURR) ×1 IMPLANT
CANISTER SUCT 3000ML PPV (MISCELLANEOUS) ×1 IMPLANT
DERMABOND ADVANCED .7 DNX12 (GAUZE/BANDAGES/DRESSINGS) ×1 IMPLANT
DERMABOND ADVANCED .7 DNX6 (GAUZE/BANDAGES/DRESSINGS) IMPLANT
DRAPE HALF SHEET 40X57 (DRAPES) IMPLANT
DRAPE LAPAROTOMY 100X72X124 (DRAPES) ×1 IMPLANT
DRAPE MICROSCOPE SLANT 54X150 (MISCELLANEOUS) ×1 IMPLANT
DRAPE SURG 17X23 STRL (DRAPES) ×2 IMPLANT
DRSG OPSITE POSTOP 4X6 (GAUZE/BANDAGES/DRESSINGS) IMPLANT
DURAPREP 26ML APPLICATOR (WOUND CARE) ×1 IMPLANT
ELECT REM PT RETURN 9FT ADLT (ELECTROSURGICAL) ×1
ELECTRODE REM PT RTRN 9FT ADLT (ELECTROSURGICAL) ×1 IMPLANT
GAUZE 4X4 16PLY ~~LOC~~+RFID DBL (SPONGE) IMPLANT
GAUZE SPONGE 4X4 12PLY STRL (GAUZE/BANDAGES/DRESSINGS) ×1 IMPLANT
GLOVE BIO SURGEON STRL SZ 6.5 (GLOVE) ×1 IMPLANT
GLOVE BIO SURGEON STRL SZ7 (GLOVE) IMPLANT
GLOVE BIOGEL PI IND STRL 6.5 (GLOVE) ×1 IMPLANT
GLOVE BIOGEL PI IND STRL 7.5 (GLOVE) IMPLANT
GLOVE ECLIPSE 9.0 STRL (GLOVE) ×1 IMPLANT
GLOVE EXAM NITRILE XL STR (GLOVE) IMPLANT
GOWN STRL REUS W/ TWL LRG LVL3 (GOWN DISPOSABLE) IMPLANT
GOWN STRL REUS W/ TWL XL LVL3 (GOWN DISPOSABLE) ×1 IMPLANT
GOWN STRL REUS W/TWL 2XL LVL3 (GOWN DISPOSABLE) IMPLANT
GOWN STRL REUS W/TWL LRG LVL3 (GOWN DISPOSABLE) ×3
GOWN STRL REUS W/TWL XL LVL3 (GOWN DISPOSABLE) ×1
KIT BASIN OR (CUSTOM PROCEDURE TRAY) ×1 IMPLANT
KIT TURNOVER KIT B (KITS) ×1 IMPLANT
NDL SPNL 22GX3.5 QUINCKE BK (NEEDLE) IMPLANT
NEEDLE HYPO 22GX1.5 SAFETY (NEEDLE) ×1 IMPLANT
NEEDLE SPNL 22GX3.5 QUINCKE BK (NEEDLE) ×1 IMPLANT
NS IRRIG 1000ML POUR BTL (IV SOLUTION) ×1 IMPLANT
PACK LAMINECTOMY NEURO (CUSTOM PROCEDURE TRAY) ×1 IMPLANT
PAD ARMBOARD 7.5X6 YLW CONV (MISCELLANEOUS) ×3 IMPLANT
SOL ELECTROSURG ANTI STICK (MISCELLANEOUS) ×1
SOLUTION ELECTROSURG ANTI STCK (MISCELLANEOUS) ×1 IMPLANT
SPIKE FLUID TRANSFER (MISCELLANEOUS) ×1 IMPLANT
SPONGE SURGIFOAM ABS GEL SZ50 (HEMOSTASIS) ×1 IMPLANT
STRIP CLOSURE SKIN 1/2X4 (GAUZE/BANDAGES/DRESSINGS) ×1 IMPLANT
SUT VIC AB 2-0 CT1 18 (SUTURE) ×1 IMPLANT
SUT VIC AB 3-0 SH 8-18 (SUTURE) ×1 IMPLANT
TOWEL GREEN STERILE (TOWEL DISPOSABLE) ×1 IMPLANT
TOWEL GREEN STERILE FF (TOWEL DISPOSABLE) ×1 IMPLANT
WATER STERILE IRR 1000ML POUR (IV SOLUTION) ×1 IMPLANT

## 2022-10-15 NOTE — OR Nursing (Signed)
Doctor informed of iodine allergy and insisted on using ioban.

## 2022-10-15 NOTE — Progress Notes (Signed)
No new changes or problems.  Patient a little bit better with IV steroids but still with significant sensory loss and weakness in both lower extremities.  Plan for thoracic decompressive surgery later today.  Patient is comfortable with the plan.  No new issues or problems.

## 2022-10-15 NOTE — Transfer of Care (Signed)
Immediate Anesthesia Transfer of Care Note  Patient: Mitchell Morgan  Procedure(s) Performed: DECOMPRESSIVE thoracic laminectomy THORACIC TEN -ELEVEN (Back)  Patient Location: PACU  Anesthesia Type:General  Level of Consciousness: drowsy  Airway & Oxygen Therapy: Patient Spontanous Breathing and Patient connected to nasal cannula oxygen  Post-op Assessment: Report given to RN, Post -op Vital signs reviewed and stable, and Patient moving all extremities  Post vital signs: Reviewed and stable  Last Vitals:  Vitals Value Taken Time  BP 162/73 10/15/22 2054  Temp    Pulse 55 10/15/22 2059  Resp 15 10/15/22 2059  SpO2 100 % 10/15/22 2059  Vitals shown include unvalidated device data.  Last Pain:  Vitals:   10/15/22 1528  TempSrc:   PainSc: 0-No pain      Patients Stated Pain Goal: 2 (10/13/22 2115)  Complications: No notable events documented.

## 2022-10-15 NOTE — Op Note (Signed)
Date of procedure: 10/15/2022  Date of dictation: Same  Service: Neurosurgery  Preoperative diagnosis: T10-T11 stenosis with myelopathy  Postoperative diagnosis: Same  Procedure Name: T10-T11 decompressive laminectomy  Surgeon:Nari Vannatter A.Tytionna Cloyd, M.D.  Asst. Surgeon: Doran Durand, NP, Assistant utilized for exposure, decompression and closure.  Anesthesia: General  Indication: 77 year old male with bilateral lower extremity numbness paresthesias and weakness consistent with a lower thoracic myelopathy.  Workup demonstrates evidence of severe stenosis with spinal cord signal abnormality at the T10-T11 level.  Patient presents now for decompressive surgery in hopes of improving his symptoms.  Operative note: After induction of anesthesia, patient positioned prone onto bolsters and appropriately padded.  Thoracic and lumbar region prepped and draped sterilely.  Localizing x-ray was taken.  Incision made overlying T10 and T11.  Dissection performed bilaterally.  Retractor placed.  X-ray taken.  Level confirmed.  Decompressive laminectomy was then performed using Leksell rongeurs, Kerrison rongeurs and the high-speed drill to remove the entire lamina of T10 and the superior half of the lamina of T11.  The T 9 T10 ligamentum flavum was undercut.  Medial facetectomies were then performed bilaterally using Kerrison rongeurs to fully decompress the canal.  At this point a very thorough decompression had been achieved.  There was no evidence of injury to the thecal sac and nerve roots or spinal cord.  The wound was irrigated.  Surgifoam was placed topically for hemostasis which was found to be good.  A medium Hemovac drain was placed in the epidural space.  Wound is then closed in layers with Vicryl sutures.  Steri-Strips and sterile dressing were applied.  No apparent complications.  Patient tolerated the procedure well and he returns to the recovery room postop.

## 2022-10-15 NOTE — Anesthesia Postprocedure Evaluation (Signed)
Anesthesia Post Note  Patient: Mitchell Morgan  Procedure(s) Performed: DECOMPRESSIVE thoracic laminectomy THORACIC TEN -ELEVEN (Back)     Patient location during evaluation: PACU Anesthesia Type: General Level of consciousness: awake and alert Pain management: pain level controlled Vital Signs Assessment: post-procedure vital signs reviewed and stable Respiratory status: spontaneous breathing, nonlabored ventilation, respiratory function stable and patient connected to nasal cannula oxygen Cardiovascular status: blood pressure returned to baseline and stable Postop Assessment: no apparent nausea or vomiting Anesthetic complications: no   No notable events documented.  Last Vitals:  Vitals:   10/15/22 2228 10/15/22 2229  BP: 139/73   Pulse: (!) 56 60  Resp:    Temp: 36.5 C   SpO2: 99% 100%    Last Pain:  Vitals:   10/15/22 2231  TempSrc:   PainSc: 4                  Sonnie Pawloski P Manasseh Pittsley

## 2022-10-15 NOTE — Plan of Care (Signed)
  Problem: Health Behavior/Discharge Planning: Goal: Ability to manage health-related needs will improve Outcome: Progressing   

## 2022-10-15 NOTE — Anesthesia Preprocedure Evaluation (Addendum)
Anesthesia Evaluation  Patient identified by MRN, date of birth, ID band Patient awake    Reviewed: Allergy & Precautions, NPO status , Patient's Chart, lab work & pertinent test results  Airway Mallampati: III  TM Distance: >3 FB Neck ROM: Full    Dental no notable dental hx.    Pulmonary neg pulmonary ROS   Pulmonary exam normal        Cardiovascular hypertension, Pt. on medications and Pt. on home beta blockers Normal cardiovascular exam     Neuro/Psych Bilateral leg weakness  Neuromuscular disease  negative psych ROS   GI/Hepatic negative GI ROS, Neg liver ROS,,,  Endo/Other  negative endocrine ROS    Renal/GU negative Renal ROS     Musculoskeletal   Abdominal  (+) + obese  Peds  Hematology negative hematology ROS (+)   Anesthesia Other Findings Thoracic stenosis with myelopathy  Reproductive/Obstetrics                             Anesthesia Physical Anesthesia Plan  ASA: 3  Anesthesia Plan: General   Post-op Pain Management:    Induction: Intravenous  PONV Risk Score and Plan: 2 and Ondansetron, Dexamethasone and Treatment may vary due to age or medical condition  Airway Management Planned: Oral ETT  Additional Equipment:   Intra-op Plan:   Post-operative Plan: Extubation in OR  Informed Consent: I have reviewed the patients History and Physical, chart, labs and discussed the procedure including the risks, benefits and alternatives for the proposed anesthesia with the patient or authorized representative who has indicated his/her understanding and acceptance.     Dental advisory given  Plan Discussed with: CRNA  Anesthesia Plan Comments:        Anesthesia Quick Evaluation

## 2022-10-15 NOTE — Anesthesia Procedure Notes (Signed)
Procedure Name: Intubation Date/Time: 10/15/2022 7:25 PM  Performed by: Moody Robben T, CRNAPre-anesthesia Checklist: Patient identified, Emergency Drugs available, Suction available and Patient being monitored Patient Re-evaluated:Patient Re-evaluated prior to induction Oxygen Delivery Method: Circle system utilized Preoxygenation: Pre-oxygenation with 100% oxygen Induction Type: IV induction Ventilation: Mask ventilation without difficulty Laryngoscope Size: Mac and 4 Grade View: Grade I Tube type: Oral Tube size: 7.5 mm Number of attempts: 1 Airway Equipment and Method: Stylet and Oral airway Placement Confirmation: ETT inserted through vocal cords under direct vision, positive ETCO2 and breath sounds checked- equal and bilateral Secured at: 23 cm Tube secured with: Tape Dental Injury: Teeth and Oropharynx as per pre-operative assessment

## 2022-10-15 NOTE — Brief Op Note (Signed)
10/15/2022  8:38 PM  PATIENT:  Mitchell Morgan  77 y.o. male  PRE-OPERATIVE DIAGNOSIS:  Thoracic stenosis with myelopathy  POST-OPERATIVE DIAGNOSIS:  Thoracic stenosis with myelopathy  PROCEDURE:  Procedure(s): DECOMPRESSIVE thoracic laminectomy THORACIC TEN -ELEVEN (N/A)  SURGEON:  Surgeon(s) and Role:    * Julio Sicks, MD - Primary  PHYSICIAN ASSISTANT:   ASSISTANTSMarland Mcalpine   ANESTHESIA:   general  EBL:  150 mL   BLOOD ADMINISTERED:none  DRAINS: (med) Hemovact drain(s) in the epidural space with  Suction Open   LOCAL MEDICATIONS USED:  MARCAINE     SPECIMEN:  No Specimen  DISPOSITION OF SPECIMEN:  N/A  COUNTS:  YES  TOURNIQUET:  * No tourniquets in log *  DICTATION: .Dragon Dictation  PLAN OF CARE: Admit to inpatient   PATIENT DISPOSITION:  PACU - hemodynamically stable.   Delay start of Pharmacological VTE agent (>24hrs) due to surgical blood loss or risk of bleeding: yes

## 2022-10-15 NOTE — Hospital Course (Signed)
PMH of HTN and HLD as well as obesity presented to the hospital with complaints of back pain as well as bilateral numbness.  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  Neurosurgery was consulted and recommended decompressive laminectomy.

## 2022-10-15 NOTE — Progress Notes (Signed)
[facility] Progress Note Patient: Mitchell Morgan ZOX:[id number] DOB: 1945/09/12 DOA: 10/13/2022  DOS: the patient was seen and examined on 10/15/2022  Brief hospital course: PMH of HTN and HLD as well as obesity presented to the hospital with complaints of back pain as well as bilateral numbness.  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  Neurosurgery was consulted and recommended decompressive laminectomy. Assessment and Plan: T10-T11 spinal stenosis Lower thoracic myelopathy. Undergoing decompressive laminectomy on 5/30. Monitor.  Recovery. Management per neurosurgery.  Essential hypertension. Blood pressure stable. Continue current regimen.  Type 2 diabetes mellitus, controlled without any complication without long-term insulin use. Not on medication.  Relative B12 deficiency. B12 195. Will replace with injection.  HLD. Continuing statin.  Constipation. Continue current regimen.  Obesity Body mass index is 31 kg/m.  Placing the pt at higher risk of poor outcomes.   Subjective: No nausea no vomiting.  Pain in the back still unchanged.  No no fever no chills.  Physical Exam: General: in Mild distress, No Rash Cardiovascular: S1 and S2 Present, No Murmur Respiratory: Good respiratory effort, Bilateral Air entry present. No Crackles, No wheezes Abdomen: Bowel Sound present, No tenderness Extremities: No edema Neuro: Alert and oriented x3, no new focal deficit  Data Reviewed: I have Reviewed nursing notes, Vitals, and Lab results. Since last encounter, pertinent lab results CBC and BMP   . I have ordered test including CBC and BMP  .  Disposition: Status is: Inpatient Remains inpatient appropriate because: Need for further postop recovery  SCD's Start: 10/15/22 1517 Place and maintain sequential compression device Start: 10/15/22 0853   Family Communication: No one at bedside Level of care: Med-Surg   Vitals:   10/14/22  1500 10/14/22 2016 10/15/22 0611 10/15/22 1511  BP: (!) 145/88 132/81 132/77 (!) 146/71  Pulse: 76 73 61 60  Resp: 18 17 16 18   Temp: (!) 97.5 F (36.4 C) 98 F (36.7 C) 97.8 F (36.6 C) 98 F (36.7 C)  TempSrc: Oral Oral Oral Oral  SpO2: 95% 97% 98% 98%  Weight:    98 kg  Height:    5\' 10"  (1.778 m)     Author: Lynden Oxford, MD 10/15/2022 4:41 PM  Please look on [email or web address] to find out who is on call.

## 2022-10-16 ENCOUNTER — Encounter (HOSPITAL_COMMUNITY): Payer: Self-pay | Admitting: Neurosurgery

## 2022-10-16 DIAGNOSIS — R29898 Other symptoms and signs involving the musculoskeletal system: Secondary | ICD-10-CM | POA: Diagnosis not present

## 2022-10-16 LAB — CBC
HCT: 41.7 % (ref 39.0–52.0)
Hemoglobin: 14.4 g/dL (ref 13.0–17.0)
MCH: 31.2 pg (ref 26.0–34.0)
MCHC: 34.5 g/dL (ref 30.0–36.0)
MCV: 90.3 fL (ref 80.0–100.0)
Platelets: 275 10*3/uL (ref 150–400)
RBC: 4.62 MIL/uL (ref 4.22–5.81)
RDW: 12.3 % (ref 11.5–15.5)
WBC: 16.5 10*3/uL — ABNORMAL HIGH (ref 4.0–10.5)
nRBC: 0 % (ref 0.0–0.2)

## 2022-10-16 LAB — BASIC METABOLIC PANEL
Anion gap: 9 (ref 5–15)
BUN: 22 mg/dL (ref 8–23)
CO2: 23 mmol/L (ref 22–32)
Calcium: 8.7 mg/dL — ABNORMAL LOW (ref 8.9–10.3)
Chloride: 96 mmol/L — ABNORMAL LOW (ref 98–111)
Creatinine, Ser: 0.9 mg/dL (ref 0.61–1.24)
GFR, Estimated: >60 mL/min (ref >60)
Glucose, Bld: 172 mg/dL — ABNORMAL HIGH (ref 70–99)
Potassium: 4 mmol/L (ref 3.5–5.1)
Sodium: 128 mmol/L — ABNORMAL LOW (ref 135–145)

## 2022-10-16 LAB — GLUCOSE, CAPILLARY
Glucose-Capillary: 125 mg/dL — ABNORMAL HIGH (ref 70–99)
Glucose-Capillary: 213 mg/dL — ABNORMAL HIGH (ref 70–99)
Glucose-Capillary: 137 mg/dL — ABNORMAL HIGH (ref 70–99)
Glucose-Capillary: 147 mg/dL — ABNORMAL HIGH (ref 70–99)

## 2022-10-16 MED ORDER — SORBITOL 70 % SOLN
30.0000 mL | Freq: Once | Status: AC
  Administered 2022-10-16: 30 mL via ORAL
  Filled 2022-10-16: qty 30

## 2022-10-16 MED ORDER — SORBITOL 70 % SOLN: 30.0000 mL | Freq: Every day | Status: DC | PRN

## 2022-10-16 MED ORDER — FLEET ENEMA 7-19 GM/118ML RE ENEM: 1.0000 | ENEMA | Freq: Every day | RECTAL | Status: DC | PRN

## 2022-10-16 MED ORDER — CYANOCOBALAMIN 1000 MCG/ML IJ SOLN
1000.0000 ug | Freq: Once | INTRAMUSCULAR | Status: AC
  Administered 2022-10-16: 1000 ug via SUBCUTANEOUS
  Filled 2022-10-16 (×2): qty 1

## 2022-10-16 MED ORDER — BISACODYL 10 MG RE SUPP
10.0000 mg | Freq: Once | RECTAL | Status: DC
  Filled 2022-10-16: qty 1

## 2022-10-16 MED FILL — Thrombin For Soln 5000 Unit: CUTANEOUS | Qty: 2 | Status: AC

## 2022-10-16 NOTE — Evaluation (Signed)
Physical Therapy Evaluation Patient Details Name: Mitchell Morgan MRN: [id number] DOB: 16-Oct-1945 Today's Date: 10/16/2022  History of Present Illness  Patient is 77 y.o. male s/p decompressive thoracic laminectomy at T10-11 on 10/15/22 due to stenosis w/ myelopathy. PMH significant for HTN.   Clinical Impression  Mitchell Morgan is a 77 y.o. male POD 1 s/p T10-11 laminectomy. Patient reports independent with mobility with no device ~1 month PTA, and gradual worsening of strength and balance leading to use of SPC/RW and 3 falls in last month. Patient is now limited by functional impairments (see PT problem list below) and requires Min assist for transfers and gait with RW. Patient was able to ambulate ~70 feet with RW and min assist; gait limited with poor coordination of foot placement, decreased hip flex/dorsiflexion limiting bil LE clearance with limb advancement, and pt locking bil knees to prevent buckling in stance phase. Patient instructed in spinal precautions, BLT, for safe recovery/healing. Patient will benefit from continued skilled PT interventions to address impairments and progress towards PLOF. Acute PT will follow to progress mobility as able, recommend intense follow up of >3 hours therapy/day to maximize regain of functional independence.     Recommendations for follow up therapy are one component of a multi-disciplinary discharge planning process, led by the attending physician.  Recommendations may be updated based on patient status, additional functional criteria and insurance authorization.  Follow Up Recommendations       Assistance Recommended at Discharge Intermittent Supervision/Assistance  Patient can return home with the following  A little help with walking and/or transfers;A little help with bathing/dressing/bathroom;Assistance with cooking/housework;Assist for transportation;Help with stairs or ramp for entrance    Equipment Recommendations Rolling walker (2 wheels)  (TBD at next venue)  Recommendations for Other Services       Functional Status Assessment Patient has had a recent decline in their functional status and demonstrates the ability to make significant improvements in function in a reasonable and predictable amount of time.     Precautions / Restrictions Precautions Precautions: Fall;Back Precaution Booklet Issued: Yes (comment) Precaution Comments: 3 falls in last month Restrictions Weight Bearing Restrictions: No      Mobility  Bed Mobility               General bed mobility comments: pt OOB with OT at start of session, to recliner at EOS    Transfers Overall transfer level: Needs assistance Equipment used: Rolling walker (2 wheels) Transfers: Sit to/from Stand Sit to Stand: Min assist           General transfer comment: cues for hand placement for safe power up and to maintain spinal precautions of no bending with rise. pt requires bil UE to power up and to control lowering with return to sit.    Ambulation/Gait Ambulation/Gait assistance: Min assist Gait Distance (Feet): 70 Feet Assistive device: Rolling walker (2 wheels) Gait Pattern/deviations: Step-through pattern, Decreased step length - right, Decreased step length - left, Decreased stride length, Decreased dorsiflexion - right, Decreased dorsiflexion - left, Narrow base of support Gait velocity: decr     General Gait Details: min assist to manage RW position and steady balance. cues for position/proximity to RW throughout. pt with impaired foot placement resulting in narrow BOS and limited foot clearance with bil steps.  Stairs            Wheelchair Mobility    Modified Rankin (Stroke Patients Only)       Balance Overall balance assessment: Needs  assistance, History of Falls Sitting-balance support: Feet supported Sitting balance-Leahy Scale: Fair     Standing balance support: Reliant on assistive device for balance, During functional  activity, Bilateral upper extremity supported Standing balance-Leahy Scale: Poor Standing balance comment: heavy reliance on bil UE support                             Pertinent Vitals/Pain Pain Assessment Pain Assessment: 0-10 Pain Score: 5  Pain Location: back ache, leg numbness Pain Descriptors / Indicators: Discomfort, Other (Comment) (Feels like there is a board across his back) Pain Intervention(s): Limited activity within patient's tolerance, Monitored during session, RN gave pain meds during session, Repositioned    Home Living Family/patient expects to be discharged to:: Private residence Living Arrangements: Spouse/significant other Available Help at Discharge: Family;Available PRN/intermittently Type of Home: House (condo) Home Access: Level entry;Elevator;Stairs to enter Entrance Stairs-Rails: None Entrance Stairs-Number of Steps: 1 step to get into son's home   Home Layout: One level Home Equipment: Standard Walker;Cane - single point Additional Comments: Spends part of time in mother-in-law's apartment or at [facility] [employer] in [place]. House is one level with one step in from garage. Walk-in shower shower with built-in seat and comfort height toilet.    Prior Function Prior Level of Function : Independent/Modified Independent             Mobility Comments: Previously independent; started using SPC started 4/30 about 3 days later started using standard walker ADLs Comments: Previously independent and was driving     Hand Dominance   Dominant Hand: Right    Extremity/Trunk Assessment   Upper Extremity Assessment Upper Extremity Assessment: Defer to OT evaluation    Lower Extremity Assessment Lower Extremity Assessment: RLE deficits/detail;LLE deficits/detail RLE Deficits / Details: 4-/5 at best for hip flex & knee ext, knee flex 3+/5, DF/PF 3+/5 RLE Sensation: decreased proprioception;decreased light touch LLE Deficits / Details: 4-/5 at best  for hip flex & knee ext, knee flex 3+/5, DF/PF 3+/5 LLE Sensation: decreased proprioception;decreased light touch    Cervical / Trunk Assessment Cervical / Trunk Assessment: Normal;Back Surgery (flat back)  Communication   Communication: No difficulties  Cognition Arousal/Alertness: Awake/alert Behavior During Therapy: WFL for tasks assessed/performed Overall Cognitive Status: Within Functional Limits for tasks assessed                                          General Comments      Exercises     Assessment/Plan    PT Assessment Patient needs continued PT services  PT Problem List Decreased strength;Decreased range of motion;Decreased activity tolerance;Decreased balance;Decreased mobility;Decreased coordination;Decreased knowledge of use of DME;Decreased safety awareness;Decreased knowledge of precautions;Impaired sensation;Obesity;Pain       PT Treatment Interventions DME instruction;Gait training;Stair training;Functional mobility training;Therapeutic activities;Therapeutic exercise;Balance training;Neuromuscular re-education;Cognitive remediation;Patient/family education    PT Goals (Current goals can be found in the Care Plan section)  Acute Rehab PT Goals Patient Stated Goal: to regain maximum independence prior to return home PT Goal Formulation: With patient Time For Goal Achievement: 10/23/22 Potential to Achieve Goals: Good    Frequency Min 5X/week     Co-evaluation               AM-PAC PT "6 Clicks" Mobility  Outcome Measure Help needed turning from your back to your side while in a  flat bed without using bedrails?: A Little Help needed moving from lying on your back to sitting on the side of a flat bed without using bedrails?: A Lot Help needed moving to and from a bed to a chair (including a wheelchair)?: A Little Help needed standing up from a chair using your arms (e.g., wheelchair or bedside chair)?: A Little Help needed to walk in  hospital room?: A Little Help needed climbing 3-5 steps with a railing? : A Lot 6 Click Score: 16    End of Session Equipment Utilized During Treatment: Gait belt Activity Tolerance: Patient tolerated treatment well Patient left: in chair;with call bell/phone within reach (RN aware now alarm) Nurse Communication: Mobility status PT Visit Diagnosis: Unsteadiness on feet (R26.81);Other abnormalities of gait and mobility (R26.89);History of falling (Z91.81);Muscle weakness (generalized) (M62.81);Difficulty in walking, not elsewhere classified (R26.2);Other symptoms and signs involving the nervous system (R29.898);Pain Pain - Right/Left:  (back) Pain - part of body:  (back)    Time: 1010-1038 PT Time Calculation (min) (ACUTE ONLY): 28 min   Charges:   PT Evaluation $PT Eval Moderate Complexity: 1 Mod PT Treatments $Gait Training: 8-22 mins        Wynn Maudlin, DPT Acute Rehabilitation Services Office [phone]  10/16/22 11:39 AM

## 2022-10-16 NOTE — Progress Notes (Signed)
Postop day 1.  Patient overall doing well.  Minimal pain.  States that he has better feeling in his lower extremities.  He has not been out of bed yet.  He is afebrile.  His vital signs are stable.  He is voiding well.  He is awake and alert.  He is oriented and appropriate.  Motor examination reveals essentially intact motor strength in both upper and lower extremities which is significant improved from preop.  Sensory exam is also improved with some mild distal sensory loss in both feet.  Wound is clean and dry.  Chest and abdomen are benign.  Overall doing well following thoracic decompressive surgery.  Mobilize with therapy.  The side of her the next day or 2 with regard to plan for discharge home versus inpatient rehabilitation.

## 2022-10-16 NOTE — Evaluation (Signed)
Occupational Therapy Evaluation Patient Details Name: Mitchell Morgan MRN: [id number] DOB: May 13, 1946 Today's Date: 10/16/2022   History of Present Illness Patient is 77 y.o. male s/p decompressive thoracic laminectomy at T10-11 on 10/15/22 due to stenosis w/ myelopathy. PMH significant for HTN.   Clinical Impression   At baseline pt is Independent with ADLs, IADLs, and functional mobility without an AD, and drives. Pt with decreased safety and independence with ADLs and functional mobility over past month secondary to diagnosis above. Pt presents with decreased activity tolerance, decreased balance during functional tasks, and decreased safety and independence with ADLs, functional mobility, and functional transfers. Pt also present with decreased knowledge of precautions. Pt currently demonstrates ability to complete UB ADLs Independent to Min guard assist, LB ADLs with Min assist, and functional mobility/transfers with a RW (2 wheel) with Min assist. Pt will benefit from acute skilled OT services to address deficits outlined below and to increase safety and independence with ADLs and functional mobility/transfers. Post acute discharge, pt will benefit from intensive inpatient skilled OT services > 3 hours per day to maximize rehab potential.      Recommendations for follow up therapy are one component of a multi-disciplinary discharge planning process, led by the attending physician.  Recommendations may be updated based on patient status, additional functional criteria and insurance authorization.   Assistance Recommended at Discharge Intermittent Supervision/Assistance  Patient can return home with the following A little help with walking and/or transfers;A little help with bathing/dressing/bathroom;Assistance with cooking/housework;Assist for transportation;Help with stairs or ramp for entrance    Functional Status Assessment  Patient has had a recent decline in their functional status and  demonstrates the ability to make significant improvements in function in a reasonable and predictable amount of time.  Equipment Recommendations  Other (comment) (Defer to next level of care)    Recommendations for Other Services Rehab consult     Precautions / Restrictions Precautions Precautions: Fall;Back Precaution Booklet Issued: Yes (comment) Precaution Comments: 3 falls in last month Restrictions Weight Bearing Restrictions: No      Mobility Bed Mobility Overal bed mobility: Needs Assistance Bed Mobility: Supine to Sit     Supine to sit: Min assist     General bed mobility comments: with min cues needed for technique and following back precautions    Transfers Overall transfer level: Needs assistance Equipment used: Rolling walker (2 wheels) Transfers: Bed to chair/wheelchair/BSC, Sit to/from Stand Sit to Stand: Min assist     Step pivot transfers: Min assist     General transfer comment: Pt requires cues for hand placement for safe power up and to maintain spinal precautions during functional transfers with use of RW. pt requires B UE to power up and to control lowering with return to sit.      Balance Overall balance assessment: Needs assistance, History of Falls Sitting-balance support: Feet supported Sitting balance-Leahy Scale: Fair     Standing balance support: Single extremity supported, Bilateral upper extremity supported, During functional activity, Reliant on assistive device for balance Standing balance-Leahy Scale: Poor                             ADL either performed or assessed with clinical judgement   ADL Overall ADL's : Needs assistance/impaired Eating/Feeding: Independent   Grooming: Wash/dry hands;Wash/dry face;Oral care;Min guard;Standing   Upper Body Bathing: Min guard;Cueing for safety;Cueing for sequencing;Cueing for compensatory techniques;Sitting (adhereing to back precautions)   Lower Body Bathing:  Minimal  assistance;Cueing for safety;Cueing for compensatory techniques;Cueing for back precautions;Sit to/from stand   Upper Body Dressing : Min guard;Sitting   Lower Body Dressing: Minimal assistance;Cueing for safety;Cueing for compensatory techniques;Cueing for back precautions;Sit to/from stand   Toilet Transfer: Min guard;Cueing for safety;Regular Toilet;Grab bars;Rolling walker (2 wheels) (cues for following back precautions)   Toileting- Clothing Manipulation and Hygiene: Min guard;Cueing for safety;Cueing for compensatory techniques;Cueing for back precautions;Sit to/from stand       Functional mobility during ADLs: Minimal assistance;Cueing for safety;Rolling walker (2 wheels) General ADL Comments: Instructed pt in back precautions during functional tasks with pt verbalizing understanding and requiring min cues to follow precautions consistently. Pt presents with decreased activity tolerance.     Vision Baseline Vision/History: 1 Wears glasses (wears glasses/contacts for reading) Ability to See in Adequate Light: 0 Adequate Patient Visual Report: No change from baseline       Perception     Praxis Praxis Praxis tested?: Within functional limits    Pertinent Vitals/Pain Pain Assessment Pain Assessment: Faces Faces Pain Scale: Hurts little more Pain Location: back ache, leg numbness Pain Descriptors / Indicators: Discomfort, Other (Comment) (Feels like there is a board across his back) Pain Intervention(s): Limited activity within patient's tolerance, Monitored during session, Repositioned, Patient requesting pain meds-RN notified     Hand Dominance Right   Extremity/Trunk Assessment Upper Extremity Assessment Upper Extremity Assessment: Overall WFL for tasks assessed (Mild decrease in strength in Left shoulder scondary to Left rotator cuff injury >10 years ago. Pt B UE strength and ROM WFL and at baseline.)   Lower Extremity Assessment Lower Extremity Assessment: Defer to  PT evaluation RLE Deficits / Details: 4-/5 at best for hip flex & knee ext, knee flex 3+/5, DF/PF 3+/5 RLE Sensation: decreased proprioception;decreased light touch LLE Deficits / Details: 4-/5 at best for hip flex & knee ext, knee flex 3+/5, DF/PF 3+/5 LLE Sensation: decreased proprioception;decreased light touch   Cervical / Trunk Assessment Cervical / Trunk Assessment: Normal;Back Surgery (flat back)   Communication Communication Communication: No difficulties   Cognition Arousal/Alertness: Awake/alert Behavior During Therapy: WFL for tasks assessed/performed Overall Cognitive Status: Within Functional Limits for tasks assessed                                       General Comments  VSS on RA throughout session.    Exercises     Shoulder Instructions      Home Living Family/patient expects to be discharged to:: Private residence Living Arrangements: Spouse/significant other Available Help at Discharge: Family;Available PRN/intermittently;Other (Comment) (Pt's wife recently had surgery and is able to assist pt minimally. Pt's son is available to assist PRN.) Type of Home: House (condo) Home Access: Level entry;Elevator (at [employer]) [employer] of Steps: 1 step to get into son's home Entrance Stairs-Rails: None Home Layout: One level     Bathroom Shower/Tub: Walk-in shower (small lip to step over in shower in condo)   Bathroom Toilet: Handicapped height (at [employer]) Bathroom Accessibility: Yes How Accessible: Accessible via walker (at [employer]) Home Equipment: Standard Walker;Cane - single point;Other (comment) (No shower seat in condo. Built-in shower seat in mother-in-law apartment in son's house)   Additional Comments: Pt spends part of time living in a mother-in-law apartment the family built within pt's son's house in [place]. Son's house/mother-in-law apartment is one level with one step in from garage. Mother-in-law apartment has walk-in shower  with built-in seat and comfort height toilet and is accessible with a RW.      Prior Functioning/Environment Prior Level of Function : Independent/Modified Independent             Mobility Comments: Previously independent; started using SPC started 4/30 about 3 days later started using standard walker ADLs Comments: Previously independent and was driving. Pt reports he begain needing Supervision to Min guard for shower transfers and LB ADLs after 09/15/22.        OT Problem List: Decreased activity tolerance;Impaired balance (sitting and/or standing);Decreased knowledge of use of DME or AE;Decreased knowledge of precautions;Impaired sensation;Pain      OT Treatment/Interventions: Self-care/ADL training;Energy conservation;DME and/or AE instruction;Therapeutic activities;Patient/family education;Balance training    OT Goals(Current goals can be found in the care plan section) Acute Rehab OT Goals Patient Stated Goal: To be independent again, to not be a burden on his wife or son OT Goal Formulation: With patient Time For Goal Achievement: 10/30/22 Potential to Achieve Goals: Good ADL Goals Pt Will Perform Upper Body Bathing: with modified independence;sitting (with adaptive equipment as needed and with no cues needed for following back precautions) Pt Will Perform Lower Body Bathing: sit to/from stand;with modified independence (with adaptive equipment as needed and with no cues needed for following back precautions) Pt Will Perform Lower Body Dressing: sit to/from stand;with modified independence (with adaptive equipment as needed and with no cues needed for following back precautions) Pt Will Transfer to Toilet: with modified independence;ambulating;regular height toilet (with least restrictive AD and with no cues needed for following back precautions) Pt Will Perform Toileting - Clothing Manipulation and hygiene: with modified independence;sit to/from stand (with no cues needed for  following back precautions)  OT Frequency: Min 2X/week    Co-evaluation              AM-PAC OT "6 Clicks" Daily Activity     Outcome Measure Help from another person eating meals?: None Help from another person taking care of personal grooming?: A Little (in standing) Help from another person toileting, which includes using toliet, bedpan, or urinal?: A Little Help from another person bathing (including washing, rinsing, drying)?: A Little Help from another person to put on and taking off regular upper body clothing?: A Little Help from another person to put on and taking off regular lower body clothing?: A Little 6 Click Score: 19   End of Session Equipment Utilized During Treatment: Gait belt;Rolling walker (2 wheels) Nurse Communication: Mobility status;Other (comment);Patient requests pain meds  Activity Tolerance: Patient tolerated treatment well Patient left: Other (comment) (in standing with RW with assist of PT and with RN present)  OT Visit Diagnosis: Unsteadiness on feet (R26.81);Repeated falls (R29.6);Muscle weakness (generalized) (M62.81);Other abnormalities of gait and mobility (R26.89);History of falling (Z91.81);Other (comment) (Decreased activity tolerance; Decreased sitting/standing balance during functional tasks)                Time: 4098-1191 OT Time Calculation (min): 45 min Charges:  OT General Charges $OT Visit: 1 Visit OT Evaluation $OT Eval Low Complexity: 1 Low OT Treatments $Self Care/Home Management : 8-22 mins  Quentez Lober "Orson Eva., OTR/L, MA Acute Rehab [phone]   Lendon Colonel 10/16/2022, 12:09 PM

## 2022-10-16 NOTE — Progress Notes (Signed)
[facility] PROGRESS NOTE  Patient: Mitchell Morgan UEA:[id number]   PCP: Andreas Blower., MD DOB: Oct 23, 1945   DOA: 10/13/2022   DOS: 10/16/2022    Subjective: Continues to report constipation.  Passing gas.  No nausea no vomiting.  Abdominal pain.  Objective:  Vitals:   10/16/22 0552 10/16/22 0735 10/16/22 1158 10/16/22 1621  BP: 134/74 124/66 134/77 138/70  Pulse: 62 (!) 55 77 (!) 59  Resp:  16 16 16   Temp: (!) 97.3 F (36.3 C) (!) 97.4 F (36.3 C)  97.7 F (36.5 C)  TempSrc: Oral Oral  Oral  SpO2: 99% 97% 97% 97%  Weight:      Height:       Bowel sound present. No edema.  Assessment and plan: Constipation. Will treat with aggressive regimen.  HTN. Blood pressure stable pretension Type 2 diabetes mellitus, controlled. Continue sliding scale insulin.  Author: Lynden Oxford, MD [facility] 10/16/2022 5:40 PM   If 7PM-7AM, please contact night-coverage at [email or web address]

## 2022-10-17 DIAGNOSIS — R29898 Other symptoms and signs involving the musculoskeletal system: Secondary | ICD-10-CM | POA: Diagnosis not present

## 2022-10-17 LAB — GLUCOSE, CAPILLARY
Glucose-Capillary: 148 mg/dL — ABNORMAL HIGH (ref 70–99)
Glucose-Capillary: 116 mg/dL — ABNORMAL HIGH (ref 70–99)
Glucose-Capillary: 166 mg/dL — ABNORMAL HIGH (ref 70–99)
Glucose-Capillary: 165 mg/dL — ABNORMAL HIGH (ref 70–99)

## 2022-10-17 NOTE — Progress Notes (Signed)
Patient ID: Mitchell Morgan, male   DOB: 01/09/1946, 77 y.o.   MRN: [id number] BP (!) 141/66 (BP Location: Right Arm)   Pulse (!) 56   Temp 97.6 F (36.4 C) (Oral)   Resp 18   Ht 5\' 10"  (1.778 m)   Wt 98 kg   SpO2 97%   BMI 31.00 kg/m  Alert and oriented x 4, speech is clear and fluent Believes he needs more time with PT, very unsure about waLking. Will try to find out why PT does not believe he is a good candidate for CIR. Moving all extremities

## 2022-10-17 NOTE — Progress Notes (Signed)
Inpatient Rehab Admissions:  Inpatient Rehab Consult received.  I met with patient at the bedside for rehabilitation assessment and to discuss goals and expectations of an inpatient rehab admission.  Discussed average length of stay, insurance authorization requirement, discharge home after completion of CIR. Pt acknowledged understanding. t interested in pursuing CIR. Pt gave permission to contact wife. Spoke with Darel Hong on the telephone. She also acknowledged understanding. She is supportive of pt pursuing CIR. She will be able to provide 24/7 supervision for pt after discharge.  After seeing pt and speaking with wife, PT contacted [facility] to inform [facility] that pt did very well during therapy today. Pt appears to be too functional to warrant the intensity of CIR. Will continue to follow and will update TOC.  Signed: Wolfgang Phoenix, MS, CCC-SLP Admissions Coordinator [phone]

## 2022-10-17 NOTE — Progress Notes (Signed)
Called to pt room by pt, pt on commode tube to hemovac noted on bed, the small tube tip in place, under honey coob drsg drainage noted. Perrin Maltese MD notified, order to notified surgery. Neurosurgery Dr Franky Macho notified. Order to put in the small tube tip to hemovac drain, tube cleaned, in place and draining . Pt denies pain or discomfort. No comfort noted.

## 2022-10-17 NOTE — Progress Notes (Signed)
[facility] PROGRESS NOTE  Patient: Mitchell Morgan WJX:[id number]   PCP: Andreas Blower., MD DOB: 25-Mar-1946   DOA: 10/13/2022   DOS: 10/17/2022    Subjective: Continues to have some back pain but improving.  Constipation resolved.  No nausea or vomiting.  Objective:  Vitals:   10/16/22 2033 10/17/22 0649 10/17/22 0817 10/17/22 1520  BP: 129/81 130/73 134/74 136/74  Pulse: 90 (!) 55 (!) 54 (!) 56  Resp:   16 16  Temp: 98.7 F (37.1 C)  97.7 F (36.5 C) 98 F (36.7 C)  TempSrc: Oral  Oral Oral  SpO2: 97% 97% 99% 98%  Weight:      Height:       Clear to auscultation. S1-S2 present Abdomen tenderness. No edema. Still has a drain with bloody discharge.  Assessment and plan: Constipation. Continue current regimen.  HTN. Blood pressure improving.  Diabetes mellitus, controlled. Continue sliding scale insulin.  PT OT recommending CIR.  Medically stable..  Currently awaiting CIR arrangements.  Author: Lynden Oxford, MD [facility] 10/17/2022 6:14 PM   If 7PM-7AM, please contact night-coverage at [email or web address]

## 2022-10-17 NOTE — Progress Notes (Signed)
Physical Therapy Treatment Patient Details Name: Mitchell Morgan MRN: [id number] DOB: 1945/12/14 Today's Date: 10/17/2022   History of Present Illness Patient is 77 y.o. male s/p decompressive thoracic laminectomy at T10-11 on 10/15/22 due to stenosis w/ myelopathy. PMH significant for HTN.    PT Comments    Pt progressing well with mobility and is very motivated to improve and resume former activities.     Recommendations for follow up therapy are one component of a multi-disciplinary discharge planning process, led by the attending physician.  Recommendations may be updated based on patient status, additional functional criteria and insurance authorization.  Follow Up Recommendations       Assistance Recommended at Discharge Intermittent Supervision/Assistance  Patient can return home with the following A little help with walking and/or transfers;A little help with bathing/dressing/bathroom;Assistance with cooking/housework;Assist for transportation;Help with stairs or ramp for entrance   Equipment Recommendations  Rolling walker (2 wheels)    Recommendations for Other Services       Precautions / Restrictions Precautions Precautions: Fall;Back Restrictions Weight Bearing Restrictions: No     Mobility  Bed Mobility Overal bed mobility: Needs Assistance Bed Mobility: Rolling, Sidelying to Sit Rolling: Supervision (cues for log rolling) Sidelying to sit: Supervision (cues for log rolling technique)            Transfers Overall transfer level: Needs assistance Equipment used: Rolling walker (2 wheels) Transfers: Sit to/from Stand Sit to Stand: Min guard (pt using UEs to help power up)           General transfer comment: VCs for safest technique    Ambulation/Gait Ambulation/Gait assistance: Min guard Gait Distance (Feet): 200 Feet Assistive device: Rolling walker (2 wheels) Gait Pattern/deviations: Step-through pattern, Decreased stride length, Narrow base of  support (decreased coordination of LEs with gait)       General Gait Details: no loss of balance with gait. Was fatigued after walking.   Stairs             Wheelchair Mobility    Modified Rankin (Stroke Patients Only)       Balance Overall balance assessment: Needs assistance, History of Falls         Standing balance support: Bilateral upper extremity supported Standing balance-Leahy Scale: Poor Standing balance comment: Relies on UE's to assist with balance                            Cognition Arousal/Alertness: Awake/alert Behavior During Therapy: WFL for tasks assessed/performed Overall Cognitive Status: Within Functional Limits for tasks assessed                                          Exercises      General Comments General comments (skin integrity, edema, etc.): no fmialy present during session      Pertinent Vitals/Pain Pain Assessment Pain Assessment: 0-10 Pain Score: 4  Pain Location: back Pain Descriptors / Indicators: Discomfort, Sore Pain Intervention(s): Limited activity within patient's tolerance, Monitored during session    Home Living   Living Arrangements: Spouse/significant other                      Prior Function            PT Goals (current goals can now be found in the care plan section) Progress towards PT  goals: Progressing toward goals    Frequency           PT Plan Current plan remains appropriate    Co-evaluation              AM-PAC PT "6 Clicks" Mobility   Outcome Measure  Help needed turning from your back to your side while in a flat bed without using bedrails?: A Little Help needed moving from lying on your back to sitting on the side of a flat bed without using bedrails?: A Little Help needed moving to and from a bed to a chair (including a wheelchair)?: A Little Help needed standing up from a chair using your arms (e.g., wheelchair or bedside chair)?: A  Little Help needed to walk in hospital room?: A Little Help needed climbing 3-5 steps with a railing? : A Lot 6 Click Score: 17    End of Session Equipment Utilized During Treatment: Gait belt Activity Tolerance: Patient tolerated treatment well Patient left: in chair;with call bell/phone within reach   PT Visit Diagnosis: Unsteadiness on feet (R26.81);History of falling (Z91.81)     Time: 1610-9604 PT Time Calculation (min) (ACUTE ONLY): 24 min  Charges:  $Gait Training: 23-37 mins                     Mitchell Morgan, PT   Acute Rehabilitation Services  Office [phone] 10/17/2022    Mitchell Morgan 10/17/2022, 12:08 PM

## 2022-10-18 DIAGNOSIS — R29898 Other symptoms and signs involving the musculoskeletal system: Secondary | ICD-10-CM | POA: Diagnosis not present

## 2022-10-18 LAB — GLUCOSE, CAPILLARY
Glucose-Capillary: 154 mg/dL — ABNORMAL HIGH (ref 70–99)
Glucose-Capillary: 191 mg/dL — ABNORMAL HIGH (ref 70–99)
Glucose-Capillary: 155 mg/dL — ABNORMAL HIGH (ref 70–99)
Glucose-Capillary: 154 mg/dL — ABNORMAL HIGH (ref 70–99)

## 2022-10-18 MED ORDER — SORBITOL 70 % SOLN
30.0000 mL | Freq: Every day | Status: DC
  Administered 2022-10-18 – 2022-10-22 (×4): 30 mL via ORAL
  Filled 2022-10-18 (×5): qty 30

## 2022-10-18 MED ORDER — HYDROCORTISONE (PERIANAL) 2.5 % EX CREA
TOPICAL_CREAM | Freq: Four times a day (QID) | CUTANEOUS | Status: DC
  Filled 2022-10-18: qty 28.35

## 2022-10-18 NOTE — Progress Notes (Signed)
[facility] PROGRESS NOTE  Patient: Mitchell Morgan ZOX:[id number]   PCP: Andreas Blower., MD DOB: 02/02/1946   DOA: 10/13/2022   DOS: 10/18/2022    Subjective: No nausea no vomiting.  No BM so far today.  Tolerating oral diet.  Objective:  Vitals:   10/17/22 1520 10/17/22 1958 10/18/22 0510 10/18/22 0730  BP: 136/74 (!) 141/66 (!) 153/82 138/79  Pulse: (!) 56  (!) 55 61  Resp: 16 18 18 16   Temp: 98 F (36.7 C) 97.6 F (36.4 C) 97.6 F (36.4 C) 97.9 F (36.6 C)  TempSrc: Oral Oral Oral Oral  SpO2: 98% 97% 97% 95%  Weight:      Height:       Clear to auscultation. Shortness of breath. Trace edema.  Assessment and plan: Constipation. Continue current regimen add sorbitol no scheduled basis.  Possible hemorrhoids. Will add Anusol cream.  HTN. Blood pressure stable. Continue current regimen.  Type 2 diabetes mellitus. Continue current sliding scale.  Dispo.  Medically stable.  Awaiting CIR placement.  Author: Lynden Oxford, MD [facility] 10/18/2022 4:08 PM   If 7PM-7AM, please contact night-coverage at [email or web address]

## 2022-10-18 NOTE — Progress Notes (Signed)
Physical Therapy Treatment Patient Details Name: Mitchell Morgan MRN: [id number] DOB: 14-Dec-1945 Today's Date: 10/18/2022   History of Present Illness Patient is 77 y.o. male s/p decompressive thoracic laminectomy at T10-11 on 10/15/22 due to stenosis w/ myelopathy. PMH significant for HTN.    PT Comments    Pt able to ambulate with RW however places 75% of the weight through his UEs. Focused on this session with improving bilat LE weightbearing via pre-gait exercises and short distance ambulation. Pt with noted truncal ataxia, bilat foot neuropathy and weakness. Pt was independent without AD up until 5 weeks ago when he "woke up and couldn't walk". Pt very motivated and desires to "walk out of here with the least assist as possible. Even with a cane if I need to." Pt is an excellent AIR candidate and demonstrates excellent rehab potential to achieve safe supervision level of function with AD. Acute PT to cont to follow.    Recommendations for follow up therapy are one component of a multi-disciplinary discharge planning process, led by the attending physician.  Recommendations may be updated based on patient status, additional functional criteria and insurance authorization.  Follow Up Recommendations       Assistance Recommended at Discharge Frequent or constant Supervision/Assistance  Patient can return home with the following Assistance with cooking/housework;Assist for transportation;Help with stairs or ramp for entrance;A lot of help with walking and/or transfers;A lot of help with bathing/dressing/bathroom   Equipment Recommendations  Rolling walker (2 wheels)    Recommendations for Other Services       Precautions / Restrictions Precautions Precautions: Fall;Back Precaution Booklet Issued: Yes (comment) Precaution Comments: pt unable to recall any back precautions Restrictions Weight Bearing Restrictions: No     Mobility  Bed Mobility Overal bed mobility: Needs  Assistance Bed Mobility: Rolling, Sit to Sidelying Rolling: Supervision (cues for log rolling) Sidelying to sit:  (pt up in chair)     Sit to sidelying: Min assist General bed mobility comments: min assist for LE management back into bed, directional verbal cues to adhere to log roll    Transfers Overall transfer level: Needs assistance Equipment used: Rolling walker (2 wheels) Transfers: Sit to/from Stand Sit to Stand: Min guard (pt using UEs to help power up)           General transfer comment: pt with noted truncal ataxia, depends on hands, decreased proprioception in LEs limiting ability to push up    Ambulation/Gait Ambulation/Gait assistance: Min assist, Mod assist Gait Distance (Feet): 50 Feet Assistive device: Rolling walker (2 wheels) Gait Pattern/deviations: Step-through pattern, Decreased stride length, Narrow base of support (decreased coordination of LEs with gait) Gait velocity: decr   Pre-gait activities: worked on weight shifting L/R in walker without UE support, min/modA to prevent fall, progressed to alternating raising heels during weight shift requiring modA and then holding on to walker with L hand Only and stepping forward/backwards with R foot and then holding with R hand only and stepping forward/backwards with L foot only. Pt with reports of buring thigh pain and that his feet feel super heavy and thick. pt could only do 5 reps prior to needing to sit General Gait Details: pt placing about 75% of weight on UEs, focused on trying to put more weight through LEs however pt very unsteady with poor trunk control moving ant/post at hips trying to catch balance requiring min to modA to prevent fall   Stairs  Wheelchair Mobility    Modified Rankin (Stroke Patients Only)       Balance Overall balance assessment: Needs assistance, History of Falls Sitting-balance support: Feet supported Sitting balance-Leahy Scale: Fair     Standing  balance support: Bilateral upper extremity supported Standing balance-Leahy Scale: Poor Standing balance comment: Relies on UE's to assist with balance                            Cognition Arousal/Alertness: Awake/alert Behavior During Therapy: WFL for tasks assessed/performed Overall Cognitive Status: Within Functional Limits for tasks assessed                                          Exercises Other Exercises Other Exercises: see pre-gait activity section    General Comments General comments (skin integrity, edema, etc.): VSS, hemovac drain intact      Pertinent Vitals/Pain Pain Assessment Pain Assessment: 0-10 Pain Score: 5  Pain Location: burning in bilat thighs Pain Descriptors / Indicators: Burning    Home Living                          Prior Function            PT Goals (current goals can now be found in the care plan section) Acute Rehab PT Goals Patient Stated Goal: to regain maximum independence prior to return home PT Goal Formulation: With patient Time For Goal Achievement: 10/23/22 Potential to Achieve Goals: Good Progress towards PT goals: Progressing toward goals    Frequency    Min 5X/week      PT Plan Current plan remains appropriate    Co-evaluation              AM-PAC PT "6 Clicks" Mobility   Outcome Measure  Help needed turning from your back to your side while in a flat bed without using bedrails?: A Little Help needed moving from lying on your back to sitting on the side of a flat bed without using bedrails?: A Little Help needed moving to and from a bed to a chair (including a wheelchair)?: A Little Help needed standing up from a chair using your arms (e.g., wheelchair or bedside chair)?: A Lot Help needed to walk in hospital room?: Total Help needed climbing 3-5 steps with a railing? : Total 6 Click Score: 13    End of Session Equipment Utilized During Treatment: Gait belt Activity  Tolerance: Patient tolerated treatment well Patient left: with call bell/phone within reach;in bed;with bed alarm set Nurse Communication: Mobility status PT Visit Diagnosis: Unsteadiness on feet (R26.81);History of falling (Z91.81) Pain - Right/Left:  (back) Pain - part of body:  (back)     Time: 1610-9604 PT Time Calculation (min) (ACUTE ONLY): 33 min  Charges:  $Gait Training: 23-37 mins                     Lewis Shock, PT, DPT Acute Rehabilitation Services Secure chat preferred Office #: [phone]    Iona Hansen 10/18/2022, 1:41 PM

## 2022-10-18 NOTE — Progress Notes (Signed)
Patient ID: Mitchell Morgan, male   DOB: 25-Aug-1945, 77 y.o.   MRN: [id number] BP 138/79 (BP Location: Right Arm)   Pulse 61   Temp 97.9 F (36.6 C) (Oral)   Resp 16   Ht 5\' 10"  (1.778 m)   Wt 98 kg   SpO2 95%   BMI 31.00 kg/m  Alert and oriented x 4, speech is clear and fluent Moving all extremities well Removed hemovac drain.  CIR is now recommended Will await rehab consult.

## 2022-10-19 DIAGNOSIS — R29898 Other symptoms and signs involving the musculoskeletal system: Secondary | ICD-10-CM | POA: Diagnosis not present

## 2022-10-19 LAB — CBC
HCT: 36.3 % — ABNORMAL LOW (ref 39.0–52.0)
Hemoglobin: 12.8 g/dL — ABNORMAL LOW (ref 13.0–17.0)
MCH: 32.7 pg (ref 26.0–34.0)
MCHC: 35.3 g/dL (ref 30.0–36.0)
MCV: 92.6 fL (ref 80.0–100.0)
Platelets: 193 10*3/uL (ref 150–400)
RBC: 3.92 MIL/uL — ABNORMAL LOW (ref 4.22–5.81)
RDW: 12.2 % (ref 11.5–15.5)
WBC: 9 10*3/uL (ref 4.0–10.5)
nRBC: 0 % (ref 0.0–0.2)

## 2022-10-19 LAB — GLUCOSE, CAPILLARY
Glucose-Capillary: 148 mg/dL — ABNORMAL HIGH (ref 70–99)
Glucose-Capillary: 179 mg/dL — ABNORMAL HIGH (ref 70–99)
Glucose-Capillary: 153 mg/dL — ABNORMAL HIGH (ref 70–99)
Glucose-Capillary: 184 mg/dL — ABNORMAL HIGH (ref 70–99)

## 2022-10-19 LAB — BASIC METABOLIC PANEL
Anion gap: 9 (ref 5–15)
BUN: 22 mg/dL (ref 8–23)
CO2: 24 mmol/L (ref 22–32)
Calcium: 8.3 mg/dL — ABNORMAL LOW (ref 8.9–10.3)
Chloride: 98 mmol/L (ref 98–111)
Creatinine, Ser: 0.76 mg/dL (ref 0.61–1.24)
GFR, Estimated: >60 mL/min (ref >60)
Glucose, Bld: 162 mg/dL — ABNORMAL HIGH (ref 70–99)
Potassium: 4.7 mmol/L (ref 3.5–5.1)
Sodium: 131 mmol/L — ABNORMAL LOW (ref 135–145)

## 2022-10-19 MED ORDER — VITAMIN B-12 1000 MCG PO TABS
1000.0000 ug | ORAL_TABLET | Freq: Every day | ORAL | Status: DC
  Administered 2022-10-19 – 2022-10-22 (×4): 1000 ug via ORAL
  Filled 2022-10-19 (×4): qty 1

## 2022-10-19 NOTE — Progress Notes (Signed)
[facility] PROGRESS NOTE  Patient: Mitchell Morgan ION:[id number]   PCP: Andreas Blower., MD DOB: 1946/04/23   DOA: 10/13/2022   DOS: 10/19/2022    Subjective: Pain better.  No nausea or vomiting no fever no chills.  Objective:  Vitals:   10/18/22 2030 10/19/22 0412 10/19/22 0738 10/19/22 1513  BP: (!) 144/78 138/80 139/78 (!) 141/86  Pulse: 75 65 (!) 51 64  Resp: 18 16 16    Temp: 98.5 F (36.9 C) 98.6 F (37 C) 97.6 F (36.4 C) 98.1 F (36.7 C)  TempSrc: Oral Oral Oral Oral  SpO2: 98% 98% 97% 98%  Weight:      Height:       Clear to auscultation.  S1-S2 present. Bowel sound present.  Assessment and plan: T10-T11 spinal stenosis Lower thoracic myelopathy. Undergoing decompressive laminectomy on 5/30. Still on IV Decadron. Management per neurosurgery.   Essential hypertension. Blood pressure stable. Continue current regimen.   Type 2 diabetes mellitus, controlled without any complication without long-term insulin use. Not on medication.  Currently on sliding scale insulin.   Relative B12 deficiency. B12 195. Given subcu injection.  Will continue B12 orally.   HLD. Continuing statin.   Constipation. Continue current regimen.   Obesity Body mass index is 31 kg/m.  Placing the pt at higher risk of poor outcomes.  Author: Lynden Oxford, MD [facility] 10/19/2022 5:02 PM   If 7PM-7AM, please contact night-coverage at [email or web address]

## 2022-10-19 NOTE — TOC Progression Note (Signed)
Transition of Care [facility]) - Progression Note    Patient Details  Name: Mitchell Morgan MRN: [id number] Date of Birth: [birth year]  Transition of Care [facility]) CM/SW Contact  Kermit Balo, RN Phone Number: 10/19/2022, 1:40 PM  Clinical Narrative:    CIR to begin insurance auth for rehab admission. TOC following.        Expected Discharge Plan and Services                                               Social Determinants of Health (SDOH) Interventions SDOH Screenings   Food Insecurity: No Food Insecurity (10/14/2022)  Housing: Low Risk  (10/14/2022)  Transportation Needs: No Transportation Needs (10/14/2022)  Utilities: Not At Risk (10/14/2022)  Tobacco Use: Low Risk  (10/16/2022)    Readmission Risk Interventions     No data to display

## 2022-10-19 NOTE — Progress Notes (Signed)
Occupational Therapy Treatment Patient Details Name: Mitchell Morgan MRN: [id number] DOB: [birth year] Today's Date: 10/19/2022   History of present illness Patient is 77 y.o. male s/p decompressive thoracic laminectomy at T10-11 on 10/15/22 due to stenosis w/ myelopathy. PMH significant for HTN.   OT comments  Pt making progress with functional goals. Pt able to recall 2/3 back precautions; reviewed all back precautions and safety with pt during functional tasks with pt verbalizing understanding and requiring min cues to follow precautions consistently. Initiated ADL A/E education. OT will continue to follow acutely to maximize level of function and safety    Recommendations for follow up therapy are one component of a multi-disciplinary discharge planning process, led by the attending physician.  Recommendations may be updated based on patient status, additional functional criteria and insurance authorization.    Assistance Recommended at Discharge Intermittent Supervision/Assistance  Patient can return home with the following  A little help with walking and/or transfers;A little help with bathing/dressing/bathroom;Assistance with cooking/housework;Assist for transportation;Help with stairs or ramp for entrance   Equipment Recommendations  Other (comment) (ADL A/E kit)    Recommendations for Other Services      Precautions / Restrictions Precautions Precautions: Fall;Back Precaution Booklet Issued: Yes (comment) Precaution Comments: pt able to recall 2/3 back precautions Restrictions Weight Bearing Restrictions: No       Mobility Bed Mobility               General bed mobility comments: pt in recliner upon arrival    Transfers Overall transfer level: Needs assistance Equipment used: Rolling walker (2 wheels) Transfers: Sit to/from Stand Sit to Stand: Min guard     Step pivot transfers: Min guard     General transfer comment: Pt with noted truncal ataxia, dependent on  UE's to power up 2 decreased proprioception in LEs. Min verbal cues for no bending back precautions during sit - stand and stand - sit transitions     Balance Overall balance assessment: Needs assistance, History of Falls Sitting-balance support: Feet supported Sitting balance-Leahy Scale: Fair     Standing balance support: Bilateral upper extremity supported, During functional activity Standing balance-Leahy Scale: Poor Standing balance comment: Relies on UE's to assist with balance                           ADL either performed or assessed with clinical judgement   ADL Overall ADL's : Needs assistance/impaired     Grooming: Wash/dry hands;Wash/dry face;Min guard;Standing       Lower Body Bathing: Minimal assistance;Cueing for safety;Cueing for compensatory techniques;Cueing for back precautions;Sit to/from stand Lower Body Bathing Details (indicate cue type and reason): simulated     Lower Body Dressing: Minimal assistance;Cueing for safety;Cueing for compensatory techniques;Cueing for back precautions;Sit to/from stand   Toilet Transfer: Min guard;Cueing for safety;Regular Toilet;Grab bars;Rolling walker (2 wheels)   Toileting- Architect and Hygiene: Min guard;Cueing for safety;Cueing for compensatory techniques;Cueing for back precautions;Sit to/from stand       Functional mobility during ADLs: Min guard;Rolling walker (2 wheels);Cueing for safety General ADL Comments: Instructed pt in back precautions during functional tasks with pt verbalizing understanding and requiring min cues to follow precautions consistently. Initiated ADL A/E education    Extremity/Trunk Assessment Upper Extremity Assessment Upper Extremity Assessment: Overall WFL for tasks assessed   Lower Extremity Assessment Lower Extremity Assessment: Defer to PT evaluation   Cervical / Trunk Assessment Cervical / Trunk Assessment: Normal;Back Surgery  Vision Baseline  Vision/History: 1 Wears glasses Ability to See in Adequate Light: 0 Adequate Patient Visual Report: No change from baseline     Perception     Praxis      Cognition Arousal/Alertness: Awake/alert Behavior During Therapy: WFL for tasks assessed/performed Overall Cognitive Status: Impaired/Different from baseline Area of Impairment: Memory                     Memory: Decreased recall of precautions                  Exercises      Shoulder Instructions       General Comments      Pertinent Vitals/ Pain       Pain Assessment Pain Assessment: 0-10 Pain Score: 6  Pain Location: back Pain Descriptors / Indicators: Aching, Dull Pain Intervention(s): Monitored during session, Premedicated before session  Home Living                                          Prior Functioning/Environment              Frequency  Min 2X/week        Progress Toward Goals  OT Goals(current goals can now be found in the care plan section)  Progress towards OT goals: Progressing toward goals     Plan Discharge plan remains appropriate    Co-evaluation                 AM-PAC OT "6 Clicks" Daily Activity     Outcome Measure   Help from another person eating meals?: None Help from another person taking care of personal grooming?: A Little Help from another person toileting, which includes using toliet, bedpan, or urinal?: A Little Help from another person bathing (including washing, rinsing, drying)?: A Little Help from another person to put on and taking off regular upper body clothing?: A Little Help from another person to put on and taking off regular lower body clothing?: A Little 6 Click Score: 19    End of Session Equipment Utilized During Treatment: Gait belt;Rolling walker (2 wheels)  OT Visit Diagnosis: Unsteadiness on feet (R26.81);Repeated falls (R29.6);Muscle weakness (generalized) (M62.81);Other abnormalities of gait and  mobility (R26.89);History of falling (Z91.81);Other (comment)   Activity Tolerance Patient tolerated treatment well   Patient Left     Nurse Communication          Time: 4098-1191 OT Time Calculation (min): 26 min  Charges: OT General Charges $OT Visit: 1 Visit OT Treatments $Self Care/Home Management : 8-22 mins $Therapeutic Activity: 8-22 mins    Galen Manila 10/19/2022, 12:15 PM

## 2022-10-19 NOTE — Progress Notes (Signed)
Postop day 4.  Overall doing well.  Minimal back pain.  Still with some pain in both distal lower extremities.  Strength and sensation in both lower extremities significantly improved from preop.  Standing with and walking with aid of a walker.  Feels like he has better motor control in both lower extremities.  Patient is voiding and has had 2 stools.  He also notes improved sensation around his perineal region.  Awake and alert.  Oriented and appropriate.  Vital signs are stable motor examination 5/5 bilateral upper extremities.  4+ to 5/5 in both lower extremities.  Sensory examination with some continued diminished sensation from T11 distally but improved from preop.  Wound clean and dry.  Chest and abdomen benign.  Overall making good progress following thoracic decompressive surgery.  Continue efforts at therapy.  Awaiting transfer to inpatient rehab for further convalescence and improvement.

## 2022-10-19 NOTE — Progress Notes (Signed)
Inpatient Rehab Admissions Coordinator:  Saw pt at bedside. Informed him that beginning insurance authorization. Will continue to follow.   Ragan Duhon Graves Madden, MS, CCC-SLP Admissions Coordinator [phone]  

## 2022-10-19 NOTE — Progress Notes (Signed)
Physical Therapy Treatment Patient Details Name: Mitchell Morgan MRN: [id number] DOB: January 18, 1946 Today's Date: 10/19/2022   History of Present Illness Patient is 77 y.o. male s/p decompressive thoracic laminectomy at T10-11 on 10/15/22 due to stenosis w/ myelopathy. PMH significant for HTN.    PT Comments    Pt progressing towards physical therapy goals. Was able to perform transfers and ambulation with up to heavy min assist. Increased assist required towards end of session as pt fatigued, and was approaching mod assist. Continues to appear ataxic. Continue to recommend post-acute rehab >3 hours/day to maximize functional independence, safety, and return to PLOF.     Recommendations for follow up therapy are one component of a multi-disciplinary discharge planning process, led by the attending physician.  Recommendations may be updated based on patient status, additional functional criteria and insurance authorization.  Follow Up Recommendations       Assistance Recommended at Discharge Frequent or constant Supervision/Assistance  Patient can return home with the following Assistance with cooking/housework;Assist for transportation;Help with stairs or ramp for entrance;A lot of help with walking and/or transfers;A lot of help with bathing/dressing/bathroom   Equipment Recommendations  Rolling walker (2 wheels)    Recommendations for Other Services       Precautions / Restrictions Precautions Precautions: Fall;Back Precaution Booklet Issued: Yes (comment) Precaution Comments: Pt unable to recall back precautions. Restrictions Weight Bearing Restrictions: No     Mobility  Bed Mobility Overal bed mobility: Needs Assistance Bed Mobility: Rolling, Sit to Sidelying, Sidelying to Sit Rolling: Min guard Sidelying to sit: Min guard     Sit to sidelying: Min assist General bed mobility comments: VC's throughout for optimal log roll technique. Pt was able to complete with min guard to  min assist and use of rails. HOB flat to simulate home environment.    Transfers Overall transfer level: Needs assistance Equipment used: Rolling walker (2 wheels) Transfers: Sit to/from Stand Sit to Stand: Min guard   Step pivot transfers: Min guard       General transfer comment: Pt with noted truncal ataxia, dependent on UE's to power up 2 decreased proprioception in LEs.    Ambulation/Gait Ambulation/Gait assistance: Min assist Gait Distance (Feet): 75 Feet Assistive device: Rolling walker (2 wheels) Gait Pattern/deviations: Step-through pattern, Decreased stride length, Narrow base of support, Ataxic, Leaning posteriorly, Trunk flexed (decreased coordination of LEs with gait) Gait velocity: Decreased Gait velocity interpretation: <1.8 ft/sec, indicate of risk for recurrent falls   General Gait Details: Heavy lean onto walker and posterior LOB with attempt to improve posture to neutral. Assist provided throughout for balance support and safety. Heavy min assist approaching mod assist by end of gait training.   Stairs             Wheelchair Mobility    Modified Rankin (Stroke Patients Only)       Balance Overall balance assessment: Needs assistance, History of Falls Sitting-balance support: Feet supported Sitting balance-Leahy Scale: Fair     Standing balance support: Bilateral upper extremity supported Standing balance-Leahy Scale: Poor Standing balance comment: Relies on UE's to assist with balance                            Cognition Arousal/Alertness: Awake/alert Behavior During Therapy: WFL for tasks assessed/performed Overall Cognitive Status: Impaired/Different from baseline Area of Impairment: Memory  Memory: Decreased recall of precautions         General Comments: Pt does not appear to remember any back precautions. He appeared surprised when discussing precautions and log roll technique.         Exercises      General Comments        Pertinent Vitals/Pain Pain Assessment Pain Assessment: Faces Faces Pain Scale: Hurts little more Pain Location: burning in bilat thighs Pain Descriptors / Indicators: Burning Pain Intervention(s): Limited activity within patient's tolerance, Monitored during session, Repositioned    Home Living                          Prior Function            PT Goals (current goals can now be found in the care plan section) Acute Rehab PT Goals Patient Stated Goal: to regain maximum independence prior to return home PT Goal Formulation: With patient Time For Goal Achievement: 10/23/22 Potential to Achieve Goals: Good Progress towards PT goals: Progressing toward goals    Frequency    Min 5X/week      PT Plan Current plan remains appropriate    Co-evaluation              AM-PAC PT "6 Clicks" Mobility   Outcome Measure  Help needed turning from your back to your side while in a flat bed without using bedrails?: A Little Help needed moving from lying on your back to sitting on the side of a flat bed without using bedrails?: A Little Help needed moving to and from a bed to a chair (including a wheelchair)?: A Little Help needed standing up from a chair using your arms (e.g., wheelchair or bedside chair)?: A Little Help needed to walk in hospital room?: A Little Help needed climbing 3-5 steps with a railing? : Total 6 Click Score: 16    End of Session Equipment Utilized During Treatment: Gait belt Activity Tolerance: Patient tolerated treatment well Patient left: in chair;with call bell/phone within reach Nurse Communication: Mobility status PT Visit Diagnosis: Unsteadiness on feet (R26.81);History of falling (Z91.81) Pain - Right/Left:  (back) Pain - part of body:  (back)     Time: 1610-9604 PT Time Calculation (min) (ACUTE ONLY): 34 min  Charges:  $Gait Training: 23-37 mins                     Conni Slipper,  PT, DPT Acute Rehabilitation Services Secure Chat Preferred Office: [phone]    Marylynn Pearson 10/19/2022, 11:03 AM

## 2022-10-20 DIAGNOSIS — R29898 Other symptoms and signs involving the musculoskeletal system: Secondary | ICD-10-CM | POA: Diagnosis not present

## 2022-10-20 LAB — GLUCOSE, CAPILLARY
Glucose-Capillary: 154 mg/dL — ABNORMAL HIGH (ref 70–99)
Glucose-Capillary: 137 mg/dL — ABNORMAL HIGH (ref 70–99)
Glucose-Capillary: 151 mg/dL — ABNORMAL HIGH (ref 70–99)
Glucose-Capillary: 156 mg/dL — ABNORMAL HIGH (ref 70–99)

## 2022-10-20 MED ORDER — HYDROCORTISONE ACETATE 25 MG RE SUPP
25.0000 mg | Freq: Two times a day (BID) | RECTAL | Status: DC
  Administered 2022-10-20 – 2022-10-22 (×4): 25 mg via RECTAL
  Filled 2022-10-20 (×5): qty 1

## 2022-10-20 MED ORDER — DEXAMETHASONE 4 MG PO TABS
4.0000 mg | ORAL_TABLET | Freq: Two times a day (BID) | ORAL | Status: DC
  Administered 2022-10-22: 4 mg via ORAL
  Filled 2022-10-20 (×2): qty 1

## 2022-10-20 MED ORDER — DEXAMETHASONE 4 MG PO TABS: 4.0000 mg | ORAL_TABLET | Freq: Every day | ORAL | Status: DC

## 2022-10-20 MED ORDER — DEXAMETHASONE 4 MG PO TABS
4.0000 mg | ORAL_TABLET | Freq: Three times a day (TID) | ORAL | Status: AC
  Administered 2022-10-21 (×3): 4 mg via ORAL
  Filled 2022-10-20 (×3): qty 1

## 2022-10-20 NOTE — Progress Notes (Signed)
Inpatient Rehabilitation Admissions Coordinator   I await insurance approval for possible CIR admit.  Ottie Glazier, RN, MSN Rehab Admissions Coordinator [phone] 10/20/2022 12:11 PM

## 2022-10-20 NOTE — Plan of Care (Signed)
Patient Mitchell Morgan, VSS throughout shift, HR 50s, pt remains asymptomatic.  All meds given on time as ordered.  Pt c/o pain relieved by PRN norco.  Ambien given for sleep.  Diminished lungs, IS encouraged.  Pt voided in urinal.  POC maintained, will continue to monitor.  Problem: Education: Goal: Knowledge of General Education information will improve Description: Including pain rating scale, medication(s)/side effects and non-pharmacologic comfort measures Outcome: Progressing   Problem: Health Behavior/Discharge Planning: Goal: Ability to manage health-related needs will improve Outcome: Progressing   Problem: Clinical Measurements: Goal: Ability to maintain clinical measurements within normal limits will improve Outcome: Progressing Goal: Will remain free from infection Outcome: Progressing Goal: Diagnostic test results will improve Outcome: Progressing Goal: Respiratory complications will improve Outcome: Progressing Goal: Cardiovascular complication will be avoided Outcome: Progressing   Problem: Activity: Goal: Risk for activity intolerance will decrease Outcome: Progressing   Problem: Nutrition: Goal: Adequate nutrition will be maintained Outcome: Progressing   Problem: Coping: Goal: Level of anxiety will decrease Outcome: Progressing   Problem: Elimination: Goal: Will not experience complications related to bowel motility Outcome: Progressing Goal: Will not experience complications related to urinary retention Outcome: Progressing   Problem: Pain Managment: Goal: General experience of comfort will improve Outcome: Progressing   Problem: Safety: Goal: Ability to remain free from injury will improve Outcome: Progressing   Problem: Skin Integrity: Goal: Risk for impaired skin integrity will decrease Outcome: Progressing   Problem: Education: Goal: Ability to verbalize activity precautions or restrictions will improve Outcome: Progressing Goal: Knowledge of the  prescribed therapeutic regimen will improve Outcome: Progressing Goal: Understanding of discharge needs will improve Outcome: Progressing   Problem: Activity: Goal: Ability to avoid complications of mobility impairment will improve Outcome: Progressing Goal: Ability to tolerate increased activity will improve Outcome: Progressing Goal: Will remain free from falls Outcome: Progressing   Problem: Bowel/Gastric: Goal: Gastrointestinal status for postoperative course will improve Outcome: Progressing   Problem: Clinical Measurements: Goal: Ability to maintain clinical measurements within normal limits will improve Outcome: Progressing Goal: Postoperative complications will be avoided or minimized Outcome: Progressing Goal: Diagnostic test results will improve Outcome: Progressing   Problem: Pain Management: Goal: Pain level will decrease Outcome: Progressing   Problem: Skin Integrity: Goal: Will show signs of wound healing Outcome: Progressing   Problem: Health Behavior/Discharge Planning: Goal: Identification of resources available to assist in meeting health care needs will improve Outcome: Progressing   Problem: Bladder/Genitourinary: Goal: Urinary functional status for postoperative course will improve Outcome: Progressing

## 2022-10-20 NOTE — Progress Notes (Signed)
[facility] Progress Note Patient: Mitchell Morgan:[id number] DOB: [birth year] DOA: 10/13/2022  DOS: the patient was seen and examined on 10/20/2022  Brief hospital course: PMH of HTN and HLD as well as obesity presented to the hospital with complaints of back pain as well as bilateral numbness.  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  Neurosurgery was consulted and recommended decompressive laminectomy. Assessment and Plan: T10-T11 spinal stenosis Lower thoracic myelopathy. Underwent decompressive laminectomy on 5/30. Was treated with IV Decadron.  Per my discussion with neurosurgery we will now taper off. Pain management per neurosurgery.  DVT prophylaxis per neurosurgery.  Essential hypertension. Blood pressure stable. Continue current regimen.  Type 2 diabetes mellitus, controlled without any complication without long-term insulin use. Not on medication.  Relative B12 deficiency. B12 195. Received 1 dose of subcu injection.  Will initiate oral therapy.  HLD. Continuing statin.  Constipation. Continue current regimen.  Hemorrhoids. Anusol suppository.  Obesity Body mass index is 31 kg/m.  Placing the pt at higher risk of poor outcomes.   Subjective: No acute.  Nausea and vomiting no fever no chills.  No bleeding.  He thinks that his ankles are swollen.  Physical Exam: Clear to auscultation. No edema in the lower extremity.  Data Reviewed: I have Reviewed nursing notes, Vitals, and Lab results. Discussed with neurosurgery. Reviewed CBG.  Disposition: Status is: Inpatient Remains inpatient appropriate because: Medically stable.  Awaiting CIR placement.  SCD's Start: 10/15/22 2251 Place and maintain sequential compression device Start: 10/15/22 0853   Family Communication: No one at bedside Level of care: Med-Surg   Vitals:   10/20/22 0425 10/20/22 0722 10/20/22 1607 10/20/22 2003  BP: (!) 151/81 (!) 154/79 (!)  153/90 (!) 149/73  Pulse: (!) 50 (!) 47 71 (!) 56  Resp: 16   15  Temp: (!) 97.4 F (36.3 C) (!) 97.3 F (36.3 C) 98.1 F (36.7 C) 97.6 F (36.4 C)  TempSrc: Oral Oral Oral Oral  SpO2: 94% 97% 98% 98%  Weight:      Height:         Author: Lynden Oxford, MD 10/20/2022 8:07 PM  Please look on [email or web address] to find out who is on call.

## 2022-10-20 NOTE — Progress Notes (Signed)
Physical Therapy Treatment Patient Details Name: Mitchell Morgan MRN: [id number] DOB: 1945-06-27 Today's Date: 10/20/2022   History of Present Illness Patient is 77 y.o. male s/p decompressive thoracic laminectomy at T10-11 on 10/15/22 due to stenosis w/ myelopathy. PMH significant for HTN.    PT Comments    Patient progressing with ambulation distance and with standing balance/proprioceptive awareness.  Patient eager and wanting to push himself to walk back, but encouraged rest to allow for improved quality with ambulation back to room.  Still noting increased flexed posture and difficulty with foot placement with fatigue.  Patient able to stand unsupported with min A and cues/facilitation for keeping hips over knees over ankles in standing as tends to flex hips behind COG.  Patient remains appropriate for intensive inpatient rehab prior to d/c home with family support.    Recommendations for follow up therapy are one component of a multi-disciplinary discharge planning process, led by the attending physician.  Recommendations may be updated based on patient status, additional functional criteria and insurance authorization.  Follow Up Recommendations       Assistance Recommended at Discharge Frequent or constant Supervision/Assistance  Patient can return home with the following Assistance with cooking/housework;Assist for transportation;Help with stairs or ramp for entrance;A lot of help with walking and/or transfers;A lot of help with bathing/dressing/bathroom   Equipment Recommendations  Rolling walker (2 wheels)    Recommendations for Other Services       Precautions / Restrictions Precautions Precautions: Fall;Back Precaution Comments: recalled 1/3 precautions; re-educated     Mobility  Bed Mobility Overal bed mobility: Needs Assistance Bed Mobility: Sit to Sidelying         Sit to sidelying: Mod assist General bed mobility comments: cues for technique, assist for legs  onto bed; cues for technique for scooting up in bed and assist for foot placement on bed and to stabilize    Transfers Overall transfer level: Needs assistance Equipment used: Rolling walker (2 wheels) Transfers: Sit to/from Stand Sit to Stand: Min assist           General transfer comment: assist for balance, cues for hand placement especially up from wheelchair in hallway where he sat for seated rest.    Ambulation/Gait Ambulation/Gait assistance: Min assist Gait Distance (Feet): 80 Feet (x 2) Assistive device: Rolling walker (2 wheels) Gait Pattern/deviations: Step-through pattern, Decreased stride length, Narrow base of support, Ataxic, Leaning posteriorly, Trunk flexed       General Gait Details: cues for forward gaze and occasional glancing at feet, note at times with more uncontrolled steps when not looking at feet so needs slightly increased support; seated rest in hallway prior to return to allow for improved gait quality and less fatigue   Stairs             Wheelchair Mobility    Modified Rankin (Stroke Patients Only)       Balance Overall balance assessment: Needs assistance Sitting-balance support: Feet supported Sitting balance-Leahy Scale: Good Sitting balance - Comments: on EOB bringing foot up to cross to remove shoes with minguard to S     Standing balance-Leahy Scale: Poor Standing balance comment: working on balance at counter at nursing station with 30 sec bouts unsupported with UE's needing cues for keeping COG over BOS as hips moving posterior to BOS at times and facilitation provided for hip extensor activation  Cognition Arousal/Alertness: Awake/alert Behavior During Therapy: WFL for tasks assessed/performed Overall Cognitive Status: Impaired/Different from baseline Area of Impairment: Memory                               General Comments: continued limited recall of back  precautions        Exercises      General Comments General comments (skin integrity, edema, etc.): wife and son present in the room, discussed timing of sessions at rehab and importance of rest breaks.      Pertinent Vitals/Pain Pain Assessment Pain Score: 5  Pain Location: back Pain Descriptors / Indicators: Sore Pain Intervention(s): Monitored during session, Repositioned    Home Living                          Prior Function            PT Goals (current goals can now be found in the care plan section) Progress towards PT goals: Progressing toward goals    Frequency    Min 5X/week      PT Plan Current plan remains appropriate    Co-evaluation              AM-PAC PT "6 Clicks" Mobility   Outcome Measure  Help needed turning from your back to your side while in a flat bed without using bedrails?: A Little Help needed moving from lying on your back to sitting on the side of a flat bed without using bedrails?: A Little Help needed moving to and from a bed to a chair (including a wheelchair)?: A Little Help needed standing up from a chair using your arms (e.g., wheelchair or bedside chair)?: A Little Help needed to walk in hospital room?: A Little Help needed climbing 3-5 steps with a railing? : Total 6 Click Score: 16    End of Session Equipment Utilized During Treatment: Gait belt Activity Tolerance: Patient tolerated treatment well Patient left: in bed;with call bell/phone within reach;with family/visitor present   PT Visit Diagnosis: Unsteadiness on feet (R26.81);History of falling (Z91.81)     Time: 1610-9604 PT Time Calculation (min) (ACUTE ONLY): 28 min  Charges:  $Gait Training: 8-22 mins $Neuromuscular Re-education: 8-22 mins                     Sheran Lawless, PT Acute Rehabilitation Services Office:[phone] 10/20/2022    Elray Mcgregor 10/20/2022, 6:01 PM

## 2022-10-20 NOTE — Progress Notes (Signed)
No new issues or problems.  Patient with minimal back pain.  Still with some lower extremity pain and numbness but improved from yesterday and markedly improved from preop.  Afebrile.  Vital signs are stable.  Neurologic exam is stable.  Patient progressing well.  Ready for inpatient rehabilitation.  Continue PT OT.

## 2022-10-21 DIAGNOSIS — R29898 Other symptoms and signs involving the musculoskeletal system: Secondary | ICD-10-CM | POA: Diagnosis not present

## 2022-10-21 LAB — GLUCOSE, CAPILLARY
Glucose-Capillary: 110 mg/dL — ABNORMAL HIGH (ref 70–99)
Glucose-Capillary: 189 mg/dL — ABNORMAL HIGH (ref 70–99)
Glucose-Capillary: 147 mg/dL — ABNORMAL HIGH (ref 70–99)
Glucose-Capillary: 155 mg/dL — ABNORMAL HIGH (ref 70–99)

## 2022-10-21 LAB — CBC
HCT: 43.5 % (ref 39.0–52.0)
Hemoglobin: 15.3 g/dL (ref 13.0–17.0)
MCH: 31.3 pg (ref 26.0–34.0)
MCHC: 35.2 g/dL (ref 30.0–36.0)
MCV: 89 fL (ref 80.0–100.0)
Platelets: 249 10*3/uL (ref 150–400)
RBC: 4.89 MIL/uL (ref 4.22–5.81)
RDW: 11.9 % (ref 11.5–15.5)
WBC: 13.7 10*3/uL — ABNORMAL HIGH (ref 4.0–10.5)
nRBC: 0 % (ref 0.0–0.2)

## 2022-10-21 LAB — BASIC METABOLIC PANEL
Anion gap: 9 (ref 5–15)
BUN: 22 mg/dL (ref 8–23)
CO2: 26 mmol/L (ref 22–32)
Calcium: 8.9 mg/dL (ref 8.9–10.3)
Chloride: 98 mmol/L (ref 98–111)
Creatinine, Ser: 0.78 mg/dL (ref 0.61–1.24)
GFR, Estimated: >60 mL/min (ref >60)
Glucose, Bld: 112 mg/dL — ABNORMAL HIGH (ref 70–99)
Potassium: 4.2 mmol/L (ref 3.5–5.1)
Sodium: 133 mmol/L — ABNORMAL LOW (ref 135–145)

## 2022-10-21 MED ORDER — ACETAMINOPHEN 325 MG PO TABS
650.0000 mg | ORAL_TABLET | Freq: Four times a day (QID) | ORAL | Status: DC | PRN
  Filled 2022-10-21: qty 2

## 2022-10-21 NOTE — Progress Notes (Signed)
Physical Therapy Treatment Patient Details Name: Mitchell Morgan MRN: [id number] DOB: 07-Apr-1946 Today's Date: 10/21/2022   History of Present Illness Patient is 77 y.o. male s/p decompressive thoracic laminectomy at T10-11 on 10/15/22 due to stenosis w/ myelopathy. PMH significant for HTN.    PT Comments    Pt progressing towards goals per plan of care. Received sitting upright in chair motivated for therapy. Able to progress ambulation distance past 100 ft; required two seated rest breaks due to fatigue. Pt experienced one minor knee buckle with fair recovery on RW. Throughout ambulation pt cued on step through pattern, heel strike, and upright stance but still presents with ataxtic step pattern. Able to recall spinal precautions and requested education on proper sit to stand technique. End of session with patient supine; reeducated on bed mobility and scooting towards HOB while in sidelying position. Recommending post acute rehab > 3 hrs a day in order to facilitate improvements in functional mobility and coordination.     Recommendations for follow up therapy are one component of a multi-disciplinary discharge planning process, led by the attending physician.  Recommendations may be updated based on patient status, additional functional criteria and insurance authorization.  Follow Up Recommendations       Assistance Recommended at Discharge Frequent or constant Supervision/Assistance  Patient can return home with the following A lot of help with walking and/or transfers;A lot of help with bathing/dressing/bathroom;Assist for transportation;Help with stairs or ramp for entrance   Equipment Recommendations  Rolling walker (2 wheels)    Recommendations for Other Services       Precautions / Restrictions Precautions Precautions: Fall;Back Precaution Booklet Issued: Yes (comment) Precaution Comments: Reviewed Spinal precautions, Pt asked about bending versus lean, educated on precautions  with forward lean/bend. Restrictions Weight Bearing Restrictions: No     Mobility  Bed Mobility Overal bed mobility: Needs Assistance Bed Mobility: Sit to Sidelying, Rolling Rolling: Min guard       Sit to sidelying: Min assist General bed mobility comments: cued for sidelying technique and spinal precautions; min assist for legs onto bed; verbal cues to scoot in sidelying towards HOB    Transfers Overall transfer level: Needs assistance Equipment used: Rolling walker (2 wheels) Transfers: Sit to/from Stand Sit to Stand: Min guard           General transfer comment: Prior to transfer, educated on proper technique for power up. Pt progressed transfer to min guard, uses two hands and RW for assistance; cued for hand placement.    Ambulation/Gait Ambulation/Gait assistance: Min guard; Editor, commissioning (Feet): 150 Feet Assistive device: Rolling walker (2 wheels) Gait Pattern/deviations: Step-through pattern, Decreased step length - right, Decreased step length - left, Decreased stride length, Ataxic, Decreased dorsiflexion - right, Decreased dorsiflexion - left Gait velocity: decreased Gait velocity interpretation: <1.31 ft/sec, indicative of household ambulator   General Gait Details: Pt required cues for upright stance and heel strike for both feet; able to progress to step-through pattern. Presented with ataxic stepping pattern. Pt experienced one instance of minor right knee buckling due to pain in knee, able to recover with minA and RW support. Provided 2 seated rest braks in hallway due to fatigue.   Stairs             Wheelchair Mobility    Modified Rankin (Stroke Patients Only)       Balance Overall balance assessment: Needs assistance Sitting-balance support: Feet supported Sitting balance-Leahy Scale: Good Sitting balance - Comments: Pt able to  sit EOB without UE support and discuss precautions.   Standing balance support: Bilateral upper  extremity supported, During functional activity Standing balance-Leahy Scale: Poor Standing balance comment: Requires RW for balance throughout ambulation                            Cognition Arousal/Alertness: Awake/alert Behavior During Therapy: WFL for tasks assessed/performed Overall Cognitive Status: Within Functional Limits for tasks assessed                                 General Comments: Able to recall back precations from previous session. Also able to recall sit to stand transfer technique towards end of therapy.        Exercises      General Comments        Pertinent Vitals/Pain Pain Assessment Pain Assessment: PAINAD Faces Pain Scale: Hurts little more Pain Location: Bilateral knees Pain Descriptors / Indicators: Sore (Reported Sting in knees) Pain Intervention(s): Limited activity within patient's tolerance, Monitored during session    Home Living                          Prior Function            PT Goals (current goals can now be found in the care plan section) Acute Rehab PT Goals Patient Stated Goal: to regain maximum independence prior to return home PT Goal Formulation: With patient Time For Goal Achievement: 10/23/22 Potential to Achieve Goals: Good Progress towards PT goals: Progressing toward goals    Frequency    Min 5X/week      PT Plan Current plan remains appropriate    Co-evaluation              AM-PAC PT "6 Clicks" Mobility   Outcome Measure  Help needed turning from your back to your side while in a flat bed without using bedrails?: A Little Help needed moving from lying on your back to sitting on the side of a flat bed without using bedrails?: A Little Help needed moving to and from a bed to a chair (including a wheelchair)?: A Little Help needed standing up from a chair using your arms (e.g., wheelchair or bedside chair)?: A Little Help needed to walk in hospital room?: A  Little Help needed climbing 3-5 steps with a railing? : A Lot 6 Click Score: 17    End of Session Equipment Utilized During Treatment: Gait belt Activity Tolerance: Patient tolerated treatment well Patient left: in bed;with call bell/phone within reach Nurse Communication: Mobility status PT Visit Diagnosis: Unsteadiness on feet (R26.81);History of falling (Z91.81) Pain - Right/Left:  (Bilateral) Pain - part of body: Knee     Time: 0221-0240 PT Time Calculation (min) (ACUTE ONLY): 19 min  Charges:  $Gait Training: 8-22 mins                     Christene Lye, SPT Acute Rehabilitation Services [phone] Secure chat preferred     Christene Lye 10/21/2022, 3:21 PM

## 2022-10-21 NOTE — Progress Notes (Signed)
Rajdeep Abbs Bourcier  ZOX:[id number] DOB: 1945/07/20 DOA: 10/13/2022 PCP: Andreas Blower., MD    Brief Narrative:  77 year old with a history of obesity, HTN, and HLD who presented to the hospital with complaints of back pain and bilateral lower extremity numbness workup was consistent with spinal stenosis at T10/T11 with lower thoracic myelopathy.  Neurosurgery was consulted and recommended decompressive laminectomy.  Consultants:  Neurosurgery  Goals of Care:  Code Status: Full Code   DVT prophylaxis: SCDs  Interim Hx: Afebrile.  Vital signs stable.  CBG controlled.  No new complaints today.  Working with therapy at the time my evaluation.  Alert pleasant and conversant.  Highly motivated to begin aggressive therapy in CIR.  Assessment & Plan:  T10/T11 spinal stenosis with lower thoracic myelopathy Status post decompressive laminectomy 5/30 -treated with IV Decadron which is now being tapered to off in oral form   HTN Blood pressure well-controlled  DM 2 Does not require medication in outpatient setting -CBG reasonably controlled  B12 deficiency B12 noted to be 195 -dosed with subcutaneous supplementation then transitioned to oral therapy -will need recheck in 6 to 8 weeks as outpatient  HLD Continue statin  Obesity - Body mass index is 31 kg/m.   Family Communication:  Disposition: Medically stable and awaiting CIR placement   Objective: Blood pressure (!) 147/78, pulse (!) 47, temperature 98.1 F (36.7 C), temperature source Oral, resp. rate 15, height 5\' 10"  (1.778 m), weight 98 kg, SpO2 97 %. No intake or output data in the 24 hours ending 10/21/22 1021 Filed Weights   10/13/22 2032 10/15/22 1511  Weight: 97.1 kg 98 kg    Examination: General: No acute respiratory distress Lungs: Clear to auscultation bilaterally  Cardiovascular: Regular rate and rhythm without murmur  Abdomen: Nontender, nondistended, soft, bowel sounds positive Extremities: No significant  edema bilateral lower extremities  CBC: Recent Labs  Lab 10/15/22 1037 10/16/22 1106 10/19/22 0457  WBC 14.9* 16.5* 9.0  NEUTROABS 13.3*  --   --   HGB 14.1 14.4 12.8*  HCT 40.5 41.7 36.3*  MCV 90.4 90.3 92.6  PLT 270 275 193   Basic Metabolic Panel: Recent Labs  Lab 10/15/22 1037 10/16/22 1106 10/19/22 0457  NA 137 128* 131*  K 3.8 4.0 4.7  CL 100 96* 98  CO2 22 23 24   GLUCOSE 143* 172* 162*  BUN 24* 22 22  CREATININE 0.83 0.90 0.76  CALCIUM 9.4 8.7* 8.3*   GFR: Estimated Creatinine Clearance: 90.8 mL/min (by C-G formula based on SCr of 0.76 mg/dL).   Scheduled Meds:  amLODipine  10 mg Oral Daily   aspirin EC  81 mg Oral Daily   atenolol  50 mg Oral Daily   atorvastatin  20 mg Oral Daily   cholecalciferol  1,000 Units Oral Daily   vitamin B-12  1,000 mcg Oral Daily   dexamethasone  4 mg Oral Q8H   Followed by   [START ON 10/22/2022] dexamethasone  4 mg Oral Q12H   Followed by   Melene Muller ON 10/23/2022] dexamethasone  4 mg Oral Daily   diclofenac Sodium  2 g Topical BID   hydrocortisone  25 mg Rectal BID   polyethylene glycol  17 g Oral Daily   senna-docusate  2 tablet Oral BID   sodium chloride flush  3 mL Intravenous Q12H   sorbitol  30 mL Oral Daily    LOS: 7 days   Lonia Blood, MD [facility] Office  [phone] Pager -  Text Page per Loretha Stapler  If 7PM-7AM, please contact night-coverage per Amion 10/21/2022, 10:21 AM

## 2022-10-21 NOTE — Progress Notes (Signed)
No new issues or problems.  Continues to slowly improve.  Continue efforts at therapy and awaiting final determination with regard to possible rehab medicine transfer

## 2022-10-21 NOTE — Progress Notes (Signed)
Inpatient Rehabilitation Admissions Coordinator   [facility] for [facility] determination. I await their return call.  Ottie Glazier, RN, MSN Rehab Admissions Coordinator [phone] 10/21/2022 2:12 PM

## 2022-10-21 NOTE — Progress Notes (Addendum)
Occupational Therapy Treatment Patient Details Name: Mitchell Morgan MRN: [id number] DOB: 07-06-1945 Today's Date: 10/21/2022   History of present illness Patient is 77 y.o. male s/p decompressive thoracic laminectomy at T10-11 on 10/15/22 due to stenosis w/ myelopathy. PMH significant for HTN.   OT comments  Pt making good progress with functional goals. Pt motivated and eager to work with therapy. Pt participated in functional mobility using RW to walk to bathroom min guard A with min verbal cues for safety, min guard A with clothing mgt at toilet, min guard A to transfer to toilet, min A - min guard A stepping in and out of shower with min verbal cues to use grab bar. Pt stood at sink for oral care and hygiene min guard A. Pt able to recall 2/3 back precautions but required min verbal cues for adhering to precautions during ADL mobility activity, LB simulated ADLs. Pt re educated on back precautions.    Recommendations for follow up therapy are one component of a multi-disciplinary discharge planning process, led by the attending physician.  Recommendations may be updated based on patient status, additional functional criteria and insurance authorization.    Assistance Recommended at Discharge Intermittent Supervision/Assistance  Patient can return home with the following  A little help with walking and/or transfers;A little help with bathing/dressing/bathroom;Assistance with cooking/housework;Assist for transportation;Help with stairs or ramp for entrance   Equipment Recommendations       Recommendations for Other Services      Precautions / Restrictions Precautions Precautions: Fall;Back Precaution Comments: recalled 2/3 precautions; reviewed/re-educated Restrictions Weight Bearing Restrictions: No       Mobility Bed Mobility Overal bed mobility: Needs Assistance Bed Mobility: Rolling, Sidelying to Sit Rolling: Min guard Sidelying to sit: Min guard             Transfers Overall transfer level: Needs assistance Equipment used: Rolling walker (2 wheels) Transfers: Sit to/from Stand Sit to Stand: Min assist     Step pivot transfers: Min guard, Min assist     General transfer comment: assist for balance, cues for hand placement at toilet wiht min guard A, min A in and out of shower, cues to use grab bar     Balance Overall balance assessment: Needs assistance         Standing balance support: Bilateral upper extremity supported, During functional activity Standing balance-Leahy Scale: Poor                             ADL either performed or assessed with clinical judgement   ADL Overall ADL's : Needs assistance/impaired     Grooming: Wash/dry hands;Wash/dry face;Oral care;Standing   Upper Body Bathing: Supervision/ safety;Sitting Upper Body Bathing Details (indicate cue type and reason): simulated Lower Body Bathing: Minimal assistance;Cueing for safety;Cueing for compensatory techniques;Cueing for back precautions;Sit to/from stand Lower Body Bathing Details (indicate cue type and reason): simulated     Lower Body Dressing: Minimal assistance;Cueing for safety;Cueing for compensatory techniques;Cueing for back precautions;Sit to/from stand   Toilet Transfer: Min guard;Cueing for safety;Regular Toilet;Grab bars;Rolling walker (2 wheels)   Toileting- Architect and Hygiene: Min guard;Cueing for safety;Cueing for compensatory techniques;Cueing for back precautions;Sit to/from stand   Tub/ Shower Transfer: Minimal assistance;Grab bars;Cueing for safety   Functional mobility during ADLs: Minimal assistance;Min guard;Rolling walker (2 wheels);Cueing for safety General ADL Comments: Re educated pt on back precautions during functional tasks with pt verbalizing understanding and requiring min cues to follow  precautions consistently. Initiated ADL A/E education    Extremity/Trunk Assessment Upper Extremity  Assessment Upper Extremity Assessment: Overall WFL for tasks assessed   Lower Extremity Assessment Lower Extremity Assessment: Defer to PT evaluation   Cervical / Trunk Assessment Cervical / Trunk Assessment: Back Surgery    Vision Baseline Vision/History: 1 Wears glasses Ability to See in Adequate Light: 0 Adequate Patient Visual Report: No change from baseline     Perception     Praxis      Cognition Arousal/Alertness: Awake/alert Behavior During Therapy: WFL for tasks assessed/performed Overall Cognitive Status: Impaired/Different from baseline Area of Impairment: Memory                     Memory: Decreased recall of precautions         General Comments: continued limited recall of back precautions        Exercises      Shoulder Instructions       General Comments      Pertinent Vitals/ Pain       Pain Assessment Pain Assessment: 0-10 Pain Score: 3  Pain Location: back Pain Descriptors / Indicators: Sore Pain Intervention(s): Monitored during session, Repositioned  Home Living                                          Prior Functioning/Environment              Frequency  Min 2X/week        Progress Toward Goals  OT Goals(current goals can now be found in the care plan section)  Progress towards OT goals: Progressing toward goals     Plan Discharge plan remains appropriate    Co-evaluation                 AM-PAC OT "6 Clicks" Daily Activity     Outcome Measure   Help from another person eating meals?: None Help from another person taking care of personal grooming?: A Little Help from another person toileting, which includes using toliet, bedpan, or urinal?: A Little Help from another person bathing (including washing, rinsing, drying)?: A Little Help from another person to put on and taking off regular upper body clothing?: A Little Help from another person to put on and taking off regular lower  body clothing?: A Little 6 Click Score: 19    End of Session Equipment Utilized During Treatment: Gait belt;Rolling walker (2 wheels)  OT Visit Diagnosis: Unsteadiness on feet (R26.81);Repeated falls (R29.6);Muscle weakness (generalized) (M62.81);Other abnormalities of gait and mobility (R26.89);History of falling (Z91.81);Other (comment)   Activity Tolerance Patient tolerated treatment well   Patient Left in chair;with call bell/phone within reach;with nursing/sitter in room   Nurse Communication          Time: 1206-1239 OT Time Calculation (min): 33 min  Charges: OT General Charges $OT Visit: 1 Visit OT Treatments $Therapeutic Activity: 8-22 mins    Galen Manila 10/21/2022, 2:13 PM

## 2022-10-22 ENCOUNTER — Other Ambulatory Visit (HOSPITAL_COMMUNITY): Payer: Self-pay

## 2022-10-22 DIAGNOSIS — R29898 Other symptoms and signs involving the musculoskeletal system: Secondary | ICD-10-CM | POA: Diagnosis not present

## 2022-10-22 LAB — GLUCOSE, CAPILLARY
Glucose-Capillary: 173 mg/dL — ABNORMAL HIGH (ref 70–99)
Glucose-Capillary: 121 mg/dL — ABNORMAL HIGH (ref 70–99)

## 2022-10-22 MED ORDER — HYDROCODONE-ACETAMINOPHEN 10-325 MG PO TABS
1.0000 | ORAL_TABLET | ORAL | 0 refills | Status: DC | PRN
  Filled 2022-10-22: qty 12, 7d supply, fill #0

## 2022-10-22 MED ORDER — CYANOCOBALAMIN 1000 MCG PO TABS: 1000.0000 ug | ORAL_TABLET | Freq: Every day | ORAL | Status: DC

## 2022-10-22 MED ORDER — DEXAMETHASONE 4 MG PO TABS
ORAL_TABLET | ORAL | 0 refills | Status: DC
  Filled 2022-10-22: qty 6, 4d supply, fill #0

## 2022-10-22 MED ORDER — SENNOSIDES-DOCUSATE SODIUM 8.6-50 MG PO TABS: 2.0000 | ORAL_TABLET | Freq: Two times a day (BID) | ORAL | Status: DC

## 2022-10-22 MED ORDER — CYCLOBENZAPRINE HCL 10 MG PO TABS
10.0000 mg | ORAL_TABLET | Freq: Three times a day (TID) | ORAL | 0 refills | Status: DC | PRN
  Filled 2022-10-22: qty 30, 10d supply, fill #0

## 2022-10-22 MED ORDER — CALCIUM CARBONATE ANTACID 500 MG PO CHEW: 1.0000 | CHEWABLE_TABLET | Freq: Four times a day (QID) | ORAL | Status: DC | PRN

## 2022-10-22 MED ORDER — POLYETHYLENE GLYCOL 3350 17 GM/SCOOP PO POWD
17.0000 g | Freq: Every day | ORAL | 0 refills | Status: DC
  Filled 2022-10-22: qty 238, 14d supply, fill #0

## 2022-10-22 NOTE — Progress Notes (Signed)
   Providing Compassionate, Quality Care - Together   Subjective: Patient reports no new issues. He is highly motivated to continue working with therapies. He feels like he is getting increased sensation in his thighs.  Objective: Vital signs in last 24 hours: Temp:  [97.7 F (36.5 C)-98.9 F (37.2 C)] 97.7 F (36.5 C) (06/06 0803) Pulse Rate:  [53-66] 53 (06/06 0803) Resp:  [14-15] 14 (06/06 0507) BP: (134-149)/(73-83) 134/82 (06/06 0803) SpO2:  [93 %-100 %] 100 % (06/06 0803)  Intake/Output from previous day: 06/05 0701 - 06/06 0700 In: 720 [P.O.:720] Out: -  Intake/Output this shift: No intake/output data recorded.  Alert and oriented x 4 PERRLA CN II-XII grossly intact MAE, decreased strength BLE Incision is clean, dry, and intact   Lab Results: Recent Labs    10/21/22 0935  WBC 13.7*  HGB 15.3  HCT 43.5  PLT 249   BMET Recent Labs    10/21/22 0935  NA 133*  K 4.2  CL 98  CO2 26  GLUCOSE 112*  BUN 22  CREATININE 0.78  CALCIUM 8.9    Studies/Results: No results found.  Assessment/Plan: Patient underwent a T10-T11 decompressive laminectomy by Dr. Jordan Likes on 10/15/2022. He is slowly regaining his mobility.   LOS: 8 days   -Awaiting insurance decision regarding CIR. -Continue to mobilize.   Val Eagle, DNP, AGNP-C Nurse Practitioner  [facility] [address], [address], [place], [place] [address] P: [phone]    F: [phone]  10/22/2022, 11:16 AM

## 2022-10-22 NOTE — Progress Notes (Signed)
Delivered patient's TOC discharge meds to RN. According to patient's nurse all discharge instructions were explain without any questions. IV was removed prior to my being ask to transport downstairs. All personal belongings are in the patient's possession to include home walker.

## 2022-10-22 NOTE — Discharge Summary (Signed)
DISCHARGE SUMMARY  Mitchell Morgan  MR#: [id number]  DOB:Mar 20, 1946  Date of Admission: 10/13/2022 Date of Discharge: 10/22/2022  Attending Physician:Bilbo Carcamo Silvestre Gunner, MD  Patient's WUJ:[id number], Mitchell Morgan., MD  Consults: Neurosurgery   Disposition: D/C home w/ HHPT  Follow-up Appts:  Follow-up Information     [facility] Follow up.   Specialty: Home Health Services Why: The home health agency will contact you for the first home visit Contact information: [address] [place] [address] [phone]         Julio Sicks, MD Follow up in 2 week(s).   Specialty: Neurosurgery Why: Call the office to arrange a follow up appointment. Contact information: [address] [place] [address] [phone]         Andreas Blower., MD Follow up in 1 week(s).   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  Initial presentation: 77 year old with a history of obesity, HTN, and HLD who presented to the hospital with complaints of back pain and bilateral lower extremity numbness. Workup was consistent with spinal stenosis at T10/T11 with lower thoracic myelopathy. Neurosurgery was consulted and recommended decompressive laminectomy.   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 Status post decompressive laminectomy 5/30 - treated with IV Decadron which is now being tapered to off in oral form -the initial plan was for placement within CIR for aggressive rehab -the patient's insurance company was exceedingly slow in responding to request for authorization for CIR -in the  meantime, with ongoing limited inpatient PT/OT, the patient improved to the point that he was stable to be discharged home with home health therapy instead -he will follow-up with his Neurosurgeon in the outpatient setting in 2 weeks   HTN Blood pressure well-controlled at time of discharge   DM 2 Does not require medication in outpatient setting -CBG reasonably controlled, even in setting of steroid which is currently being tapered to off   B12 deficiency B12 noted to be 195  -dosed with subcutaneous supplementation then transitioned to oral therapy - will need recheck in 6 to 8 weeks as outpatient to determine if oral supplementation is adequate or if recurring subcutaneous supplementation will be necessary   HLD Continue statin without change   Obesity - Body mass index is 31 kg/m.  Allergies as of 10/22/2022       Reactions   Iodine Other (See Comments)   Red Eyes, and Watering   Pravastatin Other (See Comments)   myalgia   Shrimp [shellfish Allergy] Swelling   Lobster, crab Swollen eyes   Simvastatin Other (See Comments)   myalgia        Medication List     STOP taking these medications    predniSONE 20 MG tablet Commonly known as: DELTASONE       TAKE these medications    acetaminophen 500 MG tablet Commonly known as: TYLENOL Take 500 mg by mouth in the morning.   amLODipine 10 MG tablet Commonly known as: NORVASC Take 10 mg by mouth daily.   Aspirin 81 81 MG tablet Generic drug:  aspirin EC Take 81 mg by mouth daily.   atenolol 50 MG tablet Commonly known as: TENORMIN Take 50 mg by mouth daily.   atorvastatin 20 MG tablet Commonly known as: LIPITOR Take 20 mg by mouth daily.   Cholecalciferol 50 MCG (2000 UT) Tabs Take 1 tablet by mouth daily.   cyanocobalamin 1000 MCG tablet Take 1 tablet (1,000 mcg total) by mouth daily. Start taking on: October 23, 2022   cyclobenzaprine 10 MG tablet Commonly known as: FLEXERIL Take 1 tablet (10 mg total)  by mouth 3 (three) times daily as needed for muscle spasms.   dexamethasone 4 MG tablet Commonly known as: DECADRON 1 tablet twice a day for 2 days, then 1 tablet daily for 2 days, then stop   diclofenac Sodium 1 % Gel Commonly known as: VOLTAREN Apply 2 g topically in the morning and at bedtime.   HYDROcodone-acetaminophen 10-325 MG tablet Commonly known as: NORCO Take 1-2 tablets by mouth every 4 (four) hours as needed for severe pain ((score 7 to 10)).   polyethylene glycol 17 g packet Commonly known as: MIRALAX / GLYCOLAX Take 17 g by mouth daily. Start taking on: October 23, 2022   Prolia 60 MG/ML Sosy injection Generic drug: denosumab Inject 60 mg into the skin every 6 (six) months.   senna-docusate 8.6-50 MG tablet Commonly known as: Senokot-S Take 2 tablets by mouth 2 (two) times daily.   zolpidem 10 MG tablet Commonly known as: AMBIEN Take 10 mg by mouth at bedtime as needed for sleep.               Durable Medical Equipment  (From admission, onward)           Start     Ordered   10/22/22 1501  For home use only DME Walker rolling  Once       Question Answer Comment  Walker: With 5 Inch Wheels   Patient needs a walker to treat with the following condition Weakness      10/22/22 1501            Day of Discharge BP 134/82   Pulse (!) 53   Temp 97.9 F (36.6 C)   Resp 14   Ht 5\' 10"  (1.778 m)   Wt 98 kg   SpO2 100%   BMI 31.00 kg/m   Physical Exam: General: No acute respiratory distress Lungs: Clear to auscultation bilaterally without wheezes or crackles Cardiovascular: Regular rate and rhythm without murmur gallop or rub normal S1 and S2 Abdomen: Nontender, nondistended, soft, bowel sounds positive, no rebound, no ascites, no appreciable mass Extremities: No significant cyanosis, clubbing, or edema bilateral lower extremities  Basic Metabolic Panel: Recent Labs  Lab 10/16/22 1106 10/19/22 0457 10/21/22 0935  NA 128* 131* 133*  K  4.0 4.7 4.2  CL 96* 98 98  CO2 23 24 26   GLUCOSE 172* 162* 112*  BUN 22 22 22   CREATININE 0.90 0.76 0.78  CALCIUM 8.7* 8.3* 8.9    CBC: Recent Labs  Lab 10/16/22 1106 10/19/22 0457 10/21/22 0935  WBC 16.5* 9.0 13.7*  HGB 14.4 12.8* 15.3  HCT 41.7 36.3* 43.5  MCV 90.3 92.6 89.0  PLT 275 193 249   Time spent in discharge (includes decision making & examination of pt): 35 minutes  10/22/2022, 3:11 PM   Lonia Blood, MD [facility] Office  [phone]

## 2022-10-22 NOTE — Progress Notes (Signed)
Inpatient Rehabilitation Admissions Coordinator   Patient has progressed to go home today.[facility] will [facility] of [facility].  Ottie Glazier, RN, MSN Rehab Admissions Coordinator [phone] 10/22/2022 3:14 PM

## 2022-10-22 NOTE — Progress Notes (Addendum)
Inpatient Rehabilitation Admissions Coordinator   I continue to await [facility] determination for a possible Cir admit. I have called again to clarify.  I discussed with patient that he, therapy and his wife need to discuss what more does he need to be able to do to progress to go home vs needs CIR prior to d/c home as I have not heard from the [place].  Ottie Glazier, RN, MSN Rehab Admissions Coordinator [phone] 10/22/2022 11:05 AM

## 2022-10-22 NOTE — Progress Notes (Signed)
Physical Therapy Treatment Patient Details Name: Mitchell Morgan MRN: [id number] DOB: [birth year] Today's Date: 10/22/2022   History of Present Illness Patient is 77 y.o. male s/p decompressive thoracic laminectomy at T10-11 on 10/15/22 due to stenosis w/ myelopathy. PMH significant for HTN.    PT Comments    Patient inquiring if able to go home with [facility] or OP or still recommending AIR. Explained my criteria for discharge home (ambulate min of 150 ft, up/down step, and has 24 hr supervision) Patient met all criteria this session and notified medical/surgical team of updated recommendation for OPPT. Patient very pleased and hopes to go home today.    Recommendations for follow up therapy are one component of a multi-disciplinary discharge planning process, led by the attending physician.  Recommendations may be updated based on patient status, additional functional criteria and insurance authorization.  Follow Up Recommendations       Assistance Recommended at Discharge Set up Supervision/Assistance  Patient can return home with the following Assist for transportation;Help with stairs or ramp for entrance   Equipment Recommendations  Rolling walker (2 wheels)    Recommendations for Other Services       Precautions / Restrictions Precautions Precautions: Fall;Back Precaution Booklet Issued: Yes (comment) Precaution Comments: pt able to state 3/3; required no cues to adhere Restrictions Weight Bearing Restrictions: No     Mobility  Bed Mobility Overal bed mobility: Needs Assistance Bed Mobility: Rolling, Sidelying to Sit Rolling: Supervision Sidelying to sit: Min guard       General bed mobility comments: pt recalled sequence; incr time with HOB flat and no rail    Transfers Overall transfer level: Needs assistance Equipment used: Rolling walker (2 wheels) Transfers: Sit to/from Stand Sit to Stand: Min guard           General transfer comment: recalled proper  sequence; slight posterior imbalance (caught himself) x 1    Ambulation/Gait Ambulation/Gait assistance: Land (Feet): 300 Feet Assistive device: Rolling walker (2 wheels) Gait Pattern/deviations: Step-through pattern, Decreased step length - right, Decreased step length - left, Decreased stride length, Ataxic Gait velocity: decreased     General Gait Details: Recalled cues for upright posture, heelstrike bil, and proper distance from RW. No knee buckling observed. Continues to be slightly ataxic, but with solid foot placement.   Stairs Stairs: Yes Stairs assistance: Min guard Stair Management: One rail Right, One rail Left, Step to pattern, Forwards Number of Stairs: 1 (x2 reps) General stair comments: pt has one step into apartment (up over threshold); educated on how to use RW for single step. With narrow practice step, he could not place step onto stair and therefore used elevated bed rail to simulate a rail/proper walker height.   Wheelchair Mobility    Modified Rankin (Stroke Patients Only)       Balance Overall balance assessment: Needs assistance Sitting-balance support: Feet supported Sitting balance-Leahy Scale: Good Sitting balance - Comments: Pt able to sit EOB without UE support and discuss precautions.   Standing balance support: During functional activity, No upper extremity supported Standing balance-Leahy Scale: Fair Standing balance comment: can stand without UE support; needs support for dynamic tasks                            Cognition Arousal/Alertness: Awake/alert Behavior During Therapy: WFL for tasks assessed/performed Overall Cognitive Status: Within Functional Limits for tasks assessed  General Comments: Able to recall back precations from previous session. Also able to recall sit to stand transfer technique        Exercises General Exercises - Lower Extremity Quad  Sets: AROM, Both, 5 reps (5 sec hold) Long Arc Quad: AROM, Both, 10 reps, Seated (slow, controlled with 5 sec hold) Heel Raises: AROM, Both, 15 reps    General Comments General comments (skin integrity, edema, etc.): Patient inquiring if able to go home with [facility] or OP or still recommending AIR. Explained my criteria for discharge home (ambulate min of 150 ft, up/down step, and has 24 hr supervision. Patient met all criteria this session and notified medical/surgical team of updated recommendation.      Pertinent Vitals/Pain Pain Assessment Pain Assessment: 0-10 Pain Score: 2  Pain Location: back Pain Descriptors / Indicators: Sore (Reported Sting in knees) Pain Intervention(s): Limited activity within patient's tolerance, Monitored during session    Home Living                          Prior Function            PT Goals (current goals can now be found in the care plan section) Acute Rehab PT Goals Patient Stated Goal: to regain maximum independence prior to return home PT Goal Formulation: With patient Time For Goal Achievement: 10/23/22 Potential to Achieve Goals: Good Progress towards PT goals: Progressing toward goals    Frequency    Min 5X/week      PT Plan Discharge plan needs to be updated    Co-evaluation              AM-PAC PT "6 Clicks" Mobility   Outcome Measure  Help needed turning from your back to your side while in a flat bed without using bedrails?: None Help needed moving from lying on your back to sitting on the side of a flat bed without using bedrails?: A Little Help needed moving to and from a bed to a chair (including a wheelchair)?: A Little Help needed standing up from a chair using your arms (e.g., wheelchair or bedside chair)?: A Little Help needed to walk in hospital room?: A Little Help needed climbing 3-5 steps with a railing? : A Little 6 Click Score: 19    End of Session Equipment Utilized During Treatment: Gait  belt Activity Tolerance: Patient tolerated treatment well Patient left: with call bell/phone within reach;in chair Nurse Communication: Mobility status;Other (comment) (ok to discharge from PT perspective) PT Visit Diagnosis: Unsteadiness on feet (R26.81);History of falling (Z91.81) Pain - Right/Left:  (Bilateral) Pain - part of body: Knee     Time: 1350-1436 PT Time Calculation (min) (ACUTE ONLY): 46 min  Charges:  $Gait Training: 23-37 mins $Therapeutic Exercise: 8-22 mins                      Jerolyn Center, PT Acute Rehabilitation Services  Office [phone]    Zena Amos 10/22/2022, 3:00 PM

## 2022-10-22 NOTE — TOC Transition Note (Signed)
Transition of Care [facility]) - CM/SW Discharge Note   Patient Details  Name: Mitchell Morgan MRN: [id number] Date of Birth: 11/26/1945  [facility] of [facility]) CM/SW Contact:  Kermit Balo, RN Phone Number: 10/22/2022, 3:09 PM   Clinical Narrative:    Pt doing well enough to d/c home with home health services. CM provided choice and [facility] decided on. Information on the AVS. Frances Furbish will contact him for the first home visit. Walker ordered through [employer] and will be delivered to the room.  Pt has supervision at home and transportation to home.   Final next level of care: Home w Home Health Services Barriers to Discharge: No Barriers Identified   Patient Goals and CMS Choice CMS Medicare.gov Compare Post Acute Care list provided to:: Patient Choice offered to / list presented to : Patient  Discharge Placement                         Discharge Plan and Services Additional resources added to the After Visit Summary for                  DME Arranged: Walker rolling DME Agency: [employer] Date DME Agency Contacted: 10/22/22   Representative spoke with at DME Agency: Marthann Schiller HH Arranged: PT, OT HH Agency: [facility] Date [facility] Agency Contacted: 10/22/22   Representative spoke with at [facility] Agency: cory  Social Determinants of Health (SDOH) Interventions SDOH Screenings   Food Insecurity: No Food Insecurity (10/14/2022)  Housing: Low Risk  (10/14/2022)  Transportation Needs: No Transportation Needs (10/14/2022)  Utilities: Not At Risk (10/14/2022)  Tobacco Use: Low Risk  (10/16/2022)     Readmission Risk Interventions     No data to display

## 2022-10-25 ENCOUNTER — Encounter (HOSPITAL_COMMUNITY): Payer: Self-pay

## 2022-10-25 ENCOUNTER — Emergency Department (HOSPITAL_COMMUNITY): Payer: No Typology Code available for payment source

## 2022-10-25 ENCOUNTER — Inpatient Hospital Stay (HOSPITAL_COMMUNITY)
Admission: EM | Admit: 2022-10-25 | Discharge: 2022-10-29 | DRG: 552 | Disposition: A | Discharge status: Rehabilitation Facility | Payer: No Typology Code available for payment source | Attending: Neurosurgery | Admitting: Neurosurgery

## 2022-10-25 DIAGNOSIS — G4700 Insomnia, unspecified: Secondary | ICD-10-CM | POA: Diagnosis not present

## 2022-10-25 DIAGNOSIS — Z79899 Other long term (current) drug therapy: Secondary | ICD-10-CM

## 2022-10-25 DIAGNOSIS — Z7409 Other reduced mobility: Secondary | ICD-10-CM | POA: Diagnosis present

## 2022-10-25 DIAGNOSIS — R7303 Prediabetes: Secondary | ICD-10-CM | POA: Diagnosis not present

## 2022-10-25 DIAGNOSIS — Z91013 Allergy to seafood: Secondary | ICD-10-CM

## 2022-10-25 DIAGNOSIS — Z7982 Long term (current) use of aspirin: Secondary | ICD-10-CM | POA: Diagnosis not present

## 2022-10-25 DIAGNOSIS — G992 Myelopathy in diseases classified elsewhere: Secondary | ICD-10-CM | POA: Diagnosis present

## 2022-10-25 DIAGNOSIS — R1084 Generalized abdominal pain: Secondary | ICD-10-CM

## 2022-10-25 DIAGNOSIS — K219 Gastro-esophageal reflux disease without esophagitis: Secondary | ICD-10-CM | POA: Diagnosis present

## 2022-10-25 DIAGNOSIS — E7800 Pure hypercholesterolemia, unspecified: Secondary | ICD-10-CM | POA: Diagnosis present

## 2022-10-25 DIAGNOSIS — M4714 Other spondylosis with myelopathy, thoracic region: Secondary | ICD-10-CM | POA: Diagnosis not present

## 2022-10-25 DIAGNOSIS — K592 Neurogenic bowel, not elsewhere classified: Secondary | ICD-10-CM | POA: Diagnosis not present

## 2022-10-25 DIAGNOSIS — M4804 Spinal stenosis, thoracic region: Principal | ICD-10-CM | POA: Diagnosis present

## 2022-10-25 DIAGNOSIS — Z888 Allergy status to other drugs, medicaments and biological substances status: Secondary | ICD-10-CM | POA: Diagnosis not present

## 2022-10-25 DIAGNOSIS — R531 Weakness: Secondary | ICD-10-CM | POA: Diagnosis present

## 2022-10-25 DIAGNOSIS — I10 Essential (primary) hypertension: Secondary | ICD-10-CM | POA: Diagnosis present

## 2022-10-25 DIAGNOSIS — R270 Ataxia, unspecified: Secondary | ICD-10-CM | POA: Diagnosis present

## 2022-10-25 DIAGNOSIS — N319 Neuromuscular dysfunction of bladder, unspecified: Secondary | ICD-10-CM | POA: Diagnosis not present

## 2022-10-25 DIAGNOSIS — E871 Hypo-osmolality and hyponatremia: Secondary | ICD-10-CM | POA: Diagnosis not present

## 2022-10-25 DIAGNOSIS — G8929 Other chronic pain: Secondary | ICD-10-CM | POA: Diagnosis not present

## 2022-10-25 DIAGNOSIS — M25512 Pain in left shoulder: Secondary | ICD-10-CM | POA: Diagnosis not present

## 2022-10-25 DIAGNOSIS — R7989 Other specified abnormal findings of blood chemistry: Secondary | ICD-10-CM | POA: Diagnosis not present

## 2022-10-25 DIAGNOSIS — R630 Anorexia: Secondary | ICD-10-CM | POA: Diagnosis present

## 2022-10-25 DIAGNOSIS — K5900 Constipation, unspecified: Secondary | ICD-10-CM | POA: Diagnosis present

## 2022-10-25 HISTORY — DX: Unspecified disorder of synovium and tendon, left shoulder: M67.912

## 2022-10-25 HISTORY — DX: Pain in right hip: M25.551

## 2022-10-25 HISTORY — DX: Prediabetes: R73.03

## 2022-10-25 HISTORY — DX: Gastro-esophageal reflux disease without esophagitis: K21.9

## 2022-10-25 HISTORY — DX: Pain in right hip: M25.552

## 2022-10-25 HISTORY — DX: Insomnia, unspecified: G47.00

## 2022-10-25 HISTORY — DX: Pain in right knee: M25.562

## 2022-10-25 HISTORY — DX: Pain in right knee: M25.561

## 2022-10-25 HISTORY — DX: Benign prostatic hyperplasia without lower urinary tract symptoms: N40.0

## 2022-10-25 HISTORY — DX: Diverticulosis of intestine, part unspecified, without perforation or abscess without bleeding: K57.90

## 2022-10-25 HISTORY — DX: Basal cell carcinoma of skin, unspecified: C44.91

## 2022-10-25 LAB — COMPREHENSIVE METABOLIC PANEL
ALT: 46 U/L — ABNORMAL HIGH (ref 0–44)
AST: 27 U/L (ref 15–41)
Albumin: 3.1 g/dL — ABNORMAL LOW (ref 3.5–5.0)
Alkaline Phosphatase: 50 U/L (ref 38–126)
Anion gap: 13 (ref 5–15)
BUN: 19 mg/dL (ref 8–23)
CO2: 23 mmol/L (ref 22–32)
Calcium: 9.1 mg/dL (ref 8.9–10.3)
Chloride: 92 mmol/L — ABNORMAL LOW (ref 98–111)
Creatinine, Ser: 0.86 mg/dL (ref 0.61–1.24)
GFR, Estimated: >60 mL/min (ref >60)
Glucose, Bld: 148 mg/dL — ABNORMAL HIGH (ref 70–99)
Potassium: 4.9 mmol/L (ref 3.5–5.1)
Sodium: 128 mmol/L — ABNORMAL LOW (ref 135–145)
Total Bilirubin: 1.3 mg/dL — ABNORMAL HIGH (ref 0.3–1.2)
Total Protein: 5.8 g/dL — ABNORMAL LOW (ref 6.5–8.1)

## 2022-10-25 LAB — CBC WITH DIFFERENTIAL/PLATELET
Abs Immature Granulocytes: 0.09 10*3/uL — ABNORMAL HIGH (ref 0.00–0.07)
Basophils Absolute: 0 10*3/uL (ref 0.0–0.1)
Basophils Relative: 0 %
Eosinophils Absolute: 0 10*3/uL (ref 0.0–0.5)
Eosinophils Relative: 0 %
HCT: 45.3 % (ref 39.0–52.0)
Hemoglobin: 15.8 g/dL (ref 13.0–17.0)
Immature Granulocytes: 1 %
Lymphocytes Relative: 7 %
Lymphs Abs: 0.9 10*3/uL (ref 0.7–4.0)
MCH: 32.5 pg (ref 26.0–34.0)
MCHC: 34.9 g/dL (ref 30.0–36.0)
MCV: 93.2 fL (ref 80.0–100.0)
Monocytes Absolute: 0.6 10*3/uL (ref 0.1–1.0)
Monocytes Relative: 4 %
Neutro Abs: 11.4 10*3/uL — ABNORMAL HIGH (ref 1.7–7.7)
Neutrophils Relative %: 88 %
Platelets: 237 10*3/uL (ref 150–400)
RBC: 4.86 MIL/uL (ref 4.22–5.81)
RDW: 11.9 % (ref 11.5–15.5)
WBC: 13 10*3/uL — ABNORMAL HIGH (ref 4.0–10.5)
nRBC: 0 % (ref 0.0–0.2)

## 2022-10-25 LAB — LIPASE, BLOOD: Lipase: 36 U/L (ref 11–51)

## 2022-10-25 MED ORDER — HYDROCODONE-ACETAMINOPHEN 10-325 MG PO TABS
1.0000 | ORAL_TABLET | ORAL | Status: DC | PRN
  Administered 2022-10-26: 1 via ORAL
  Administered 2022-10-27 (×2): 0.5 via ORAL
  Administered 2022-10-28 (×2): 1 via ORAL
  Filled 2022-10-25 (×5): qty 1

## 2022-10-25 MED ORDER — SENNOSIDES-DOCUSATE SODIUM 8.6-50 MG PO TABS
2.0000 | ORAL_TABLET | Freq: Two times a day (BID) | ORAL | Status: DC
  Administered 2022-10-26 – 2022-10-29 (×8): 2 via ORAL
  Filled 2022-10-25 (×8): qty 2

## 2022-10-25 MED ORDER — ATORVASTATIN CALCIUM 10 MG PO TABS
20.0000 mg | ORAL_TABLET | Freq: Every day | ORAL | Status: DC
  Administered 2022-10-26 – 2022-10-29 (×4): 20 mg via ORAL
  Filled 2022-10-25 (×4): qty 2

## 2022-10-25 MED ORDER — FLEET ENEMA 7-19 GM/118ML RE ENEM: 1.0000 | ENEMA | Freq: Once | RECTAL | Status: DC | PRN

## 2022-10-25 MED ORDER — IOHEXOL 350 MG/ML SOLN
75.0000 mL | Freq: Once | INTRAVENOUS | Status: AC | PRN
  Administered 2022-10-25: 75 mL via INTRAVENOUS

## 2022-10-25 MED ORDER — ONDANSETRON HCL 4 MG PO TABS: 4.0000 mg | ORAL_TABLET | Freq: Four times a day (QID) | ORAL | Status: DC | PRN

## 2022-10-25 MED ORDER — HYDRALAZINE HCL 20 MG/ML IJ SOLN: 5.0000 mg | Freq: Four times a day (QID) | INTRAMUSCULAR | Status: DC | PRN

## 2022-10-25 MED ORDER — ACETAMINOPHEN 500 MG PO TABS
500.0000 mg | ORAL_TABLET | Freq: Every morning | ORAL | Status: DC
  Administered 2022-10-26 – 2022-10-29 (×4): 500 mg via ORAL
  Filled 2022-10-25 (×5): qty 1

## 2022-10-25 MED ORDER — ATENOLOL 50 MG PO TABS
50.0000 mg | ORAL_TABLET | Freq: Every day | ORAL | Status: DC
  Administered 2022-10-26 – 2022-10-29 (×4): 50 mg via ORAL
  Filled 2022-10-25 (×4): qty 1

## 2022-10-25 MED ORDER — CYCLOBENZAPRINE HCL 10 MG PO TABS
10.0000 mg | ORAL_TABLET | Freq: Three times a day (TID) | ORAL | Status: DC | PRN
  Administered 2022-10-26 – 2022-10-28 (×4): 10 mg via ORAL
  Filled 2022-10-25 (×5): qty 1

## 2022-10-25 MED ORDER — ONDANSETRON HCL 4 MG/2ML IJ SOLN: 4.0000 mg | Freq: Four times a day (QID) | INTRAMUSCULAR | Status: DC | PRN

## 2022-10-25 MED ORDER — ONDANSETRON HCL 4 MG/2ML IJ SOLN
4.0000 mg | Freq: Once | INTRAMUSCULAR | Status: AC
  Administered 2022-10-25: 4 mg via INTRAVENOUS
  Filled 2022-10-25: qty 2

## 2022-10-25 MED ORDER — POLYETHYLENE GLYCOL 3350 17 G PO PACK
17.0000 g | PACK | Freq: Every day | ORAL | Status: DC
  Administered 2022-10-26 – 2022-10-29 (×4): 17 g via ORAL
  Filled 2022-10-25 (×4): qty 1

## 2022-10-25 MED ORDER — ACETAMINOPHEN 650 MG RE SUPP: 650.0000 mg | Freq: Four times a day (QID) | RECTAL | Status: DC | PRN

## 2022-10-25 MED ORDER — ACETAMINOPHEN 325 MG PO TABS: 650.0000 mg | ORAL_TABLET | Freq: Four times a day (QID) | ORAL | Status: DC | PRN

## 2022-10-25 MED ORDER — FENTANYL CITRATE PF 50 MCG/ML IJ SOSY
50.0000 ug | PREFILLED_SYRINGE | Freq: Once | INTRAMUSCULAR | Status: AC
  Administered 2022-10-25: 50 ug via INTRAVENOUS
  Filled 2022-10-25: qty 1

## 2022-10-25 MED ORDER — ZOLPIDEM TARTRATE 5 MG PO TABS: 10.0000 mg | ORAL_TABLET | Freq: Every evening | ORAL | Status: DC | PRN

## 2022-10-25 MED ORDER — AMLODIPINE BESYLATE 10 MG PO TABS
10.0000 mg | ORAL_TABLET | Freq: Every day | ORAL | Status: DC
  Administered 2022-10-26 – 2022-10-29 (×4): 10 mg via ORAL
  Filled 2022-10-25 (×4): qty 1

## 2022-10-25 MED ORDER — BISACODYL 10 MG RE SUPP
10.0000 mg | Freq: Every day | RECTAL | Status: DC | PRN
  Filled 2022-10-25: qty 1

## 2022-10-25 MED ORDER — VITAMIN B-12 1000 MCG PO TABS
1000.0000 ug | ORAL_TABLET | Freq: Every day | ORAL | Status: DC
  Administered 2022-10-26 – 2022-10-29 (×4): 1000 ug via ORAL
  Filled 2022-10-25 (×4): qty 1

## 2022-10-25 MED ORDER — SODIUM CHLORIDE 0.9 % IV SOLN: INTRAVENOUS | Status: DC

## 2022-10-25 MED ORDER — PANTOPRAZOLE SODIUM 20 MG PO TBEC: 20.0000 mg | DELAYED_RELEASE_TABLET | Freq: Every day | ORAL | Status: DC

## 2022-10-25 MED ORDER — VITAMIN D 25 MCG (1000 UNIT) PO TABS
2000.0000 [IU] | ORAL_TABLET | Freq: Every day | ORAL | Status: DC
  Administered 2022-10-26 – 2022-10-29 (×4): 2000 [IU] via ORAL
  Filled 2022-10-25 (×4): qty 2

## 2022-10-25 MED ORDER — ASPIRIN 81 MG PO TBEC
81.0000 mg | DELAYED_RELEASE_TABLET | Freq: Every day | ORAL | Status: DC
  Administered 2022-10-26 – 2022-10-29 (×4): 81 mg via ORAL
  Filled 2022-10-25 (×4): qty 1

## 2022-10-25 NOTE — ED Provider Notes (Signed)
[place] EMERGENCY DEPARTMENT AT [facility] Provider Note   CSN: [id number] Arrival date & time: 10/25/22  2003     History  Chief Complaint  Patient presents with   Abdominal Pain   Leg Pain    Mitchell Morgan is a 77 y.o. male.  HPI Patient presents 2 weeks after thoracic spine surgery with concern for abdominal pain, decreased bowel movements. 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  Patient notes that on discharge he continue to have lower extremity paresthesia, essentially unchanged and his remained so since that time.  However he developed new abdominal pain primarily in the upper abdomen, no bowel movements in 3 days.  No actual vomiting, though there is anorexia. No fever, no chest pain, no dyspnea. No drainage from his surgical incision.  No true incontinence.    Home Medications Prior to Admission medications   Medication Sig Start Date End Date Taking? Authorizing Provider  amLODipine (NORVASC) 10 MG tablet Take 10 mg by mouth daily.   Yes [provider]  aspirin EC (ASPIRIN 81) 81 MG tablet Take 81 mg by mouth daily.   Yes [provider]  atenolol (TENORMIN) 50 MG tablet Take 50 mg by mouth daily. 06/07/15  Yes [provider]  atorvastatin (LIPITOR) 20 MG tablet Take 20 mg by mouth daily. 06/07/15  Yes [provider]  pantoprazole (PROTONIX) 20 MG tablet Take 20 mg by mouth daily.   Yes [provider]  zolpidem (AMBIEN) 10 MG tablet Take 10 mg by mouth at bedtime as needed for sleep. 06/07/15  Yes [provider]  acetaminophen (TYLENOL) 500 MG tablet Take 500 mg by mouth in the morning.    [provider]  Cholecalciferol 50 MCG (2000 UT) TABS Take 1 tablet by mouth daily. 03/31/17   [provider]  cyanocobalamin 1000 MCG tablet Take 1 tablet (1,000 mcg total) by mouth daily. 10/23/22   Lonia Blood, MD  cyclobenzaprine (FLEXERIL) 10 MG  tablet Take 1 tablet (10 mg total) by mouth 3 (three) times daily as needed for muscle spasms. 10/22/22   Lonia Blood, MD  dexamethasone (DECADRON) 4 MG tablet Take 1 tablet twice a day for 2 days, then 1 tablet daily for 2 days, then stop 10/22/22   Lonia Blood, MD  diclofenac Sodium (VOLTAREN) 1 % GEL Apply 2 g topically in the morning and at bedtime. 04/23/22   [provider]  HYDROcodone-acetaminophen (NORCO) 10-325 MG tablet Take 1-2 tablets by mouth every 4 (four) hours as needed for severe pain ((score 7 to 10)). 10/22/22   Lonia Blood, MD  polyethylene glycol powder (GLYCOLAX/MIRALAX) 17 GM/SCOOP powder Take 1 capful (17 g) by mouth daily. 10/23/22   Lonia Blood, MD  PROLIA 60 MG/ML SOSY injection Inject 60 mg into the skin every 6 (six) months. 04/06/22   [provider]  senna-docusate (SENOKOT-S) 8.6-50 MG tablet Take 2 tablets by mouth 2 (two) times daily. 10/22/22   Lonia Blood, MD      Allergies    Iodine, Pravastatin, Shrimp [shellfish allergy], and Simvastatin    Review of Systems   Review of Systems  All other systems reviewed and are negative.   Physical Exam Updated Vital Signs BP (!) 141/117 (BP Location: Left Arm)   Pulse 60   Temp 98.4 F (36.9 C) (Oral)   Resp 15   SpO2 100%  Physical Exam Vitals and nursing note reviewed.  Constitutional:      General: He is not in acute distress.    Appearance: He is well-developed.  HENT:     Head: Normocephalic and atraumatic.  Eyes:     Conjunctiva/sclera: Conjunctivae normal.  Cardiovascular:     Rate and Rhythm: Normal rate and regular rhythm.  Pulmonary:     Effort: Pulmonary effort is normal. No respiratory distress.     Breath sounds: No stridor.  Abdominal:     General: There is no distension.     Tenderness: There is generalized abdominal tenderness.  Skin:    General: Skin is warm and dry.       Neurological:     Mental Status: He is alert and oriented to  person, place, and time.     Comments: Patient notes that he has a heavy sensation in both legs, though he follows commands moving each of them appropriately to command.  He moves his toes appropriately.     ED Results / Procedures / Treatments   Labs (all labs ordered are listed, but only abnormal results are displayed) Labs Reviewed  COMPREHENSIVE METABOLIC PANEL - Abnormal; Notable for the following components:      Result Value   Sodium 128 (*)    Chloride 92 (*)    Glucose, Bld 148 (*)    Total Protein 5.8 (*)    Albumin 3.1 (*)    ALT 46 (*)    Total Bilirubin 1.3 (*)    All other components within normal limits  CBC WITH DIFFERENTIAL/PLATELET - Abnormal; Notable for the following components:   WBC 13.0 (*)    Neutro Abs 11.4 (*)    Abs Immature Granulocytes 0.09 (*)    All other components within normal limits  LIPASE, BLOOD  URINALYSIS, ROUTINE W REFLEX MICROSCOPIC    EKG None  Radiology CT ABDOMEN PELVIS W CONTRAST  Result Date: 10/25/2022 CLINICAL DATA:  Bowel obstruction suspected Recent back surgery. EXAM: CT ABDOMEN AND PELVIS WITH CONTRAST TECHNIQUE: Multidetector CT imaging of the abdomen and pelvis was performed using the standard protocol following bolus administration of intravenous contrast. RADIATION DOSE REDUCTION: This exam was performed according to the departmental dose-optimization program which includes automated exposure control, adjustment of the mA and/or kV according to patient size and/or use of iterative reconstruction technique. CONTRAST:  75mL OMNIPAQUE IOHEXOL 350 MG/ML SOLN COMPARISON:  CT 08/14/2017 FINDINGS: Lower chest: Upper normal heart size with coronary artery calcifications. No pleural fluid or basilar airspace disease. Hepatobiliary: No focal liver abnormality is seen. No gallstones, gallbladder wall thickening, or biliary dilatation. Pancreas: Unremarkable. No pancreatic ductal dilatation or surrounding inflammatory changes. Spleen: Small  in size. No focal abnormality. Adrenals/Urinary Tract: Normal adrenal glands. No hydronephrosis or perinephric edema. Homogeneous renal enhancement with symmetric excretion on delayed phase imaging. Simple left renal cyst, needs no further imaging follow-up. Urinary bladder is physiologically distended without wall thickening. Stomach/Bowel: There is moderate wall thickening about the duodenum, series 3, image 33 with adjacent peri duodenal edema and stranding. Air and fluid distention of the stomach. No definite gastric wall thickening. Occasional fluid-filled proximal small bowel but no abnormal distension or small bowel obstruction. Normal appendix. Moderate diffuse colonic stool burden. Distal descending and sigmoid colonic diverticulosis without diverticulitis. Vascular/Lymphatic: Aortic atherosclerosis. No aneurysm. Patent portal vein. No abdominopelvic adenopathy. Reproductive: Enlarged prostate spans 6.4 cm transverse. Other: No free air or focal fluid collection. No ascites. Musculoskeletal: Recent T10-T11 decompression, no definite postoperative fluid collection. Diffuse lumbar degenerative change. Vertebral body  hemangioma within L1. IMPRESSION: 1. Moderate wall thickening about the duodenum with adjacent peri duodenal edema and stranding, suspicious for duodenitis or peptic ulcer disease. No perforation or abscess. 2. Mild gastric distension likely related to duodenal inflammation. No bowel obstruction. 3. Colonic diverticulosis without diverticulitis. 4. Enlarged prostate. 5. Recent T10-T11 decompression, no evident postoperative fluid collection. Aortic Atherosclerosis (ICD10-I70.0). Electronically Signed   By: Narda Rutherford M.D.   On: 10/25/2022 22:14    Procedures Procedures    Medications Ordered in ED Medications  0.9 %  sodium chloride infusion ( Intravenous New Bag/Given 10/25/22 2126)  fentaNYL (SUBLIMAZE) injection 50 mcg (50 mcg Intravenous Given 10/25/22 2130)  ondansetron (ZOFRAN)  injection 4 mg (4 mg Intravenous Given 10/25/22 2128)  iohexol (OMNIPAQUE) 350 MG/ML injection 75 mL (75 mLs Intravenous Contrast Given 10/25/22 2202)    ED Course/ Medical Decision Making/ A&P                             Medical Decision Making Adult male recent neurosurgical procedure presents with abdominal pain, decreased bowel movements.  Patient description of no change in his lower extremities following the procedure as generally reassuring, though improvement following the procedure would have been expected. Patient has no new incontinence, there is low suspicion for new acute CNS pathology. However, the patient does have abdominal pain, decreased bowel movements and anorexia suggesting concern for bowel obstruction or other intra-abdominal processes. CT ordered, patient placed on continuous cardiac monitoring.  Cardiac 75 sinus normal Pulse ox 98% room air normal   Amount and/or Complexity of Data Reviewed External Data Reviewed: notes.    Details: Review of discharge summary from 2 weeks ago noted Labs: ordered. Decision-making details documented in ED Course. Radiology: ordered and independent interpretation performed. Decision-making details documented in ED Course.  Risk Prescription drug management. Decision regarding hospitalization.   11:34 PM Patient accompanied by 2 family members.  We discussed his presentation, including ongoing difficulty with ambulation, paresthesia.  I reviewed his CT scan, discussed it with the family at length.  Labs consistent with prior, including mild hyponatremia, but no notable substantial abnormalities. Some suspicion for duodenitis/gastritis contributing to his discomfort, patient is already on a PPI, H. pylori test will be sent as well. Given his ongoing paresthesia, difficulty with ambulation, I discussed his case with our neurosurgery team and will be admitted to their service for additional evaluation.  Again from the neurosurgery  service accepts for Dr. Ethelene Browns team.        Final Clinical Impression(s) / ED Diagnoses Final diagnoses:  Thoracic myelopathy  Generalized abdominal pain     Gerhard Munch, MD 10/25/22 2336

## 2022-10-25 NOTE — H&P (Signed)
Mitchell Morgan is an 77 y.o. male.   Chief Complaint: Bilateral leg weakness HPI: Mitchell Morgan has a past medical history of GERD, high cholesterol, and hypertension. He was recently admitted to [facility] for thoracic stenosis with myelopathy. He underwent T10-T11 decompressive laminectomy by Dr. Jordan Likes on 10/15/2022 . He did well postoperatively and it was initially recommended he go to inpatient rehab. However, approval was never received from his insurance. He decided to discharge home with home health services on 10/22/2022. Unfortunately, Mitchell Morgan has struggled with mobility and constipation at home. He has not had any issues with his surgical wound. He feels the paresthesias in BLE is stable since he was discharged. The CT abdomen/pelvis performed in the emergency department showed some signs of peptic ulcer disease. There was no fluid collection or complication at the area of his surgery. Mitchell Morgan has not had any fever, nausea, or vomiting. He has had a loss of appetite.  Past Medical History:  Diagnosis Date   GERD (gastroesophageal reflux disease)    High cholesterol    Hypertension     Past Surgical History:  Procedure Laterality Date   DECOMPRESSIVE LUMBAR LAMINECTOMY LEVEL 1 N/A 10/15/2022   Procedure: DECOMPRESSIVE thoracic laminectomy THORACIC TEN -ELEVEN;  Surgeon: Julio Sicks, MD;  Location: [facility] OR;  Service: Neurosurgery;  Laterality: N/A;    History reviewed. No pertinent family history. Social History:  reports that he has never smoked. He has never used smokeless tobacco. He reports that he does not drink alcohol and does not use drugs.  Allergies:  Allergies  Allergen Reactions   Iodine Other (See Comments)    Red Eyes, and Watering   Pravastatin Other (See Comments)    myalgia   Shrimp [Shellfish Allergy] Swelling    Lobster, crab  Swollen eyes   Simvastatin Other (See Comments)    myalgia    (Not in a hospital admission)   Results for orders placed or  performed during the hospital encounter of 10/25/22 (from the past 48 hour(s))  Comprehensive metabolic panel     Status: Abnormal   Collection Time: 10/25/22  9:25 PM  Result Value Ref Range   Sodium 128 (L) 135 - 145 mmol/L   Potassium 4.9 3.5 - 5.1 mmol/L   Chloride 92 (L) 98 - 111 mmol/L   CO2 23 22 - 32 mmol/L   Glucose, Bld 148 (H) 70 - 99 mg/dL    Comment: Glucose reference range applies only to samples taken after fasting for at least 8 hours.   BUN 19 8 - 23 mg/dL   Creatinine, Ser 1.61 0.61 - 1.24 mg/dL   Calcium 9.1 8.9 - 09.6 mg/dL   Total Protein 5.8 (L) 6.5 - 8.1 g/dL   Albumin 3.1 (L) 3.5 - 5.0 g/dL   AST 27 15 - 41 U/L   ALT 46 (H) 0 - 44 U/L   Alkaline Phosphatase 50 38 - 126 U/L   Total Bilirubin 1.3 (H) 0.3 - 1.2 mg/dL   GFR, Estimated >04 >54 mL/min    Comment: (NOTE) Calculated using the CKD-EPI Creatinine Equation (2021)    Anion gap 13 5 - 15    Comment: Performed at [facility], [address], [place], [place] [address]  Lipase, blood     Status: None   Collection Time: 10/25/22  9:25 PM  Result Value Ref Range   Lipase 36 11 - 51 U/L    Comment: Performed at [facility], [address] [place], [place] [address]  CBC with Diff     Status: Abnormal   Collection Time: 10/25/22  9:25 PM  Result Value Ref Range   WBC 13.0 (H) 4.0 - 10.5 K/uL   RBC 4.86 4.22 - 5.81 MIL/uL   Hemoglobin 15.8 13.0 - 17.0 g/dL   HCT 60.4 54.0 - 98.1 %   MCV 93.2 80.0 - 100.0 fL   MCH 32.5 26.0 - 34.0 pg   MCHC 34.9 30.0 - 36.0 g/dL   RDW 19.1 47.8 - 29.5 %   Platelets 237 150 - 400 K/uL   nRBC 0.0 0.0 - 0.2 %   Neutrophils Relative % 88 %   Neutro Abs 11.4 (H) 1.7 - 7.7 K/uL   Lymphocytes Relative 7 %   Lymphs Abs 0.9 0.7 - 4.0 K/uL   Monocytes Relative 4 %   Monocytes Absolute 0.6 0.1 - 1.0 K/uL   Eosinophils Relative 0 %   Eosinophils Absolute 0.0 0.0 - 0.5 K/uL   Basophils Relative 0 %   Basophils Absolute 0.0 0.0 - 0.1 K/uL   Immature  Granulocytes 1 %   Abs Immature Granulocytes 0.09 (H) 0.00 - 0.07 K/uL    Comment: Performed at [facility], [address], [place], [place] [address]   CT ABDOMEN PELVIS W CONTRAST  Result Date: 10/25/2022 CLINICAL DATA:  Bowel obstruction suspected Recent back surgery. EXAM: CT ABDOMEN AND PELVIS WITH CONTRAST TECHNIQUE: Multidetector CT imaging of the abdomen and pelvis was performed using the standard protocol following bolus administration of intravenous contrast. RADIATION DOSE REDUCTION: This exam was performed according to the departmental dose-optimization program which includes automated exposure control, adjustment of the mA and/or kV according to patient size and/or use of iterative reconstruction technique. CONTRAST:  75mL OMNIPAQUE IOHEXOL 350 MG/ML SOLN COMPARISON:  CT 08/14/2017 FINDINGS: Lower chest: Upper normal heart size with coronary artery calcifications. No pleural fluid or basilar airspace disease. Hepatobiliary: No focal liver abnormality is seen. No gallstones, gallbladder wall thickening, or biliary dilatation. Pancreas: Unremarkable. No pancreatic ductal dilatation or surrounding inflammatory changes. Spleen: Small in size. No focal abnormality. Adrenals/Urinary Tract: Normal adrenal glands. No hydronephrosis or perinephric edema. Homogeneous renal enhancement with symmetric excretion on delayed phase imaging. Simple left renal cyst, needs no further imaging follow-up. Urinary bladder is physiologically distended without wall thickening. Stomach/Bowel: There is moderate wall thickening about the duodenum, series 3, image 33 with adjacent peri duodenal edema and stranding. Air and fluid distention of the stomach. No definite gastric wall thickening. Occasional fluid-filled proximal small bowel but no abnormal distension or small bowel obstruction. Normal appendix. Moderate diffuse colonic stool burden. Distal descending and sigmoid colonic diverticulosis without  diverticulitis. Vascular/Lymphatic: Aortic atherosclerosis. No aneurysm. Patent portal vein. No abdominopelvic adenopathy. Reproductive: Enlarged prostate spans 6.4 cm transverse. Other: No free air or focal fluid collection. No ascites. Musculoskeletal: Recent T10-T11 decompression, no definite postoperative fluid collection. Diffuse lumbar degenerative change. Vertebral body hemangioma within L1. IMPRESSION: 1. Moderate wall thickening about the duodenum with adjacent peri duodenal edema and stranding, suspicious for duodenitis or peptic ulcer disease. No perforation or abscess. 2. Mild gastric distension likely related to duodenal inflammation. No bowel obstruction. 3. Colonic diverticulosis without diverticulitis. 4. Enlarged prostate. 5. Recent T10-T11 decompression, no evident postoperative fluid collection. Aortic Atherosclerosis (ICD10-I70.0). Electronically Signed   By: Narda Rutherford M.D.   On: 10/25/2022 22:14    Review of Systems  Constitutional:  Positive for activity change and appetite change. Negative for  chills, fatigue and fever.  HENT: Negative.    Eyes: Negative.   Respiratory: Negative.    Gastrointestinal:  Positive for abdominal distention, abdominal pain and constipation. Negative for anal bleeding, blood in stool, diarrhea, nausea and vomiting.  Endocrine: Negative.   Genitourinary: Negative.   Musculoskeletal:  Positive for back pain, gait problem and myalgias.  Allergic/Immunologic: Negative.   Neurological:  Positive for weakness, light-headedness and numbness. Negative for dizziness, seizures, syncope, facial asymmetry, speech difficulty and headaches.  Hematological: Negative.   Psychiatric/Behavioral: Negative.      Blood pressure (!) 141/117, pulse 60, temperature 98.4 F (36.9 C), temperature source Oral, resp. rate 15, SpO2 100 %. Physical Exam Constitutional:      Appearance: He is well-developed.  HENT:     Head: Normocephalic and atraumatic.      Mouth/Throat:     Mouth: Mucous membranes are moist.     Pharynx: Oropharynx is clear.  Eyes:     Extraocular Movements: Extraocular movements intact.     Pupils: Pupils are equal, round, and reactive to light.  Cardiovascular:     Rate and Rhythm: Normal rate and regular rhythm.  Pulmonary:     Effort: Pulmonary effort is normal. No respiratory distress.  Abdominal:     General: There is no distension.     Palpations: Abdomen is soft.     Tenderness: There is no abdominal tenderness.  Skin:    General: Skin is warm and dry.     Capillary Refill: Capillary refill takes less than 2 seconds.  Neurological:     General: No focal deficit present.     Mental Status: He is alert and oriented to person, place, and time.     Sensory: Sensory deficit present.     Motor: Motor function is intact.  Psychiatric:        Mood and Affect: Mood normal.        Behavior: Behavior normal.      Assessment/Plan Mr. Platt will be admitted for further rehabilitation with therapies and discharge planning that takes into account his current mobility limitations.    Floreen Comber, NP 10/26/2022, 8:14 AM

## 2022-10-25 NOTE — ED Notes (Signed)
Unable to complete vitals and triage assessment due to provider being in room and not wanting to be interrupted.

## 2022-10-25 NOTE — ED Triage Notes (Signed)
Pt BIB [facility] from home with c/o abdominal pain since Thursday and decreased sensation in BLE. S/p back surgery 10/15/22.Pt ambulatory with walker. Last BM, Thursday.

## 2022-10-26 DIAGNOSIS — M4714 Other spondylosis with myelopathy, thoracic region: Secondary | ICD-10-CM | POA: Diagnosis not present

## 2022-10-26 DIAGNOSIS — K592 Neurogenic bowel, not elsewhere classified: Secondary | ICD-10-CM | POA: Diagnosis not present

## 2022-10-26 DIAGNOSIS — N319 Neuromuscular dysfunction of bladder, unspecified: Secondary | ICD-10-CM | POA: Diagnosis not present

## 2022-10-26 LAB — URINALYSIS, ROUTINE W REFLEX MICROSCOPIC
Bilirubin Urine: NEGATIVE
Glucose, UA: 50 mg/dL — AB
Hgb urine dipstick: NEGATIVE
Ketones, ur: NEGATIVE mg/dL
Leukocytes,Ua: NEGATIVE
Nitrite: NEGATIVE
Protein, ur: NEGATIVE mg/dL
Specific Gravity, Urine: 1.039 — ABNORMAL HIGH (ref 1.005–1.030)
pH: 7 (ref 5.0–8.0)

## 2022-10-26 LAB — GLUCOSE, CAPILLARY: Glucose-Capillary: 141 mg/dL — ABNORMAL HIGH (ref 70–99)

## 2022-10-26 MED ORDER — ZOLPIDEM TARTRATE 5 MG PO TABS
5.0000 mg | ORAL_TABLET | Freq: Every evening | ORAL | Status: DC | PRN
  Administered 2022-10-26 – 2022-10-28 (×4): 5 mg via ORAL
  Filled 2022-10-26 (×4): qty 1

## 2022-10-26 MED ORDER — PANTOPRAZOLE SODIUM 40 MG PO TBEC
40.0000 mg | DELAYED_RELEASE_TABLET | Freq: Every day | ORAL | Status: DC
  Administered 2022-10-26 – 2022-10-29 (×4): 40 mg via ORAL
  Filled 2022-10-26 (×4): qty 1

## 2022-10-26 NOTE — Progress Notes (Addendum)
Inpatient Rehab Coordinator Note:  I met with patient at bedside to discuss CIR recommendations and goals/expectations of CIR stay.  We reviewed 3 hrs/day of therapy, physician follow up, and average length of stay 2 weeks (dependent upon progress) with goals of mod I.  Pt reports that he just wasn't not doing well at home, needing more help than he anticipated.  His wife is recovering from a mastectomy, and his son is not able to provide much physical assist either.  He also mentioned some "discombobulation" and "fuzziheadedness" post op that makes him feel more confused and off balance.  We discussed Dr. Riley Kill to see in formal consult and need for insurance authorization.  Pt states he has [facility] through [facility] and [employer].  We will try with [facility] first.    Estill Dooms, PT, DPT Admissions Coordinator [phone] 10/26/22  12:59 PM

## 2022-10-26 NOTE — Consult Note (Addendum)
Physical Medicine and Rehabilitation Consult Reason for Consult: Thoracic myelopathy Referring Physician: Pool   HPI: Mitchell Morgan is a 77 y.o. male with a history of hypertension who recently had a T10-T11 decompressive laminectomy for thoracic stenosis and myelopathy by Dr. Lelon Perla on 10/15/2022.  Initially inpatient rehab was recommended and planned but authorization never was received for such admission.  Patient ultimately decided to go home on 10/22/2022.  At home however, he struggled with his mobility as well as bowel and bladder control.  In addition to weakness his legs he has had sensory loss and paresthesias in both of his legs and he presented to the emergency department on 10/25/2022.  CT of the abdomen and pelvis showed signs of peptic ulcer disease.  No signs of surgical wound infection or complication were noted..  Patient appears to be voiding fairly well but has poor sense of his bowels.  He perhaps can feel a slight bit of anorectal contraction.  He has been up again with physical therapy and is min assist 300 feet with a rolling walker but is very ataxic and slow at times making him a significant fall risk.  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   Review of Systems  Constitutional:  Negative for fever.  HENT: Negative.    Eyes: Negative.   Respiratory: Negative.    Cardiovascular: Negative.   Gastrointestinal:  Positive for constipation.  Genitourinary:        Impaired sense of bladder  Musculoskeletal:  Positive for back pain.  Skin:  Negative for rash.  Neurological:  Positive for sensory change, focal weakness and weakness. Negative for dizziness.  Psychiatric/Behavioral:  Negative for substance abuse.    Past Medical History:  Diagnosis Date   GERD (gastroesophageal reflux disease)    High cholesterol    Hypertension    Past Surgical History:  Procedure Laterality Date    DECOMPRESSIVE LUMBAR LAMINECTOMY LEVEL 1 N/A 10/15/2022   Procedure: DECOMPRESSIVE thoracic laminectomy THORACIC TEN -ELEVEN;  Surgeon: Julio Sicks, MD;  Location: [facility] OR;  Service: Neurosurgery;  Laterality: N/A;   History reviewed. No pertinent family history. Social History:  reports that he has never smoked. He has never used smokeless tobacco. He reports that he does not drink alcohol and does not use drugs. Allergies:  Allergies  Allergen Reactions   Iodine Itching and Swelling    Red Eyes, Watery    Pravastatin Other (See Comments)    myalgia   Shrimp [Shellfish Allergy] Itching and Swelling    Lobster, crab  Swollen eyes   Simvastatin Other (See Comments)    myalgia   Medications Prior to Admission  Medication Sig Dispense Refill   acetaminophen (TYLENOL) 500 MG tablet Take 500 mg by mouth in the morning.     amLODipine (NORVASC) 10 MG tablet Take 10 mg by mouth daily.     aspirin EC (ASPIRIN 81) 81 MG tablet Take 81 mg by mouth daily.     atenolol (TENORMIN) 50 MG tablet Take 50 mg by mouth daily.     atorvastatin (LIPITOR) 20 MG tablet Take 20 mg by mouth daily.     Cholecalciferol 50 MCG (2000 UT) TABS Take 1 tablet by mouth daily.     cyclobenzaprine (FLEXERIL) 10 MG tablet Take 1 tablet (10 mg total) by mouth 3 (three) times daily as needed for muscle spasms. (  Patient taking differently: Take 5 mg by mouth daily as needed for muscle spasms.) 30 tablet 0   diclofenac Sodium (VOLTAREN) 1 % GEL Apply 2 g topically in the morning, at noon, and at bedtime.     HYDROcodone-acetaminophen (NORCO) 10-325 MG tablet Take 1-2 tablets by mouth every 4 (four) hours as needed for severe pain ((score 7 to 10)). 12 tablet 0   pantoprazole (PROTONIX) 20 MG tablet Take 20 mg by mouth daily.     polyethylene glycol powder (GLYCOLAX/MIRALAX) 17 GM/SCOOP powder Take 1 capful (17 g) by mouth daily. 238 g 0   PROLIA 60 MG/ML SOSY injection Inject 60 mg into the skin every 6 (six) months.      senna-docusate (SENOKOT-S) 8.6-50 MG tablet Take 2 tablets by mouth 2 (two) times daily.     zolpidem (AMBIEN) 10 MG tablet Take 10 mg by mouth at bedtime.     cyanocobalamin 1000 MCG tablet Take 1 tablet (1,000 mcg total) by mouth daily. 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 Built-in shower seat in mother-in-law apartment in son's house) Additional Comments: Pt spends part of time living in a mother-in-law apartment the family built within pt's son's house in [place]. Son's house/mother-in-law apartment is one level with one step in from garage. Mother-in-law apartment has walk-in shower with built-in seat and comfort height toilet and is accessible with a RW.  Lives With: Spouse  Functional History: Prior Function Prior Level of Function : Independent/Modified Independent Mobility Comments: Previously independent; started using SPC started 4/30 about 3 days later started using standard walker. Since d/c recently using walker at all times, feels off balance. ADLs Comments: Previously independent and was driving. Pt reports he begain needing Supervision to Min guard for shower transfers and LB ADLs after 09/15/22. Functional Status:  Mobility: Bed  Mobility Overal bed mobility: Needs Assistance Bed Mobility: Rolling, Sidelying to Sit Rolling: Supervision Sidelying to sit: Min guard General bed mobility comments: Supervision to roll, good control, min guard to rise to EOB with cues for precautions as he tried to sit up with flexion initially. Transfers Overall transfer level: Needs assistance Equipment used: Rolling walker (2 wheels) Transfers: Sit to/from Stand Sit to Stand: Min assist General transfer comment: Practiced sit<>stand from bed and recliner. 2/3 performed at min guard, however required min assist on one occasion due to posterior LOB. Required cues to maintain precautions and avoid twisting when proceeding to sit. Ambulation/Gait Ambulation/Gait assistance: Min assist, Min guard Gait Distance (Feet): 300 Feet (15 feet without AD) Assistive device: Rolling walker (2 wheels), 1 person hand held assist Gait Pattern/deviations: Step-through pattern, Decreased step length - right, Decreased step length - left, Decreased stride length, Ataxic General Gait Details: Ataxic pattern, RLE with less control than LLE. With AD pt min guard 300 feet, demonstrates reduced control and  minor knee instability towards end of distance, noticable fatigue reported. Min assist without AD short distance in room, pt unstable with increased fall risk. Educated on sequencing, safety, and awareness. RW use at all times and within proximity. Gait velocity: decreased Gait velocity interpretation: <1.31 ft/sec, indicative of household ambulator Stairs: Yes Stairs assistance: Min assist Stair Management: One rail Right, One rail Left, Step to pattern, Forwards Number of Stairs: 2 General stair comments: Practiced stair navigation with min guard to accend, and min assist to descend showing reduced motor control in quad during eccentric phase of descent. Cues for technique, sequencing, and rail use - uses door frame at home.    ADL:     Cognition: Cognition Overall Cognitive Status: Within Functional Limits for tasks assessed Orientation Level: Oriented X4 Cognition Arousal/Alertness: Awake/alert Behavior During Therapy: WFL for tasks assessed/performed Overall Cognitive Status: Within Functional Limits for tasks assessed  Blood pressure (!) 155/83, pulse 65, temperature 98.5 F (36.9 C), temperature source Oral, resp. rate 18, SpO2 96 %. Physical Exam Constitutional:      General: He is not in acute distress. HENT:     Head: Normocephalic.     Nose: Nose normal.     Mouth/Throat:     Mouth: Mucous membranes are moist.  Eyes:     Extraocular Movements: Extraocular movements intact.  Cardiovascular:     Rate and Rhythm: Normal rate.  Pulmonary:     Effort: Pulmonary effort is normal.  Abdominal:     General: Abdomen is flat.  Musculoskeletal:        General: No swelling.     Comments: Mid-low back TTP. Left shoulder limited in abduction and ER/IR  Skin:    Comments: Back incision clean  Neurological:     Mental Status: He is alert.     Comments: Alert and oriented x 3. Normal insight and awareness. Intact Memory. Normal language and speech. Cranial nerve exam unremarkable. MMT: BUE 5/5. RLE 3+HF, KE and 4/5 ADF/PF. LLE 4- HF, KE and 4/5 ADF/PF. T11 sensory level but can sense gross touch. Has difficulty with HTS coordination bilaterally.  DTR's absent in LE. No resting tone.   Psychiatric:        Mood and Affect: Mood normal.        Behavior: Behavior normal.     Results for orders placed or performed during the hospital encounter of 10/25/22 (from the past 24 hour(s))  Comprehensive metabolic panel     Status: Abnormal   Collection Time: 10/25/22  9:25 PM  Result Value Ref Range   Sodium 128 (L) 135 - 145 mmol/L   Potassium 4.9 3.5 - 5.1 mmol/L   Chloride 92 (L) 98 - 111 mmol/L   CO2 23 22 - 32 mmol/L   Glucose, Bld 148 (H) 70 - 99 mg/dL   BUN 19 8 - 23 mg/dL   Creatinine, Ser 1.61 0.61 - 1.24  mg/dL   Calcium 9.1 8.9 - 09.6 mg/dL   Total Protein 5.8 (L) 6.5 - 8.1 g/dL   Albumin 3.1 (L) 3.5 - 5.0 g/dL   AST 27 15 - 41 U/L   ALT 46 (H) 0 - 44 U/L   Alkaline Phosphatase 50 38 - 126 U/L   Total Bilirubin 1.3 (H) 0.3 - 1.2 mg/dL   GFR, Estimated >04 >54 mL/min   Anion gap 13 5 - 15  Lipase, blood     Status: None   Collection Time: 10/25/22  9:25 PM  Result Value Ref  Range   Lipase 36 11 - 51 U/L  CBC with Diff     Status: Abnormal   Collection Time: 10/25/22  9:25 PM  Result Value Ref Range   WBC 13.0 (H) 4.0 - 10.5 K/uL   RBC 4.86 4.22 - 5.81 MIL/uL   Hemoglobin 15.8 13.0 - 17.0 g/dL   HCT 16.1 09.6 - 04.5 %   MCV 93.2 80.0 - 100.0 fL   MCH 32.5 26.0 - 34.0 pg   MCHC 34.9 30.0 - 36.0 g/dL   RDW 40.9 81.1 - 91.4 %   Platelets 237 150 - 400 K/uL   nRBC 0.0 0.0 - 0.2 %   Neutrophils Relative % 88 %   Neutro Abs 11.4 (H) 1.7 - 7.7 K/uL   Lymphocytes Relative 7 %   Lymphs Abs 0.9 0.7 - 4.0 K/uL   Monocytes Relative 4 %   Monocytes Absolute 0.6 0.1 - 1.0 K/uL   Eosinophils Relative 0 %   Eosinophils Absolute 0.0 0.0 - 0.5 K/uL   Basophils Relative 0 %   Basophils Absolute 0.0 0.0 - 0.1 K/uL   Immature Granulocytes 1 %   Abs Immature Granulocytes 0.09 (H) 0.00 - 0.07 K/uL  Urinalysis, Routine w reflex microscopic -Urine, Clean Catch     Status: Abnormal   Collection Time: 10/26/22 12:34 AM  Result Value Ref Range   Color, Urine YELLOW YELLOW   APPearance CLEAR CLEAR   Specific Gravity, Urine 1.039 (H) 1.005 - 1.030   pH 7.0 5.0 - 8.0   Glucose, UA 50 (A) NEGATIVE mg/dL   Hgb urine dipstick NEGATIVE NEGATIVE   Bilirubin Urine NEGATIVE NEGATIVE   Ketones, ur NEGATIVE NEGATIVE mg/dL   Protein, ur NEGATIVE NEGATIVE mg/dL   Nitrite NEGATIVE NEGATIVE   Leukocytes,Ua NEGATIVE NEGATIVE   CT ABDOMEN PELVIS W CONTRAST  Result Date: 10/25/2022 CLINICAL DATA:  Bowel obstruction suspected Recent back surgery. EXAM: CT ABDOMEN AND PELVIS WITH CONTRAST TECHNIQUE:  Multidetector CT imaging of the abdomen and pelvis was performed using the standard protocol following bolus administration of intravenous contrast. RADIATION DOSE REDUCTION: This exam was performed according to the departmental dose-optimization program which includes automated exposure control, adjustment of the mA and/or kV according to patient size and/or use of iterative reconstruction technique. CONTRAST:  75mL OMNIPAQUE IOHEXOL 350 MG/ML SOLN COMPARISON:  CT 08/14/2017 FINDINGS: Lower chest: Upper normal heart size with coronary artery calcifications. No pleural fluid or basilar airspace disease. Hepatobiliary: No focal liver abnormality is seen. No gallstones, gallbladder wall thickening, or biliary dilatation. Pancreas: Unremarkable. No pancreatic ductal dilatation or surrounding inflammatory changes. Spleen: Small in size. No focal abnormality. Adrenals/Urinary Tract: Normal adrenal glands. No hydronephrosis or perinephric edema. Homogeneous renal enhancement with symmetric excretion on delayed phase imaging. Simple left renal cyst, needs no further imaging follow-up. Urinary bladder is physiologically distended without wall thickening. Stomach/Bowel: There is moderate wall thickening about the duodenum, series 3, image 33 with adjacent peri duodenal edema and stranding. Air and fluid distention of the stomach. No definite gastric wall thickening. Occasional fluid-filled proximal small bowel but no abnormal distension or small bowel obstruction. Normal appendix. Moderate diffuse colonic stool burden. Distal descending and sigmoid colonic diverticulosis without diverticulitis. Vascular/Lymphatic: Aortic atherosclerosis. No aneurysm. Patent portal vein. No abdominopelvic adenopathy. Reproductive: Enlarged prostate spans 6.4 cm transverse. Other: No free air or focal fluid collection. No ascites. Musculoskeletal: Recent T10-T11 decompression, no definite postoperative fluid collection. Diffuse lumbar  degenerative change. Vertebral body hemangioma within L1. IMPRESSION: 1. Moderate wall  thickening about the duodenum with adjacent peri duodenal edema and stranding, suspicious for duodenitis or peptic ulcer disease. No perforation or abscess. 2. Mild gastric distension likely related to duodenal inflammation. No bowel obstruction. 3. Colonic diverticulosis without diverticulitis. 4. Enlarged prostate. 5. Recent T10-T11 decompression, no evident postoperative fluid collection. Aortic Atherosclerosis (ICD10-I70.0). Electronically Signed   By: Narda Rutherford M.D.   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  Patient with residual lower extremity weakness and sensory loss as well as poor control of bowel function. Does the need for close, 24 hr/day medical supervision in concert with the patient's rehab needs make it unreasonable for this patient to be served in a less intensive setting? Yes Co-Morbidities requiring supervision/potential complications:  -Pain management -bowel mgt/program -bladder mgt/program -hx of left RTC injury Due to bladder management, bowel management, safety, skin/wound care, medication administration, pain management, and patient education, does the patient require 24 hr/day rehab nursing? Yes Does the patient require coordinated care of a physician, rehab nurse, therapy disciplines of PT, OT to address physical and functional deficits in the context of the above medical diagnosis(es)? Yes Addressing deficits in the following areas: balance, endurance, locomotion, strength, transferring, bowel/bladder control, bathing, dressing, grooming, toileting, and psychosocial support Can the patient actively participate in an intensive therapy program of at least 3 hrs of therapy per day at least 5 days per week? Yes The potential for patient to make measurable gains while on inpatient rehab is  excellent Anticipated functional outcomes upon discharge from inpatient rehab are modified independent  with PT, modified independent with OT, n/a with SLP. Estimated rehab length of stay to reach the above functional goals is: 7-10 days Anticipated discharge destination: Home Overall Rehab/Functional Prognosis: excellent  POST ACUTE RECOMMENDATIONS: This patient's condition is appropriate for continued rehabilitative care in the following setting: CIR Patient has agreed to participate in recommended program. Yes Note that insurance prior authorization may be required for reimbursement for recommended care.  Comment: Despite ambulating 300 ft with therapy, Mr. Rufus has continued, significant proprioceptive sensory loss in his lower extremities which puts him at high risk for falls and injury. Furthermore, he has a chronic left rotator cuff injury which prevents him from fully utilizing his left upper extremity for balance. He also is still dealing with neurogenic bowel and hasn't had a bm since he went home. He has saddle anaesthesia and cannot sense the need to empty nor control his BM when he is emptying. While his bladder is better off than his bowels, he cannot completely discern when his bladder is full.    MEDICAL RECOMMENDATIONS: For bowel clean-out, I recommend sorbitol 60 cc now followed by a soaps sud enema in 4-5 hours if no bm  Once his bowels empty, a bowel program should be initiated each morning or evening. This is best set up on inpatient rehab. The program could consist of a scheduled suppository at desired time of program.  Along with the daily suppository, daily senokot-s or  miralax can be scheduled about 12 hours prior to program time. Begin timed voids q3 hours while awake. Scan pt for PVR's and cath for volume greater than 350cc.     I have personally performed a face to face diagnostic evaluation of this patient. Additionally, I have examined the patient's medical  record including any pertinent labs and radiographic images. If the physician assistant has documented in this note, I have reviewed and edited  or otherwise concur with the physician assistant's documentation.  Thanks,  Ranelle Oyster, MD 10/26/2022

## 2022-10-26 NOTE — PMR Pre-admission (Shared)
PMR Admission Coordinator Pre-Admission Assessment  Patient: Mitchell Morgan is an 77 y.o., male MRN: [id number] DOB: 1945-11-04 Height:   Weight:                Insurance Information HMO: ***    PPO: ***     PCP:      IPA:      80/20:      OTHER:  PRIMARY: [facility]      Policy#: ***      Subscriber: *** CM Name: ***      Phone#: ***     Fax#: *** Pre-Cert#: ***      Employer: *** Benefits:  Phone #: ***     Name: *** Dolores Hoose. Date: ***     Deduct: ***      Out of Pocket Max: ***      Life Max: ***  CIR: ***      SNF: *** Outpatient: ***     Co-Pay: *** Home Health: ***      Co-Pay: *** DME: ***     Co-Pay: *** Providers:  SECONDARY: [employer] Medicare      Policy#: ***      Phone#: ***  Financial Counselor:       Phone#:   The "Data Collection Information Summary" for patients in Inpatient Rehabilitation Facilities with attached "Privacy Act Statement-Health Care Records" was provided and verbally reviewed with: Patient  Emergency Contact Information Contact Information     Name Relation Home Work Mobile   Chestnutt,Judy Spouse [phone]        Current Medical History  Patient Admitting Diagnosis: thoracic myelopathy   History of Present Illness: Pt is a 77 y/o male with PMH of HTN and recent T10-T11 decompression laminectomy per Dr. Jordan Likes on 5/30, who discharged home and readmitted to [facility] on 6/10 with complaints of abdominal pain and difficulty walking.  On original acute stay, CIR was attempted but insurance authorization was not received and pt ultimately decided to d/c home on 6/6 where he struggled with mobility and bowel/bladder control.  In addition to weakness in BLEs he has sensory loss and paraesthesias.  CT showed signs of peptic ulcer disease.  No signs of surgical wound infection noted.  Patient is having saddle anaesthesia difficulty with bowel>bladder.  He is voiding every 90 minutes to insure no incontinent episodes, and has poor sense of fullness in bowel and poor  coordination for effective evacuation.  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  Therapy strongly recommending CIR prior to return home.       Patient's medical record from [facility] has been reviewed by the rehabilitation admission coordinator and physician.  Past Medical History  Past Medical History:  Diagnosis Date   GERD (gastroesophageal reflux disease)    High cholesterol    Hypertension     Has the patient had major surgery during 100 days prior to admission? Yes  Family History  family history is not on file.   Current Medications   Current Facility-Administered Medications:    acetaminophen (TYLENOL) tablet 650 mg, 650 mg, Oral, Q6H PRN **OR** acetaminophen (TYLENOL) suppository 650 mg, 650 mg, Rectal, Q6H PRN, Bergman, Meghan D, NP   acetaminophen (TYLENOL) tablet 500 mg, 500 mg, Oral, q AM, Bergman, Meghan D, NP, 500 mg at 10/26/22 0852   amLODipine (NORVASC) tablet 10 mg, 10 mg, Oral, Daily, Bergman, Meghan D, NP,  10 mg at 10/26/22 8416   aspirin EC tablet 81 mg, 81 mg, Oral, Daily, Bergman, Meghan D, NP, 81 mg at 10/26/22 0852   atenolol (TENORMIN) tablet 50 mg, 50 mg, Oral, Daily, Bergman, Meghan D, NP, 50 mg at 10/26/22 6063   atorvastatin (LIPITOR) tablet 20 mg, 20 mg, Oral, Daily, Bergman, Meghan D, NP, 20 mg at 10/26/22 0853   bisacodyl (DULCOLAX) suppository 10 mg, 10 mg, Rectal, Daily PRN, Doran Durand, Meghan D, NP   cholecalciferol (VITAMIN D3) 25 MCG (1000 UNIT) tablet 2,000 Units, 2,000 Units, Oral, Daily, Bergman, Meghan D, NP, 2,000 Units at 10/26/22 0160   cyanocobalamin (VITAMIN B12) tablet 1,000 mcg, 1,000 mcg, Oral, Daily, Bergman, Meghan D, NP, 1,000 mcg at 10/26/22 0853   cyclobenzaprine (FLEXERIL) tablet 10 mg, 10 mg, Oral, TID PRN, Val Eagle D, NP, 10 mg at 10/26/22 0507   hydrALAZINE (APRESOLINE) injection 5 mg, 5 mg, Intravenous, Q6H PRN,  Bergman, Meghan D, NP   HYDROcodone-acetaminophen (NORCO) 10-325 MG per tablet 1 tablet, 1 tablet, Oral, Q4H PRN, Val Eagle D, NP, 1 tablet at 10/26/22 0213   ondansetron (ZOFRAN) tablet 4 mg, 4 mg, Oral, Q6H PRN **OR** ondansetron (ZOFRAN) injection 4 mg, 4 mg, Intravenous, Q6H PRN, Bergman, Meghan D, NP   pantoprazole (PROTONIX) EC tablet 40 mg, 40 mg, Oral, Daily, Bergman, Meghan D, NP, 40 mg at 10/26/22 0852   polyethylene glycol (MIRALAX / GLYCOLAX) packet 17 g, 17 g, Oral, Daily, Bergman, Meghan D, NP, 17 g at 10/26/22 1093   senna-docusate (Senokot-S) tablet 2 tablet, 2 tablet, Oral, BID, Bergman, Meghan D, NP, 2 tablet at 10/26/22 0851   sodium phosphate (FLEET) 7-19 GM/118ML enema 1 enema, 1 enema, Rectal, Once PRN, Bergman, Meghan D, NP   zolpidem (AMBIEN) tablet 5 mg, 5 mg, Oral, QHS PRN, Julio Sicks, MD, 5 mg at 10/26/22 0213  Patients Current Diet:  Diet Order             Diet Heart Room service appropriate? Yes; Fluid consistency: Thin  Diet effective now                   Precautions / Restrictions Precautions Precautions: Fall, Back Precaution Booklet Issued: Yes (comment) Precaution Comments: pt able to state 3/3; required no cues to adhere Restrictions Weight Bearing Restrictions: No   Has the patient had 2 or more falls or a fall with injury in the past year?No  Prior Activity Level Community (5-7x/wk): indep prior to admit, no DME, driving, hoping to get back into working  Prior Functional Level Prior Function Prior Level of Function : Independent/Modified Independent Mobility Comments: Previously independent; started using SPC started 4/30 about 3 days later started using standard walker. Since d/c recently using walker at all times, feels off balance. ADLs Comments: Previously independent and was driving. Pt reports he begain needing Supervision to Min guard for shower transfers and assist with LB ADLs after 09/15/22.  Self Care: Did the patient  need help bathing, dressing, using the toilet or eating?  Independent  Indoor Mobility: Did the patient need assistance with walking from room to room (with or without device)? Independent  Stairs: Did the patient need assistance with internal or external stairs (with or without device)? Independent  Functional Cognition: Did the patient need help planning regular tasks such as shopping or remembering to take medications? Independent  Patient Information Are you of Hispanic, Latino/a,or Spanish origin?: A. No, not of Hispanic, Latino/a, or Spanish origin What is your race?:  A. White Do you need or want an interpreter to communicate with a doctor or health care staff?: 0. No  Patient's Response To:  Health Literacy and Transportation Is the patient able to respond to health literacy and transportation needs?: Yes Health Literacy - How often do you need to have someone help you when you read instructions, pamphlets, or other written material from your doctor or pharmacy?: Never In the past 12 months, has lack of transportation kept you from medical appointments or from getting medications?: No In the past 12 months, has lack of transportation kept you from meetings, work, or from getting things needed for daily living?: No  Journalist, newspaper / Equipment Home Assistive Devices/Equipment: Eyeglasses, Bedside commode/3-in-1, Environmental consultant (specify type) Home Equipment: Standard Ephraim Hamburger - single point, Other (comment)  Prior Device Use: Indicate devices/aids used by the patient prior to current illness, exacerbation or injury? Walker  Current Functional Level Cognition  Overall Cognitive Status: Within Functional Limits for tasks assessed Orientation Level: Oriented X4    Extremity Assessment (includes Sensation/Coordination)  Upper Extremity Assessment: Overall WFL for tasks assessed  Lower Extremity Assessment: Generalized weakness RLE Deficits / Details: 4/5  hip flex & knee ext,  knee flex 3+/5, DF/PF 4-/5 RLE Sensation: decreased proprioception, decreased light touch LLE Deficits / Details: 4/5 hip flex & knee ext, knee flex 3+/5, DF/PF 4-/5 LLE Sensation: decreased proprioception, decreased light touch    ADLs  Overall ADL's : Needs assistance/impaired Eating/Feeding: Independent, Sitting Grooming: Standing, Min guard Grooming Details (indicate cue type and reason): with UE supported Upper Body Bathing: Sitting, Set up, Supervision/ safety Lower Body Bathing: Moderate assistance, Sitting/lateral leans Upper Body Dressing : Sitting, Independent Lower Body Dressing: Sitting/lateral leans, Maximal assistance Toilet Transfer: Ambulation, Rolling walker (2 wheels), Min guard Toileting- Clothing Manipulation and Hygiene: Min guard, Sit to/from stand Tub/ Engineer, structural: Min guard Functional mobility during ADLs: Min guard, Rolling walker (2 wheels)    Mobility  Overal bed mobility: Needs Assistance Bed Mobility: Rolling, Sidelying to Sit Rolling: Supervision Sidelying to sit: Min guard General bed mobility comments: Supervision to roll, good control, min guard to rise to EOB with cues for precautions as he tried to sit up with flexion initially.    Transfers  Overall transfer level: Needs assistance Equipment used: Rolling walker (2 wheels) Transfers: Sit to/from Stand Sit to Stand: Min assist General transfer comment: Practiced sit<>stand from bed and recliner. 2/3 performed at min guard, however required min assist on one occasion due to posterior LOB. Required cues to maintain precautions and avoid twisting when proceeding to sit.    Ambulation / Gait / Stairs / Wheelchair Mobility  Ambulation/Gait Ambulation/Gait assistance: Min assist, Land (Feet): 300 Feet (15 feet without AD) Assistive device: Rolling walker (2 wheels), 1 person hand held assist Gait Pattern/deviations: Step-through pattern, Decreased step length - right, Decreased  step length - left, Decreased stride length, Ataxic General Gait Details: Ataxic pattern, RLE with less control than LLE. With AD pt min guard 300 feet, demonstrates reduced control and minor knee instability towards end of distance, noticable fatigue reported. Min assist without AD short distance in room, pt unstable with increased fall risk. Educated on sequencing, safety, and awareness. RW use at all times and within proximity. Gait velocity: decreased Gait velocity interpretation: <1.31 ft/sec, indicative of household ambulator Stairs: Yes Stairs assistance: Min assist Stair Management: One rail Right, One rail Left, Step to pattern, Forwards Number of Stairs: 2 General stair comments: Practiced  stair navigation with min guard to accend, and min assist to descend showing reduced motor control in quad during eccentric phase of descent. Cues for technique, sequencing, and rail use - uses door frame at home.    Posture / Balance Dynamic Sitting Balance Sitting balance - Comments: Pt able to sit EOB without UE support. Balance Overall balance assessment: Needs assistance Sitting-balance support: Feet supported Sitting balance-Leahy Scale: Good Sitting balance - Comments: Pt able to sit EOB without UE support. Standing balance support: During functional activity, No upper extremity supported Standing balance-Leahy Scale: Fair Standing balance comment: can stand without UE support; needs support for dynamic tasks    Special needs/care consideration Special service needs bowel/bladder program     Previous Home Environment (from acute therapy documentation) Living Arrangements: Spouse/significant other  Lives With: Spouse Available Help at Discharge: Family, Available 24 hours/day Type of Home: House (condo in [facility]) Home Layout: One level Home Access: Stairs to enter Advertising account executive to [employer]) Entrance Stairs-Rails: None Secretary/administrator of Steps: 1 Bathroom Shower/Tub: Architectural technologist: Handicapped height Bathroom Accessibility: Yes Home Care Services: No Additional Comments: Pt spends part of time living in a mother-in-law apartment the family built within pt's son's house in [place]. Son's house/mother-in-law apartment is one level with one step in from garage. Mother-in-law apartment has walk-in shower with built-in seat and comfort height toilet and is accessible with a RW.  Discharge Living Setting Plans for Discharge Living Setting: Patient's home Type of Home at Discharge: Other (Comment) (condo in [facility]) Discharge Home Layout: One level Discharge Home Access: Level entry Entrance Stairs-Number of Steps: curb step from parking lot Discharge Bathroom Shower/Tub: Walk-in shower Discharge Bathroom Toilet: Handicapped height Discharge Bathroom Accessibility: Yes How Accessible: Accessible via wheelchair Does the patient have any problems obtaining your medications?: No  Social/Family/Support Systems Patient Roles: Spouse Anticipated Caregiver: Vue Pavon (spouse) Anticipated Caregiver's Contact Information: [phone] Ability/Limitations of Caregiver: 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 Pt/Family Agrees to Admission and willing to participate: Yes Program Orientation Provided & Reviewed with Pt/Caregiver Including Roles  & Responsibilities: Yes  Barriers to Discharge: Decreased caregiver support, Neurogenic Bowel & Bladder   Decrease burden of Care through IP rehab admission: n/a   Possible  need for SNF placement upon discharge:Not anticipated.  Pt with high PLOF and motivated to return.  Discharge plan return home with spouse and son to provide supervision.    Patient Condition: This patient's condition remains as documented in the consult dated 10/26/22, in which the Rehabilitation Physician determined and documented that the patient's condition is appropriate for intensive rehabilitative care in an inpatient rehabilitation facility. Will admit to inpatient rehab pending insurance approval***.  Preadmission Screen Completed By:  Stephania Fragmin, PT, 10/26/2022 3:19 PM ______________________________________________________________________   Discussed status with Dr. Marland Kitchenon***at *** and received approval for admission today.  Admission Coordinator:  Stephania Fragmin, time***/Date***

## 2022-10-26 NOTE — ED Notes (Addendum)
ED TO INPATIENT HANDOFF REPORT  ED Nurse Name and Phone #: Juliette Alcide RN [phone]  S Name/Age/Gender Mitchell Morgan 77 y.o. male Room/Bed: 034C/034C  Code Status   Code Status: Full Code  Home/SNF/Other Home Patient oriented to: self, place, time, and situation Is this baseline? 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 S/p back surgery 10/15/22.Pt ambulatory with walker. Last BM, Thursday.   Allergies Allergies  Allergen Reactions   Iodine Itching and Swelling    Red Eyes, Watery    Pravastatin Other (See Comments)    myalgia   Shrimp [Shellfish Allergy] Itching and Swelling    Lobster, crab  Swollen eyes   Simvastatin Other (See Comments)    myalgia    Level of Care/Admitting Diagnosis ED Disposition     ED Disposition  Admit   Condition  --   Comment  Hospital Area: [facility] [[id number]]  Level of Care: Med-Surg [16]  May admit patient to [facility] or [facility] if equivalent level of care is available:: No  Covid Evaluation: Asymptomatic - no recent exposure (last 10 days) testing not required  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]  Admitting Physician: Julio Sicks [phone]  Attending Physician: Julio Sicks [phone]  Certification:: I certify this patient will need inpatient services for at least 2 midnights  Estimated Length of Stay: 3          B Medical/Surgery History Past Medical History:  Diagnosis Date   GERD (gastroesophageal reflux disease)    High cholesterol    Hypertension    Past Surgical History:  Procedure Laterality Date   DECOMPRESSIVE LUMBAR LAMINECTOMY LEVEL 1 N/A 10/15/2022   Procedure: DECOMPRESSIVE thoracic laminectomy THORACIC TEN -ELEVEN;  Surgeon: Julio Sicks, MD;  Location: [facility] OR;  Service: Neurosurgery;  Laterality: N/A;     A IV  Location/Drains/Wounds Patient Lines/Drains/Airways Status     Active Line/Drains/Airways     Name Placement date Placement time Site Days   Peripheral IV 10/25/22 20 G 1" Right Forearm 10/25/22  2124  Forearm  1            Intake/Output Last 24 hours  Intake/Output Summary (Last 24 hours) at 10/26/2022 0125 Last data filed at 10/25/2022 2357 Gross per 24 hour  Intake 255.69 ml  Output --  Net 255.69 ml    Labs/Imaging Results for orders placed or performed during the hospital encounter of 10/25/22 (from the past 48 hour(s))  Comprehensive metabolic panel     Status: Abnormal   Collection Time: 10/25/22  9:25 PM  Result Value Ref Range   Sodium 128 (L) 135 - 145 mmol/L   Potassium 4.9 3.5 - 5.1 mmol/L   Chloride 92 (L) 98 - 111 mmol/L   CO2 23 22 - 32 mmol/L   Glucose, Bld 148 (H) 70 - 99 mg/dL    Comment: Glucose reference range applies only to samples taken after fasting for at least 8 hours.   BUN 19 8 - 23 mg/dL   Creatinine, Ser 9.56 0.61 - 1.24 mg/dL   Calcium 9.1 8.9 - 21.3 mg/dL   Total Protein 5.8 (L) 6.5 - 8.1 g/dL   Albumin 3.1 (L) 3.5 - 5.0 g/dL   AST 27 15 - 41 U/L   ALT 46 (H) 0 - 44 U/L   Alkaline Phosphatase 50 38 - 126 U/L  Total Bilirubin 1.3 (H) 0.3 - 1.2 mg/dL   GFR, Estimated >16 >10 mL/min    Comment: (NOTE) Calculated using the CKD-EPI Creatinine Equation (2021)    Anion gap 13 5 - 15    Comment: Performed at [facility], [address] [place], [place] [address]  Lipase, blood     Status: None   Collection Time: 10/25/22  9:25 PM  Result Value Ref Range   Lipase 36 11 - 51 U/L    Comment: Performed at [facility], [address] [place], [place] [address]  CBC with Diff     Status: Abnormal   Collection Time: 10/25/22  9:25 PM  Result Value Ref Range   WBC 13.0 (H) 4.0 - 10.5 K/uL   RBC 4.86 4.22 - 5.81 MIL/uL   Hemoglobin 15.8 13.0 - 17.0 g/dL   HCT 19.1 47.8 - 29.5 %   MCV 93.2 80.0 - 100.0 fL   MCH 32.5 26.0 -  34.0 pg   MCHC 34.9 30.0 - 36.0 g/dL   RDW 62.1 30.8 - 65.7 %   Platelets 237 150 - 400 K/uL   nRBC 0.0 0.0 - 0.2 %   Neutrophils Relative % 88 %   Neutro Abs 11.4 (H) 1.7 - 7.7 K/uL   Lymphocytes Relative 7 %   Lymphs Abs 0.9 0.7 - 4.0 K/uL   Monocytes Relative 4 %   Monocytes Absolute 0.6 0.1 - 1.0 K/uL   Eosinophils Relative 0 %   Eosinophils Absolute 0.0 0.0 - 0.5 K/uL   Basophils Relative 0 %   Basophils Absolute 0.0 0.0 - 0.1 K/uL   Immature Granulocytes 1 %   Abs Immature Granulocytes 0.09 (H) 0.00 - 0.07 K/uL    Comment: Performed at [facility], [address] [place], [place] [address]  Urinalysis, Routine w reflex microscopic -Urine, Clean Catch     Status: Abnormal   Collection Time: 10/26/22 12:34 AM  Result Value Ref Range   Color, Urine YELLOW YELLOW   APPearance CLEAR CLEAR   Specific Gravity, Urine 1.039 (H) 1.005 - 1.030   pH 7.0 5.0 - 8.0   Glucose, UA 50 (A) NEGATIVE mg/dL   Hgb urine dipstick NEGATIVE NEGATIVE   Bilirubin Urine NEGATIVE NEGATIVE   Ketones, ur NEGATIVE NEGATIVE mg/dL   Protein, ur NEGATIVE NEGATIVE mg/dL   Nitrite NEGATIVE NEGATIVE   Leukocytes,Ua NEGATIVE NEGATIVE    Comment: Performed at [facility], [address], [place], [place] [address]   CT ABDOMEN PELVIS W CONTRAST  Result Date: 10/25/2022 CLINICAL DATA:  Bowel obstruction suspected Recent back surgery. EXAM: CT ABDOMEN AND PELVIS WITH CONTRAST TECHNIQUE: Multidetector CT imaging of the abdomen and pelvis was performed using the standard protocol following bolus administration of intravenous contrast. RADIATION DOSE REDUCTION: This exam was performed according to the departmental dose-optimization program which includes automated exposure control, adjustment of the mA and/or kV according to patient size and/or use of iterative reconstruction technique. CONTRAST:  75mL OMNIPAQUE IOHEXOL 350 MG/ML SOLN COMPARISON:  CT 08/14/2017 FINDINGS: Lower chest: Upper normal heart  size with coronary artery calcifications. No pleural fluid or basilar airspace disease. Hepatobiliary: No focal liver abnormality is seen. No gallstones, gallbladder wall thickening, or biliary dilatation. Pancreas: Unremarkable. No pancreatic ductal dilatation or surrounding inflammatory changes. Spleen: Small in size. No focal abnormality. Adrenals/Urinary Tract: Normal adrenal glands. No hydronephrosis or perinephric edema. Homogeneous renal enhancement with symmetric excretion on delayed phase imaging. Simple left  renal cyst, needs no further imaging follow-up. Urinary bladder is physiologically distended without wall thickening. Stomach/Bowel: There is moderate wall thickening about the duodenum, series 3, image 33 with adjacent peri duodenal edema and stranding. Air and fluid distention of the stomach. No definite gastric wall thickening. Occasional fluid-filled proximal small bowel but no abnormal distension or small bowel obstruction. Normal appendix. Moderate diffuse colonic stool burden. Distal descending and sigmoid colonic diverticulosis without diverticulitis. Vascular/Lymphatic: Aortic atherosclerosis. No aneurysm. Patent portal vein. No abdominopelvic adenopathy. Reproductive: Enlarged prostate spans 6.4 cm transverse. Other: No free air or focal fluid collection. No ascites. Musculoskeletal: Recent T10-T11 decompression, no definite postoperative fluid collection. Diffuse lumbar degenerative change. Vertebral body hemangioma within L1. IMPRESSION: 1. Moderate wall thickening about the duodenum with adjacent peri duodenal edema and stranding, suspicious for duodenitis or peptic ulcer disease. No perforation or abscess. 2. Mild gastric distension likely related to duodenal inflammation. No bowel obstruction. 3. Colonic diverticulosis without diverticulitis. 4. Enlarged prostate. 5. Recent T10-T11 decompression, no evident postoperative fluid collection. Aortic Atherosclerosis (ICD10-I70.0).  Electronically Signed   By: Narda Rutherford M.D.   On: 10/25/2022 22:14    Pending Labs Unresulted Labs (From admission, onward)    None       Vitals/Pain Today's Vitals   10/25/22 2011 10/25/22 2100 10/25/22 2107 10/26/22 0100  BP:  (!) 141/117  (!) 152/82  Pulse:  60  62  Resp:  15  15  Temp:  98.4 F (36.9 C)  99 F (37.2 C)  TempSrc:  Oral  Oral  SpO2: 98% 100%  100%  PainSc:   7  5     Isolation Precautions No active isolations  Medications Medications  acetaminophen (TYLENOL) tablet 500 mg (has no administration in time range)  aspirin EC tablet 81 mg (has no administration in time range)  HYDROcodone-acetaminophen (NORCO) 10-325 MG per tablet 1 tablet (has no administration in time range)  amLODipine (NORVASC) tablet 10 mg (has no administration in time range)  atenolol (TENORMIN) tablet 50 mg (has no administration in time range)  atorvastatin (LIPITOR) tablet 20 mg (has no administration in time range)  pantoprazole (PROTONIX) EC tablet 20 mg (has no administration in time range)  polyethylene glycol (MIRALAX / GLYCOLAX) packet 17 g (has no administration in time range)  senna-docusate (Senokot-S) tablet 2 tablet (2 tablets Oral Given 10/26/22 0108)  cyanocobalamin (VITAMIN B12) tablet 1,000 mcg (has no administration in time range)  cyclobenzaprine (FLEXERIL) tablet 10 mg (has no administration in time range)  cholecalciferol (VITAMIN D3) 25 MCG (1000 UNIT) tablet 2,000 Units (has no administration in time range)  acetaminophen (TYLENOL) tablet 650 mg (has no administration in time range)    Or  acetaminophen (TYLENOL) suppository 650 mg (has no administration in time range)  bisacodyl (DULCOLAX) suppository 10 mg (has no administration in time range)  sodium phosphate (FLEET) 7-19 GM/118ML enema 1 enema (has no administration in time range)  ondansetron (ZOFRAN) tablet 4 mg (has no administration in time range)    Or  ondansetron (ZOFRAN) injection 4 mg (has  no administration in time range)  hydrALAZINE (APRESOLINE) injection 5 mg (has no administration in time range)  zolpidem (AMBIEN) tablet 5 mg (has no administration in time range)  fentaNYL (SUBLIMAZE) injection 50 mcg (50 mcg Intravenous Given 10/25/22 2130)  ondansetron (ZOFRAN) injection 4 mg (4 mg Intravenous Given 10/25/22 2128)  iohexol (OMNIPAQUE) 350 MG/ML injection 75 mL (75 mLs Intravenous Contrast Given 10/25/22 2202)    Mobility walks with  device     Focused Assessments Generalized abdominal pain. Pt describes pain as sharp, pressure. Last BM 6/6   R Recommendations: See Admitting Provider Note  Report given to:   Additional Notes: Pt A&Ox4. Continent. Ambulates with walker at home.

## 2022-10-26 NOTE — Evaluation (Signed)
Occupational Therapy Evaluation Patient Details Name: Mitchell Morgan MRN: [id number] DOB: 29-Jul-1945 Today's Date: 10/26/2022   History of Present Illness Patient is 77 y.o. male admitted 6/9 with abdominal pain. Recently discharged frollowing T10-T11 decompressive laminectomy by Dr. Jordan Likes on 10/15/2022. Past medical history of GERD, high cholesterol, and hypertension.   Clinical Impression   Pt admitted for above dx, PTA patient reports needing assist with lower body bathing and dressing and ambulating with RW. Pt currently presenting with ataxic gait which impairs balance, needing Mod A for OOB mobility. Pt is more steady with RW but that is not his original baseline. Pt also has decreased sensation below his hips, he does demonstrate good carryover of back precautions. He is Mod to Max assist for lower body bADLs.Pt wants to get back to independent functioning and has the motivation to do so. Pt would benefit from continued acute skilled OT to address above deficits and help transition to next level of care. Patient has the potential to reach Mod I and demos the ability to tolerate 3 hours of therapy. Pt would benefit from an intensive rehab program to help maximize functional independence.       Recommendations for follow up therapy are one component of a multi-disciplinary discharge planning process, led by the attending physician.  Recommendations may be updated based on patient status, additional functional criteria and insurance authorization.   Assistance Recommended at Discharge Intermittent Supervision/Assistance  Patient can return home with the following A lot of help with walking and/or transfers;A lot of help with bathing/dressing/bathroom;Assistance with cooking/housework;Assist for transportation;Help with stairs or ramp for entrance    Functional Status Assessment  Patient has had a recent decline in their functional status and demonstrates the ability to make significant  improvements in function in a reasonable and predictable amount of time.  Equipment Recommendations  Other (comment) (sockaid, reacher, long sponge, shoe horn)    Recommendations for Other Services Rehab consult     Precautions / Restrictions Precautions Precautions: Fall;Back Precaution Booklet Issued: Yes (comment) Precaution Comments: pt able to state 3/3; required no cues to adhere Restrictions Weight Bearing Restrictions: No      Mobility Bed Mobility Overal bed mobility: Needs Assistance Bed Mobility: Rolling, Sidelying to Sit, Sit to Supine Rolling: Supervision Sidelying to sit: Supervision   Sit to supine: Min guard   General bed mobility comments: Good carryover of log roll techniques    Transfers Overall transfer level: Needs assistance Equipment used: Rolling walker (2 wheels) Transfers: Sit to/from Stand Sit to Stand: Min assist           General transfer comment: STS without RW Min A, pt unsteady and having a bit of trouble gathering balance      Balance Overall balance assessment: Needs assistance Sitting-balance support: Feet supported Sitting balance-Leahy Scale: Good     Standing balance support: During functional activity, No upper extremity supported Standing balance-Leahy Scale: Fair Standing balance comment: needs support for dynamic balance                           ADL either performed or assessed with clinical judgement   ADL Overall ADL's : Needs assistance/impaired Eating/Feeding: Independent;Sitting   Grooming: Standing;Min guard Grooming Details (indicate cue type and reason): with UE supported Upper Body Bathing: Sitting;Set up;Supervision/ safety   Lower Body Bathing: Moderate assistance;Sitting/lateral leans   Upper Body Dressing : Sitting;Independent   Lower Body Dressing: Sitting/lateral leans;Maximal assistance   Toilet  Transfer: Ambulation;Rolling walker (2 wheels);Min guard   Toileting- Clothing  Manipulation and Hygiene: Min guard;Sit to/from stand   Tub/ Shower Transfer: Min guard   Functional mobility during ADLs: Rolling walker (2 wheels);Moderate assistance General ADL Comments: Mod A HHA for hall level mobility no AD, min guard with RW.     Vision Baseline Vision/History: 1 Wears glasses       Perception     Praxis      Pertinent Vitals/Pain Pain Assessment Pain Assessment: Faces Faces Pain Scale: Hurts little more Pain Location: back Pain Descriptors / Indicators: Sore Pain Intervention(s): Monitored during session, Repositioned     Hand Dominance Right   Extremity/Trunk Assessment Upper Extremity Assessment Upper Extremity Assessment: Overall WFL for tasks assessed   Lower Extremity Assessment Lower Extremity Assessment: Generalized weakness RLE Deficits / Details: 4/5  hip flex & knee ext, knee flex 3+/5, DF/PF 4-/5 RLE Sensation: decreased proprioception;decreased light touch LLE Deficits / Details: 4/5 hip flex & knee ext, knee flex 3+/5, DF/PF 4-/5 LLE Sensation: decreased proprioception;decreased light touch   Cervical / Trunk Assessment Cervical / Trunk Assessment: Back Surgery   Communication Communication Communication: No difficulties   Cognition Arousal/Alertness: Awake/alert Behavior During Therapy: WFL for tasks assessed/performed Overall Cognitive Status: Within Functional Limits for tasks assessed                                       General Comments       Exercises     Shoulder Instructions      Home Living Family/patient expects to be discharged to:: Private residence Living Arrangements: Spouse/significant other Available Help at Discharge: Family;Available 24 hours/day Type of Home: House (condo in [facility]) Home Access: Stairs to enter 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 Son's house/mother-in-law apartment is one level with one step in from garage. Mother-in-law apartment has walk-in shower with built-in seat and comfort height toilet and is accessible with a RW.  Lives With: Spouse    Prior Functioning/Environment Prior Level of Function : Independent/Modified Independent             Mobility Comments: Previously independent; started using SPC started 4/30 about 3 days later started using standard walker. Since d/c recently using walker at all times, feels off balance. ADLs Comments: Previously independent and was driving. Pt reports he begain needing Supervision to Min guard for shower transfers and assist with LB ADLs after 09/15/22.        OT Problem List: Decreased activity tolerance;Impaired balance (sitting and/or standing);Pain      OT Treatment/Interventions: Self-care/ADL training;DME and/or AE instruction;Therapeutic activities;Patient/family education;Balance training    OT Goals(Current goals can be found in the care plan section) Acute Rehab OT Goals Patient Stated Goal: To be independent OT Goal Formulation: With patient Time For Goal Achievement: 11/09/22 Potential to Achieve Goals: Good ADL Goals Pt Will Perform Grooming: standing;with supervision Pt Will Perform Lower Body Bathing: sitting/lateral leans;with set-up;with adaptive equipment;with supervision Pt Will Perform Lower Body Dressing: with set-up;with supervision;sitting/lateral leans;with adaptive equipment  OT Frequency: Min 2X/week  Co-evaluation              AM-PAC OT "6 Clicks" Daily Activity     Outcome Measure Help from another person eating meals?: None Help from another person taking care of personal  grooming?: A Little Help from another person toileting, which includes using toliet, bedpan, or urinal?: A Little Help from another person bathing (including washing, rinsing, drying)?: A Lot Help from another person to put on and taking off regular upper body clothing?: None Help from another person to put on and taking off regular lower body clothing?: A Lot 6 Click Score: 18   End of Session Equipment Utilized During Treatment: Gait belt;Rolling walker (2 wheels) Nurse Communication: Mobility status  Activity Tolerance: Patient tolerated treatment well Patient left: in chair;with call bell/phone within reach  OT Visit Diagnosis: Unsteadiness on feet (R26.81);Other abnormalities of gait and mobility (R26.89);Ataxia, unspecified (R27.0);Pain Pain - part of body:  (back)                Time: 6295-2841 OT Time Calculation (min): 17 min Charges:  OT General Charges $OT Visit: 1 Visit OT Evaluation $OT Eval Low Complexity: 1 Low  10/26/2022  AB, OTR/L  Acute Rehabilitation Services  Office: [phone]   [name] 10/26/2022, 3:27 PM

## 2022-10-26 NOTE — Evaluation (Signed)
Physical Therapy Evaluation Patient Details Name: Mitchell Morgan MRN: [id number] DOB: 09/11/1945 Today's Date: 10/26/2022  History of Present Illness  Patient is 77 y.o. male admitted 6/9 with abdominal pain. Recently discharged frollowing T10-T11 decompressive laminectomy by Dr. Jordan Likes on 10/15/2022. Past medical history of GERD, high cholesterol, and hypertension.  Clinical Impression  Pt admitted with above diagnosis. Currently requires up to min assist with transfers, near min guard but not consistent. Min assist to ambulate without assistive device; min guard with rolling walker, however demonstrates ataxic pattern, slow gait speed and increased fall risk. Min assist on stairs. Per notes pt at a consistent min guard level prior to d/c including stairs. Would greatly benefit from AIR stay to improve safety and independence. He was highly active prior to thoracic pathology in May. 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 Will follow and progress as able during acute admission. Pt currently with functional limitations due to the deficits listed below (see PT Problem List). Pt will benefit from acute skilled PT to increase their independence and safety with mobility to allow discharge.          Recommendations for follow up therapy are one component of a multi-disciplinary discharge planning process, led by the attending physician.  Recommendations may be updated based on patient status, additional functional criteria and insurance authorization.     Assistance Recommended at Discharge Set up Supervision/Assistance  Patient can return home with the following  Assist for transportation;Help with stairs or ramp for entrance;A little help with walking and/or transfers;A little help with bathing/dressing/bathroom;Assistance with cooking/housework    Equipment Recommendations None recommended by PT  Recommendations for Other Services  Rehab consult     Functional Status Assessment Patient has had a recent decline in their functional status and demonstrates the ability to make significant improvements in function in a reasonable and predictable amount of time.     Precautions / Restrictions Precautions Precautions: Fall;Back Precaution Comments: pt able to state 3/3; required no cues to adhere Restrictions Weight Bearing Restrictions: No      Mobility  Bed Mobility Overal bed mobility: Needs Assistance Bed Mobility: Rolling, Sidelying to Sit Rolling: Supervision Sidelying to sit: Min guard       General bed mobility comments: Supervision to roll, good control, min guard to rise to EOB with cues for precautions as he tried to sit up with flexion initially.    Transfers Overall transfer level: Needs assistance Equipment used: Rolling walker (2 wheels) Transfers: Sit to/from Stand Sit to Stand: Min assist           General transfer comment: Practiced sit<>stand from bed and recliner. 2/3 performed at min guard, however required min assist on one occasion due to posterior LOB. Required cues to maintain precautions and avoid twisting when proceeding to sit.    Ambulation/Gait Ambulation/Gait assistance: Min assist, Min guard Gait Distance (Feet): 300 Feet (15 feet without AD) Assistive device: Rolling walker (2 wheels), 1 person hand held assist Gait Pattern/deviations: Step-through pattern, Decreased step length - right, Decreased step length - left, Decreased stride length, Ataxic Gait velocity: decreased Gait velocity interpretation: <1.31 ft/sec, indicative of household ambulator   General Gait Details: Ataxic pattern, RLE with less control than LLE. With AD pt min guard 300 feet, demonstrates reduced control and minor knee instability towards end of distance, noticable fatigue reported. Min assist without AD short distance in room, pt unstable with increased  fall risk. Educated on sequencing, safety, and awareness.  RW use at all times and within proximity.  Stairs Stairs: Yes Stairs assistance: Min assist Stair Management: One rail Right, One rail Left, Step to pattern, Forwards Number of Stairs: 2 General stair comments: Practiced stair navigation with min guard to accend, and min assist to descend showing reduced motor control in quad during eccentric phase of descent. Cues for technique, sequencing, and rail use - uses door frame at home.  Wheelchair Mobility    Modified Rankin (Stroke Patients Only)       Balance Overall balance assessment: Needs assistance Sitting-balance support: Feet supported Sitting balance-Leahy Scale: Good Sitting balance - Comments: Pt able to sit EOB without UE support.   Standing balance support: During functional activity, No upper extremity supported Standing balance-Leahy Scale: Fair Standing balance comment: can stand without UE support; needs support for dynamic tasks                             Pertinent Vitals/Pain Pain Assessment Pain Assessment: Faces Faces Pain Scale: Hurts little more Pain Location: back Pain Descriptors / Indicators: Sore (numbness in groin) Pain Intervention(s): Monitored during session, Repositioned    Home Living Family/patient expects to be discharged to:: Private residence Living Arrangements: Spouse/significant other Available Help at Discharge: Family;Available 24 hours/day Type of Home: House (condo in [facility]) Home Access: Stairs to enter 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 Built-in shower seat in mother-in-law apartment in son's house) Additional Comments: Pt spends part of time living in a mother-in-law apartment the family built within pt's son's house in [place]. Son's house/mother-in-law apartment is one level with one step in from  garage. Mother-in-law apartment has walk-in shower with built-in seat and comfort height toilet and is accessible with a RW.    Prior Function Prior Level of Function : Independent/Modified Independent             Mobility Comments: Previously independent; started using SPC started 4/30 about 3 days later started using standard walker. Since d/c recently using walker at all times, feels off balance. ADLs Comments: Previously independent and was driving. Pt reports he begain needing Supervision to Min guard for shower transfers and LB ADLs after 09/15/22.     Hand Dominance   Dominant Hand: Right    Extremity/Trunk Assessment   Upper Extremity Assessment Upper Extremity Assessment: Defer to OT evaluation    Lower Extremity Assessment Lower Extremity Assessment: Generalized weakness RLE Deficits / Details: 4/5  hip flex & knee ext, knee flex 3+/5, DF/PF 4-/5 RLE Sensation: decreased proprioception;decreased light touch LLE Deficits / Details: 4/5 hip flex & knee ext, knee flex 3+/5, DF/PF 4-/5 LLE Sensation: decreased proprioception;decreased light touch    Cervical / Trunk Assessment Cervical / Trunk Assessment: Back Surgery  Communication   Communication: No difficulties  Cognition Arousal/Alertness: Awake/alert Behavior During Therapy: WFL for tasks assessed/performed Overall Cognitive Status: Within Functional Limits for tasks assessed                                          General Comments      Exercises General Exercises - Lower Extremity Ankle Circles/Pumps: AROM, Both, 10 reps, Seated Long  Arc Quad: AROM, Both, 10 reps, Seated, Strengthening Hip ABduction/ADduction: Strengthening, Both, 10 reps, Seated   Assessment/Plan    PT Assessment Patient needs continued PT services  PT Problem List Decreased strength;Decreased range of motion;Decreased activity tolerance;Decreased balance;Decreased mobility;Decreased coordination;Decreased knowledge  of use of DME;Decreased safety awareness;Decreased knowledge of precautions;Impaired sensation;Obesity;Pain       PT Treatment Interventions DME instruction;Gait training;Stair training;Functional mobility training;Therapeutic activities;Therapeutic exercise;Balance training;Neuromuscular re-education;Cognitive remediation;Patient/family education    PT Goals (Current goals can be found in the Care Plan section)  Acute Rehab PT Goals Patient Stated Goal: Receive more rehab before going home. PT Goal Formulation: With patient Time For Goal Achievement: 11/09/22 Potential to Achieve Goals: Good    Frequency Min 5X/week     Co-evaluation               AM-PAC PT "6 Clicks" Mobility  Outcome Measure Help needed turning from your back to your side while in a flat bed without using bedrails?: None Help needed moving from lying on your back to sitting on the side of a flat bed without using bedrails?: A Little Help needed moving to and from a bed to a chair (including a wheelchair)?: A Little Help needed standing up from a chair using your arms (e.g., wheelchair or bedside chair)?: A Little Help needed to walk in hospital room?: A Little Help needed climbing 3-5 steps with a railing? : A Little 6 Click Score: 19    End of Session Equipment Utilized During Treatment: Gait belt Activity Tolerance: Patient tolerated treatment well Patient left: with call bell/phone within reach;in chair;with chair alarm set;with SCD's reapplied Nurse Communication: Mobility status PT Visit Diagnosis: Unsteadiness on feet (R26.81);History of falling (Z91.81);Other symptoms and signs involving the nervous system (R29.898);Difficulty in walking, not elsewhere classified (R26.2);Other abnormalities of gait and mobility (R26.89) Pain - Right/Left:  (Bilateral) Pain - part of body:  (groin and LEs with paresthesias)    Time: 1610-9604 PT Time Calculation (min) (ACUTE ONLY): 57 min   Charges:   PT  Evaluation $PT Eval Moderate Complexity: 1 Mod PT Treatments $Gait Training: 23-37 mins $Therapeutic Activity: 8-22 mins        Kathlyn Sacramento, PT, DPT [facility]  Rehabilitation Services Physical Therapist Office: [phone] Website: [email or web address]   Berton Mount 10/26/2022, 11:58 AM

## 2022-10-26 NOTE — Plan of Care (Signed)
  Problem: Bowel/Gastric: Goal: Gastrointestinal status for postoperative course will improve Outcome: Not Progressing   Problem: Pain Management: Goal: Pain level will decrease Outcome: Not Progressing   

## 2022-10-27 LAB — GLUCOSE, CAPILLARY: Glucose-Capillary: 116 mg/dL — ABNORMAL HIGH (ref 70–99)

## 2022-10-27 MED ORDER — ZOLPIDEM TARTRATE 5 MG PO TABS
5.0000 mg | ORAL_TABLET | Freq: Once | ORAL | Status: AC
  Administered 2022-10-27: 5 mg via ORAL
  Filled 2022-10-27: qty 1

## 2022-10-27 NOTE — Progress Notes (Signed)
Inpatient Rehab Admissions Coordinator:   I opened insurance request with [facility] and [facility] yesterday.  Met with pt today to update: I did receive approval from [employer] today, but pt would have a daily copay x6 days.  He would like to wait another day or so to see if [facility] will approve.  I've already called them today and left a message in an attempt to expedite their review and decision.  Will follow.   Estill Dooms, PT, DPT Admissions Coordinator [phone] 10/27/22  2:44 PM

## 2022-10-27 NOTE — Progress Notes (Signed)
Inpatient Rehab Admissions Coordinator:   Left message for contact at [facility] to ensure case received and to attempt to expedite review.  Will continue to follow.   Estill Dooms, PT, DPT Admissions Coordinator [phone] 10/27/22  12:04 PM

## 2022-10-27 NOTE — Progress Notes (Signed)
Overall stable.  No new issues or problems.  Patient remains unsteady on his feet with some subjective weakness and some ongoing numbness.  Symptoms remain significantly improved from preoperatively but not good enough for him to thrive at home.  He is afebrile.  His wound is healing well.  His motor examination is grossly intact bilaterally although there is some increased tone.  Sensory examination with some patchy distal sensory loss in both lower extremities with some continued diminished perineal sensation.  Abdomen soft.  Patient with large bowel movement last night.  Overall progressing about as would be expected following lower thoracic decompressive surgery for significant myelopathy.  Patient with residual thoracic myelopathy symptoms which will hopefully continue to improve over time.  I do believe that he would benefit from inpatient rehabilitation for further gait training and strengthening.  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  He has no significant neck pain.  He has some intermittent feelings of some numbness in his upper extremities which are mild.  It is unknown whether some of his residual dysfunction is contributed to by his cervical condition or whether most if not all the symptoms are coming from his thoracic myelopathy.  At this point I would like to go slowly with regard to his cervical spine.  I see no urgent indication to consider decompression and fusion surgery at C4-5.  I would like him to fully recover from his thoracic surgery before considering a work on his cervical spine.

## 2022-10-27 NOTE — Progress Notes (Signed)
Physical Therapy Treatment Patient Details Name: Mitchell Morgan MRN: [id number] DOB: [birth year] Today's Date: 10/27/2022   History of Present Illness Patient is 77 y.o. male admitted 6/9 with abdominal pain. Recently discharged frollowing T10-T11 decompressive laminectomy by Dr. Jordan Likes on 10/15/2022. Past medical history of GERD, high cholesterol, and hypertension.    PT Comments    Eager to participate with acute rehab today. Challenged patient with static and dynamic balance and gait challenges today. Required up to mod assist for balance when AD not available to support himself. Pt fatigues fairly quickly with balance challenges but works diligently and responds well to instructions and compensatory cues. BERG performed as well, scoring 7/56- essentially unable to tolerate any meaningful task without UE support on walker at this time. Emphasis on quality of gait with and without assistive device during training today. Attempted to void but still unable to tell when he needs to go. States he did have a BM today though. Patient will continue to benefit from skilled physical therapy services to further improve independence with functional mobility. Hopeful to get into rehab soon and progress his safety and independence further.  Patient will benefit from intensive inpatient follow up therapy, >3 hours/day    Recommendations for follow up therapy are one component of a multi-disciplinary discharge planning process, led by the attending physician.  Recommendations may be updated based on patient status, additional functional criteria and insurance authorization.  Follow Up Recommendations       Assistance Recommended at Discharge Set up Supervision/Assistance  Patient can return home with the following Assist for transportation;Help with stairs or ramp for entrance;A little help with walking and/or transfers;A little help with bathing/dressing/bathroom;Assistance with cooking/housework   Equipment  Recommendations  None recommended by PT    Recommendations for Other Services Rehab consult     Precautions / Restrictions Precautions Precautions: Fall;Back Precaution Booklet Issued: Yes (comment) Precaution Comments: pt able to state 3/3; required no cues to adhere Restrictions Weight Bearing Restrictions: No     Mobility  Bed Mobility Overal bed mobility: Needs Assistance Bed Mobility: Rolling, Sidelying to Sit Rolling: Supervision Sidelying to sit: Supervision       General bed mobility comments: Supervison for safety, using rail, slow to push up to EOB with some truncal weakness noted, maintains back precautions.    Transfers Overall transfer level: Needs assistance Equipment used: Rolling walker (2 wheels), None Transfers: Sit to/from Stand Sit to Stand: Min assist           General transfer comment: Min assist for balance, using back of knees heavily agains furniture for support to stabilize. Practiced transfers from several surfaces today.Cues for technique. Requires additional assist without the aide of RW for support immediately upon standing.    Ambulation/Gait Ambulation/Gait assistance: Min assist, Mod assist, Min guard Gait Distance (Feet): 60 Feet (+20, +25 (without AD)) Assistive device: Rolling walker (2 wheels), 1 person hand held assist, None Gait Pattern/deviations: Step-through pattern, Decreased step length - right, Decreased step length - left, Decreased stride length, Ataxic, Trunk flexed, Staggering left, Staggering right, Leaning posteriorly, Narrow base of support Gait velocity: decreased Gait velocity interpretation: <1.31 ft/sec, indicative of household ambulator   General Gait Details: Progressed with more challenging functional gait tasks today. Included sharp turns, backing up, and varies surfaces. Pt required up to mod assist without assistive device, and min assist intermittently with AD during more challenging tasks. Min guard with  RW walking forward and rounding wide turns, navigating congested areas. Cues for  safety, awareness, and to widen BOS. Ataxia greatly pronounced without AD for support.   Stairs             Wheelchair Mobility    Modified Rankin (Stroke Patients Only)       Balance Overall balance assessment: Needs assistance Sitting-balance support: Feet supported Sitting balance-Leahy Scale: Good Sitting balance - Comments: Pt able to sit EOB without UE support.   Standing balance support: During functional activity, No upper extremity supported Standing balance-Leahy Scale: Fair Standing balance comment: Stands briefly without UE support. Cannot stand without assist during any dynamic challenge.                 Standardized Balance Assessment Standardized Balance Assessment : Berg Balance Test Berg Balance Test Sit to Stand: Needs minimal aid to stand or to stabilize Standing Unsupported: Unable to stand 30 seconds unassisted Sitting with Back Unsupported but Feet Supported on Floor or Stool: Able to sit safely and securely 2 minutes Stand to Sit: Sits independently, has uncontrolled descent Transfers: Needs one person to assist Standing Unsupported with Eyes Closed: Needs help to keep from falling Standing Ubsupported with Feet Together: Needs help to attain position and unable to hold for 15 seconds From Standing, Reach Forward with Outstretched Arm: Loses balance while trying/requires external support From Standing Position, Pick up Object from Floor: Unable to try/needs assist to keep balance From Standing Position, Turn to Look Behind Over each Shoulder: Needs assist to keep from losing balance and falling Turn 360 Degrees: Needs assistance while turning Standing Unsupported, Alternately Place Feet on Step/Stool: Needs assistance to keep from falling or unable to try Standing Unsupported, One Foot in Front: Loses balance while stepping or standing Standing on One Leg: Unable  to try or needs assist to prevent fall Total Score: 7        Cognition Arousal/Alertness: Awake/alert Behavior During Therapy: WFL for tasks assessed/performed Overall Cognitive Status: Within Functional Limits for tasks assessed                                          Exercises Other Exercises Other Exercises: Static balance challenges. Essentially requires min assist up to mod assist at times. Performed wide BOS, narrow BOS, head turns, eyes open, eyes closed, stationary march, Heel/toe rocks, knee bends. Pt with frequent reach for RW but encouraging to limited as able to progress limits of stability and righting reactions. Still requires frequent/near constant assist to maintain balance with challenges when unsupported by RW.    General Comments General comments (skin integrity, edema, etc.): Hopeful to get higher level rehab to get his independence back.      Pertinent Vitals/Pain Pain Assessment Pain Assessment: Faces Faces Pain Scale: Hurts little more Pain Location: back,LEs Pain Descriptors / Indicators: Sore, Pins and needles Pain Intervention(s): Monitored during session, Repositioned    Home Living                          Prior Function            PT Goals (current goals can now be found in the care plan section) Acute Rehab PT Goals Patient Stated Goal: Receive more rehab before going home. PT Goal Formulation: With patient Time For Goal Achievement: 11/09/22 Potential to Achieve Goals: Good Progress towards PT goals: Progressing toward goals    Frequency  Min 5X/week      PT Plan Current plan remains appropriate    Co-evaluation              AM-PAC PT "6 Clicks" Mobility   Outcome Measure  Help needed turning from your back to your side while in a flat bed without using bedrails?: None Help needed moving from lying on your back to sitting on the side of a flat bed without using bedrails?: A Little Help  needed moving to and from a bed to a chair (including a wheelchair)?: A Little Help needed standing up from a chair using your arms (e.g., wheelchair or bedside chair)?: A Little Help needed to walk in hospital room?: A Little Help needed climbing 3-5 steps with a railing? : A Little 6 Click Score: 19    End of Session Equipment Utilized During Treatment: Gait belt Activity Tolerance: Patient tolerated treatment well Patient left: with call bell/phone within reach;in chair;with chair alarm set;with SCD's reapplied Nurse Communication: Mobility status PT Visit Diagnosis: Unsteadiness on feet (R26.81);History of falling (Z91.81);Other symptoms and signs involving the nervous system (R29.898);Difficulty in walking, not elsewhere classified (R26.2);Other abnormalities of gait and mobility (R26.89);Ataxic gait (R26.0);Pain Pain - Right/Left:  (Bilateral) Pain - part of body:  (groin and LEs with paresthesias)     Time: 1610-9604 PT Time Calculation (min) (ACUTE ONLY): 35 min  Charges:  $Gait Training: 8-22 mins $Neuromuscular Re-education: 8-22 mins                     Kathlyn Sacramento, PT, DPT [facility]  Rehabilitation Services Physical Therapist Office: [phone] Website: [email or web address]    Berton Mount 10/27/2022, 12:50 PM

## 2022-10-28 NOTE — Progress Notes (Signed)
Inpatient Rehab Admissions Coordinator:   Awaiting determination from [place].  Per their representative this AM, case has been sent to medical director for review.   Estill Dooms, PT, DPT Admissions Coordinator [phone] 10/28/22  12:17 PM

## 2022-10-28 NOTE — Progress Notes (Signed)
Physical Therapy Treatment Patient Details Name: Mitchell Morgan MRN: [id number] DOB: [birth year] Today's Date: 10/28/2022   History of Present Illness Patient is 77 y.o. male admitted 6/9 with abdominal pain. Recently discharged frollowing T10-T11 decompressive laminectomy by Dr. Jordan Likes on 10/15/2022. Past medical history of GERD, high cholesterol, and hypertension.    PT Comments    Pt received in supine and eager for mobility. Pt requesting to use the bathroom at the beginning of the session and is able to perform pericare independently and hand hygiene at the sink with min guard. Pt reporting impaired sensation in BLE, which the pt associates with the ataxia. Pt able to tolerate increased gait distance this session with RW support for balance due to ataxia and instability of BLE. Pt able to participate in 2 short gait trials without AD to focus on balance and BLE control during gait. Pt with one LOB with retro stepping requiring mod A to correct. Pt demonstrating good ankle and hip balance strategies, however uses hip strategies often and does not appear to use stepping strategy. Pt demonstrating increased instability with head turns during static standing requiring up to min A to maintain balance. Pt continues to benefit from PT services to progress toward functional mobility goals.     Recommendations for follow up therapy are one component of a multi-disciplinary discharge planning process, led by the attending physician.  Recommendations may be updated based on patient status, additional functional criteria and insurance authorization.     Assistance Recommended at Discharge Set up Supervision/Assistance  Patient can return home with the following Assist for transportation;Help with stairs or ramp for entrance;A little help with walking and/or transfers;A little help with bathing/dressing/bathroom;Assistance with cooking/housework   Equipment Recommendations  None recommended by PT     Recommendations for Other Services       Precautions / Restrictions Precautions Precautions: Fall;Back Precaution Booklet Issued: Yes (comment) Precaution Comments: pt able to state 3/3; required no cues to adhere Restrictions Weight Bearing Restrictions: No     Mobility  Bed Mobility Overal bed mobility: Needs Assistance Bed Mobility: Rolling, Sidelying to Sit Rolling: Supervision Sidelying to sit: Supervision       General bed mobility comments: Pt demonstrating logroll technique without cues    Transfers Overall transfer level: Needs assistance Equipment used: Rolling walker (2 wheels), None Transfers: Sit to/from Stand Sit to Stand: Min guard, Min assist           General transfer comment: Pt able to power up without assist from various surfaces, however requires min A for balance to achieve upright posture with and without RW    Ambulation/Gait Ambulation/Gait assistance: Min assist, Min guard Gait Distance (Feet): 200 Feet (+10, +10 (without AD)) Assistive device: Rolling walker (2 wheels), None Gait Pattern/deviations: Step-through pattern, Decreased step length - right, Decreased step length - left, Decreased stride length, Ataxic, Leaning posteriorly, Narrow base of support, Staggering left, Staggering right Gait velocity: decreased   Pre-gait activities: Weight shifting and static marching General Gait Details: Pt demonstrating ataxic gait with and without RW support. Pt demonstrating narrow BOS, nearly heel-toe at times, but is able to improve with cues. Pt performing 2 short gait trials with forward and retro stepping without AD demonstrating increased ataxia and effort to maintain balance. Pt with one LOB during retro stepping requiring mod A to correct.     Balance Overall balance assessment: Needs assistance Sitting-balance support: Feet supported Sitting balance-Leahy Scale: Good Sitting balance - Comments: sitting EOB  Standing balance  support: During functional activity, No upper extremity supported, Bilateral upper extremity supported Standing balance-Leahy Scale: Fair Standing balance comment: Requires UE support or assist with balance with dynamic tasks               High Level Balance Comments: Pt able to stand and perform vertical and horizontal head turns with increased instability requiring intermittent min A to maintain balance, but pt using ankle and hip strategies.            Cognition Arousal/Alertness: Awake/alert Behavior During Therapy: WFL for tasks assessed/performed Overall Cognitive Status: Within Functional Limits for tasks assessed                                          Exercises      General Comments        Pertinent Vitals/Pain Pain Assessment Pain Assessment: Faces Faces Pain Scale: Hurts a little bit Pain Location: back Pain Descriptors / Indicators: Sore Pain Intervention(s): Monitored during session, Repositioned     PT Goals (current goals can now be found in the care plan section) Acute Rehab PT Goals Patient Stated Goal: Receive more rehab before going home. PT Goal Formulation: With patient Time For Goal Achievement: 11/09/22 Potential to Achieve Goals: Good Progress towards PT goals: Progressing toward goals    Frequency    Min 5X/week      PT Plan Current plan remains appropriate       AM-PAC PT "6 Clicks" Mobility   Outcome Measure  Help needed turning from your back to your side while in a flat bed without using bedrails?: None Help needed moving from lying on your back to sitting on the side of a flat bed without using bedrails?: A Little Help needed moving to and from a bed to a chair (including a wheelchair)?: A Little Help needed standing up from a chair using your arms (e.g., wheelchair or bedside chair)?: A Little Help needed to walk in hospital room?: A Little Help needed climbing 3-5 steps with a railing? : A Little 6  Click Score: 19    End of Session Equipment Utilized During Treatment: Gait belt Activity Tolerance: Patient tolerated treatment well Patient left: with call bell/phone within reach;in chair Nurse Communication: Mobility status PT Visit Diagnosis: Unsteadiness on feet (R26.81);History of falling (Z91.81);Other symptoms and signs involving the nervous system (R29.898);Difficulty in walking, not elsewhere classified (R26.2);Other abnormalities of gait and mobility (R26.89);Ataxic gait (R26.0);Pain     Time: 1610-9604 PT Time Calculation (min) (ACUTE ONLY): 30 min  Charges:  $Gait Training: 8-22 mins $Neuromuscular Re-education: 8-22 mins                     Johny Shock, PTA Acute Rehabilitation Services Secure Chat Preferred  [phone]    Johny Shock 10/28/2022, 11:14 AM

## 2022-10-28 NOTE — Progress Notes (Signed)
Occupational Therapy Treatment Patient Details Name: Mitchell Morgan MRN: [id number] DOB: 15-Apr-1946 Today's Date: 10/28/2022   History of present illness Patient is 77 y.o. male admitted 6/9 with abdominal pain. Recently discharged frollowing T10-T11 decompressive laminectomy by Dr. Jordan Morgan on 10/15/2022. Past medical history of GERD, high cholesterol, and hypertension.   OT comments  Pt continuing to progress in OT sessions. Educated pt on the use of shoe horn and sock aid for LBD. Pt successfully using reacher with good trunk control of dynamic reaching to pick up 3/3 objects. Discussed with patient and family the use of non-slip mats on shower seat and shower floor to problem solve pt's FOF and sliding out of shower chair. Also problem solved with pt and family the positioning and use of grab bars at home. OT to continue to progress pt as able. DC plans remain appropriate for CIR.   Recommendations for follow up therapy are one component of a multi-disciplinary discharge planning process, led by the attending physician.  Recommendations may be updated based on patient status, additional functional criteria and insurance authorization.    Assistance Recommended at Discharge Intermittent Supervision/Assistance  Patient can return home with the following  A lot of help with walking and/or transfers;A lot of help with bathing/dressing/bathroom;Assistance with cooking/housework;Assist for transportation;Help with stairs or ramp for entrance   Equipment Recommendations  Other (comment) (reacher, long handled sponge, shoe horn)    Recommendations for Other Services      Precautions / Restrictions Precautions Precautions: Fall;Back Precaution Booklet Issued: Yes (comment) Restrictions Weight Bearing Restrictions: No       Mobility Bed Mobility Overal bed mobility: Needs Assistance Bed Mobility: Rolling, Sidelying to Sit Rolling: Supervision Sidelying to sit: Supervision       General  bed mobility comments: Pt demonstrating logroll technique without cues    Transfers                   General transfer comment: deferred     Balance Overall balance assessment: Needs assistance Sitting-balance support: Feet supported Sitting balance-Leahy Scale: Good Sitting balance - Comments: sitting EOB       Standing balance comment: NT                           ADL either performed or assessed with clinical judgement   ADL                       Lower Body Dressing: Sitting/lateral leans;With adaptive equipment;Set up;Supervision/safety Lower Body Dressing Details (indicate cue type and reason): pt using shoe horn and reacher to successfully complete LBD               General ADL Comments: Educated pt on the use of AE to complete dressing challenges from Pt report. Also problem solved home setup for additonal bath mats/non slip mats and grab bars in home upon DC    Extremity/Trunk Assessment              Vision       Perception     Praxis      Cognition Arousal/Alertness: Awake/alert Behavior During Therapy: WFL for tasks assessed/performed Overall Cognitive Status: Within Functional Limits for tasks assessed  Exercises      Shoulder Instructions       General Comments      Pertinent Vitals/ Pain       Pain Assessment Pain Assessment: Faces Faces Pain Scale: Hurts a little bit Pain Location: back Pain Descriptors / Indicators: Sore Pain Intervention(s): Monitored during session, Repositioned  Home Living                                          Prior Functioning/Environment              Frequency  Min 2X/week        Progress Toward Goals  OT Goals(current goals can now be found in the care plan section)  Progress towards OT goals: Progressing toward goals  Acute Rehab OT Goals Patient Stated Goal: to be independent OT  Goal Formulation: With patient Time For Goal Achievement: 11/09/22 Potential to Achieve Goals: Good  Plan Discharge plan remains appropriate;Frequency remains appropriate    Co-evaluation                 AM-PAC OT "6 Clicks" Daily Activity     Outcome Measure   Help from another person eating meals?: None Help from another person taking care of personal grooming?: A Little Help from another person toileting, which includes using toliet, bedpan, or urinal?: A Little Help from another person bathing (including washing, rinsing, drying)?: A Lot Help from another person to put on and taking off regular upper body clothing?: None Help from another person to put on and taking off regular lower body clothing?: A Little (with AE) 6 Click Score: 19    End of Session    OT Visit Diagnosis: Unsteadiness on feet (R26.81);Other abnormalities of gait and mobility (R26.89);Ataxia, unspecified (R27.0);Pain Pain - part of body:  (back)   Activity Tolerance Patient tolerated treatment well   Patient Left with call bell/phone within reach;in bed;with family/visitor present   Nurse Communication Mobility status        Time: 1430-1456 OT Time Calculation (min): 26 min  Charges: OT General Charges $OT Visit: 1 Visit OT Treatments $Self Care/Home Management : 23-37 mins  10/28/2022  AB, OTR/L  Acute Rehabilitation Services  Office: [phone]   [name] 10/28/2022, 3:20 PM

## 2022-10-28 NOTE — Progress Notes (Signed)
Overall stable.  Patient with no complaints of pain.  Did well with therapy yesterday.  Hopefully will go to rehab today for continued therapy.

## 2022-10-28 NOTE — Care Management Important Message (Signed)
Important Message  Patient Details  Name: FITZPATRICK ALBERICO MRN: [id number] Date of Birth: 01/08/1946   Medicare Important Message Given:  Yes     Sherilyn Banker 10/28/2022, 4:21 PM

## 2022-10-29 ENCOUNTER — Inpatient Hospital Stay (HOSPITAL_COMMUNITY)
Admission: RE | Admit: 2022-10-29 | Discharge: 2022-11-06 | DRG: 949 | Disposition: A | Discharge status: Home Health | Payer: No Typology Code available for payment source | Source: Intra-hospital | Attending: Physical Medicine and Rehabilitation | Admitting: Physical Medicine and Rehabilitation

## 2022-10-29 ENCOUNTER — Other Ambulatory Visit: Payer: Self-pay

## 2022-10-29 ENCOUNTER — Encounter (HOSPITAL_COMMUNITY): Payer: Self-pay | Admitting: Neurosurgery

## 2022-10-29 ENCOUNTER — Encounter (HOSPITAL_COMMUNITY): Payer: Self-pay | Admitting: Physical Medicine and Rehabilitation

## 2022-10-29 ENCOUNTER — Inpatient Hospital Stay (HOSPITAL_COMMUNITY): Payer: No Typology Code available for payment source

## 2022-10-29 DIAGNOSIS — Z48811 Encounter for surgical aftercare following surgery on the nervous system: Principal | ICD-10-CM

## 2022-10-29 DIAGNOSIS — K5900 Constipation, unspecified: Secondary | ICD-10-CM | POA: Diagnosis present

## 2022-10-29 DIAGNOSIS — Z8249 Family history of ischemic heart disease and other diseases of the circulatory system: Secondary | ICD-10-CM

## 2022-10-29 DIAGNOSIS — N400 Enlarged prostate without lower urinary tract symptoms: Secondary | ICD-10-CM | POA: Diagnosis present

## 2022-10-29 DIAGNOSIS — D72829 Elevated white blood cell count, unspecified: Secondary | ICD-10-CM | POA: Diagnosis present

## 2022-10-29 DIAGNOSIS — I10 Essential (primary) hypertension: Secondary | ICD-10-CM | POA: Diagnosis present

## 2022-10-29 DIAGNOSIS — E871 Hypo-osmolality and hyponatremia: Secondary | ICD-10-CM | POA: Diagnosis present

## 2022-10-29 DIAGNOSIS — G4700 Insomnia, unspecified: Secondary | ICD-10-CM | POA: Diagnosis not present

## 2022-10-29 DIAGNOSIS — K279 Peptic ulcer, site unspecified, unspecified as acute or chronic, without hemorrhage or perforation: Secondary | ICD-10-CM | POA: Diagnosis present

## 2022-10-29 DIAGNOSIS — M4714 Other spondylosis with myelopathy, thoracic region: Secondary | ICD-10-CM | POA: Diagnosis not present

## 2022-10-29 DIAGNOSIS — N319 Neuromuscular dysfunction of bladder, unspecified: Secondary | ICD-10-CM | POA: Diagnosis present

## 2022-10-29 DIAGNOSIS — R7303 Prediabetes: Secondary | ICD-10-CM | POA: Diagnosis present

## 2022-10-29 DIAGNOSIS — G2581 Restless legs syndrome: Secondary | ICD-10-CM | POA: Diagnosis present

## 2022-10-29 DIAGNOSIS — G8929 Other chronic pain: Secondary | ICD-10-CM | POA: Diagnosis present

## 2022-10-29 DIAGNOSIS — M25512 Pain in left shoulder: Secondary | ICD-10-CM

## 2022-10-29 DIAGNOSIS — K2980 Duodenitis without bleeding: Secondary | ICD-10-CM | POA: Diagnosis present

## 2022-10-29 DIAGNOSIS — Z841 Family history of disorders of kidney and ureter: Secondary | ICD-10-CM

## 2022-10-29 DIAGNOSIS — R252 Cramp and spasm: Secondary | ICD-10-CM | POA: Insufficient documentation

## 2022-10-29 DIAGNOSIS — K592 Neurogenic bowel, not elsewhere classified: Secondary | ICD-10-CM | POA: Diagnosis present

## 2022-10-29 DIAGNOSIS — L89152 Pressure ulcer of sacral region, stage 2: Secondary | ICD-10-CM | POA: Diagnosis present

## 2022-10-29 DIAGNOSIS — T380X5A Adverse effect of glucocorticoids and synthetic analogues, initial encounter: Secondary | ICD-10-CM | POA: Diagnosis present

## 2022-10-29 DIAGNOSIS — R7989 Other specified abnormal findings of blood chemistry: Secondary | ICD-10-CM

## 2022-10-29 DIAGNOSIS — E7800 Pure hypercholesterolemia, unspecified: Secondary | ICD-10-CM | POA: Diagnosis present

## 2022-10-29 DIAGNOSIS — Z85828 Personal history of other malignant neoplasm of skin: Secondary | ICD-10-CM

## 2022-10-29 DIAGNOSIS — D62 Acute posthemorrhagic anemia: Secondary | ICD-10-CM | POA: Insufficient documentation

## 2022-10-29 MED ORDER — LIDOCAINE HCL URETHRAL/MUCOSAL 2 % EX GEL: CUTANEOUS | Status: DC | PRN

## 2022-10-29 MED ORDER — AMLODIPINE BESYLATE 10 MG PO TABS
10.0000 mg | ORAL_TABLET | Freq: Every day | ORAL | Status: DC
  Administered 2022-10-30 – 2022-11-06 (×8): 10 mg via ORAL
  Filled 2022-10-29 (×8): qty 1

## 2022-10-29 MED ORDER — ATENOLOL 25 MG PO TABS
50.0000 mg | ORAL_TABLET | Freq: Every day | ORAL | Status: DC
  Administered 2022-10-30 – 2022-11-06 (×8): 50 mg via ORAL
  Filled 2022-10-29 (×8): qty 2

## 2022-10-29 MED ORDER — POLYETHYLENE GLYCOL 3350 17 G PO PACK
17.0000 g | PACK | Freq: Every day | ORAL | Status: DC
  Administered 2022-10-30 – 2022-11-06 (×8): 17 g via ORAL
  Filled 2022-10-29 (×8): qty 1

## 2022-10-29 MED ORDER — CYCLOBENZAPRINE HCL 10 MG PO TABS
10.0000 mg | ORAL_TABLET | Freq: Three times a day (TID) | ORAL | Status: DC | PRN
  Administered 2022-10-29 – 2022-11-02 (×6): 10 mg via ORAL
  Filled 2022-10-29 (×6): qty 1

## 2022-10-29 MED ORDER — ZOLPIDEM TARTRATE 5 MG PO TABS
5.0000 mg | ORAL_TABLET | Freq: Every evening | ORAL | Status: DC | PRN
  Administered 2022-10-29 – 2022-10-30 (×2): 5 mg via ORAL
  Filled 2022-10-29 (×2): qty 1

## 2022-10-29 MED ORDER — VITAMIN B-12 1000 MCG PO TABS
1000.0000 ug | ORAL_TABLET | Freq: Every day | ORAL | Status: DC
  Administered 2022-10-30 – 2022-11-06 (×8): 1000 ug via ORAL
  Filled 2022-10-29 (×8): qty 1

## 2022-10-29 MED ORDER — DICLOFENAC SODIUM 1 % EX GEL
2.0000 g | Freq: Two times a day (BID) | CUTANEOUS | Status: DC
  Administered 2022-10-29 – 2022-11-05 (×15): 2 g via TOPICAL
  Filled 2022-10-29: qty 100

## 2022-10-29 MED ORDER — VITAMIN D 25 MCG (1000 UNIT) PO TABS
2000.0000 [IU] | ORAL_TABLET | Freq: Every day | ORAL | Status: DC
  Administered 2022-10-30 – 2022-11-06 (×8): 2000 [IU] via ORAL
  Filled 2022-10-29 (×8): qty 2

## 2022-10-29 MED ORDER — ASPIRIN 81 MG PO TBEC
81.0000 mg | DELAYED_RELEASE_TABLET | Freq: Every day | ORAL | Status: DC
  Administered 2022-10-30 – 2022-11-06 (×8): 81 mg via ORAL
  Filled 2022-10-29 (×8): qty 1

## 2022-10-29 MED ORDER — BISACODYL 10 MG RE SUPP
10.0000 mg | Freq: Every day | RECTAL | Status: DC
  Administered 2022-10-29 – 2022-11-02 (×5): 10 mg via RECTAL
  Filled 2022-10-29 (×5): qty 1

## 2022-10-29 MED ORDER — HYDROCODONE-ACETAMINOPHEN 10-325 MG PO TABS
1.0000 | ORAL_TABLET | ORAL | Status: DC | PRN
  Administered 2022-10-31 – 2022-11-05 (×4): 1 via ORAL
  Filled 2022-10-29 (×5): qty 1

## 2022-10-29 MED ORDER — FLEET ENEMA 7-19 GM/118ML RE ENEM: 1.0000 | ENEMA | Freq: Once | RECTAL | Status: DC | PRN

## 2022-10-29 MED ORDER — ATORVASTATIN CALCIUM 10 MG PO TABS
20.0000 mg | ORAL_TABLET | Freq: Every day | ORAL | Status: DC
  Administered 2022-10-30 – 2022-11-06 (×8): 20 mg via ORAL
  Filled 2022-10-29 (×8): qty 2

## 2022-10-29 MED ORDER — PANTOPRAZOLE SODIUM 40 MG PO TBEC
40.0000 mg | DELAYED_RELEASE_TABLET | Freq: Two times a day (BID) | ORAL | Status: DC
  Administered 2022-10-29 – 2022-11-06 (×16): 40 mg via ORAL
  Filled 2022-10-29 (×16): qty 1

## 2022-10-29 MED ORDER — TAMSULOSIN HCL 0.4 MG PO CAPS
0.4000 mg | ORAL_CAPSULE | Freq: Every day | ORAL | Status: DC
  Administered 2022-10-29 – 2022-11-05 (×8): 0.4 mg via ORAL
  Filled 2022-10-29 (×8): qty 1

## 2022-10-29 MED ORDER — ALUM & MAG HYDROXIDE-SIMETH 200-200-20 MG/5ML PO SUSP: 30.0000 mL | ORAL | Status: DC | PRN

## 2022-10-29 MED ORDER — DIPHENHYDRAMINE HCL 25 MG PO CAPS: 25.0000 mg | ORAL_CAPSULE | Freq: Four times a day (QID) | ORAL | Status: DC | PRN

## 2022-10-29 MED ORDER — GUAIFENESIN-DM 100-10 MG/5ML PO SYRP: 5.0000 mL | ORAL_SOLUTION | Freq: Four times a day (QID) | ORAL | Status: DC | PRN

## 2022-10-29 MED ORDER — SENNOSIDES-DOCUSATE SODIUM 8.6-50 MG PO TABS
2.0000 | ORAL_TABLET | Freq: Two times a day (BID) | ORAL | Status: DC
  Administered 2022-10-30 – 2022-11-06 (×15): 2 via ORAL
  Filled 2022-10-29 (×15): qty 2

## 2022-10-29 MED ORDER — HYDROCODONE-ACETAMINOPHEN 5-325 MG PO TABS
1.0000 | ORAL_TABLET | ORAL | Status: DC | PRN
  Administered 2022-10-31 – 2022-11-02 (×3): 1 via ORAL
  Filled 2022-10-29 (×4): qty 1

## 2022-10-29 MED ORDER — ACETAMINOPHEN 500 MG PO TABS
500.0000 mg | ORAL_TABLET | Freq: Every morning | ORAL | Status: DC
  Administered 2022-10-30 – 2022-11-06 (×7): 500 mg via ORAL
  Filled 2022-10-29 (×7): qty 1

## 2022-10-29 MED ORDER — ACETAMINOPHEN 325 MG PO TABS
325.0000 mg | ORAL_TABLET | ORAL | Status: DC | PRN
  Administered 2022-11-01: 650 mg via ORAL
  Filled 2022-10-29: qty 2

## 2022-10-29 NOTE — Progress Notes (Signed)
Physical Medicine and Rehabilitation Consult Reason for Consult: Thoracic myelopathy Referring Physician: Pool     HPI: Mitchell Morgan is a 77 y.o. male with a history of hypertension who recently had a T10-T11 decompressive laminectomy for thoracic stenosis and myelopathy by Dr. Lelon Perla on 10/15/2022.  Initially inpatient rehab was recommended and planned but authorization never was received for such admission.  Patient ultimately decided to go home on 10/22/2022.  At home however, he struggled with his mobility as well as bowel and bladder control.  In addition to weakness his legs he has had sensory loss and paresthesias in both of his legs and he presented to the emergency department on 10/25/2022.  CT of the abdomen and pelvis showed signs of peptic ulcer disease.  No signs of surgical wound infection or complication were noted..  Patient appears to be voiding fairly well but has poor sense of his bowels.  He perhaps can feel a slight bit of anorectal contraction.  He has been up again with physical therapy and is min assist 300 feet with a rolling walker but is very ataxic and slow at times making him a significant fall risk.  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     Review of Systems  Constitutional:  Negative for fever.  HENT: Negative.    Eyes: Negative.   Respiratory: Negative.    Cardiovascular: Negative.   Gastrointestinal:  Positive for constipation.  Genitourinary:        Impaired sense of bladder  Musculoskeletal:  Positive for back pain.  Skin:  Negative for rash.  Neurological:  Positive for sensory change, focal weakness and weakness. Negative for dizziness.  Psychiatric/Behavioral:  Negative for substance abuse.         Past Medical History:  Diagnosis Date   GERD (gastroesophageal reflux disease)     High cholesterol     Hypertension           Past Surgical History:   Procedure Laterality Date   DECOMPRESSIVE LUMBAR LAMINECTOMY LEVEL 1 N/A 10/15/2022    Procedure: DECOMPRESSIVE thoracic laminectomy THORACIC TEN -ELEVEN;  Surgeon: Julio Sicks, MD;  Location: [facility] OR;  Service: Neurosurgery;  Laterality: N/A;    History reviewed. No pertinent family history. Social History:  reports that he has never smoked. He has never used smokeless tobacco. He reports that he does not drink alcohol and does not use drugs. Allergies:       Allergies  Allergen Reactions   Iodine Itching and Swelling      Red Eyes, Watery    Pravastatin Other (See Comments)      myalgia   Shrimp [Shellfish Allergy] Itching and Swelling      Lobster, crab   Swollen eyes   Simvastatin Other (See Comments)      myalgia          Medications Prior to Admission  Medication Sig Dispense Refill   acetaminophen (TYLENOL) 500 MG tablet Take 500 mg by mouth in the morning.       amLODipine (NORVASC) 10 MG tablet Take 10 mg by mouth daily.       aspirin EC (ASPIRIN 81) 81 MG tablet Take 81 mg by mouth daily.       atenolol (TENORMIN) 50 MG tablet Take 50 mg by mouth daily.  atorvastatin (LIPITOR) 20 MG tablet Take 20 mg by mouth daily.       Cholecalciferol 50 MCG (2000 UT) TABS Take 1 tablet by mouth daily.       cyclobenzaprine (FLEXERIL) 10 MG tablet Take 1 tablet (10 mg total) by mouth 3 (three) times daily as needed for muscle spasms. (Patient taking differently: Take 5 mg by mouth daily as needed for muscle spasms.) 30 tablet 0   diclofenac Sodium (VOLTAREN) 1 % GEL Apply 2 g topically in the morning, at noon, and at bedtime.       HYDROcodone-acetaminophen (NORCO) 10-325 MG tablet Take 1-2 tablets by mouth every 4 (four) hours as needed for severe pain ((score 7 to 10)). 12 tablet 0   pantoprazole (PROTONIX) 20 MG tablet Take 20 mg by mouth daily.       polyethylene glycol powder (GLYCOLAX/MIRALAX) 17 GM/SCOOP powder Take 1 capful (17 g) by mouth daily. 238 g 0   PROLIA 60 MG/ML  SOSY injection Inject 60 mg into the skin every 6 (six) months.       senna-docusate (SENOKOT-S) 8.6-50 MG tablet Take 2 tablets by mouth 2 (two) times daily.       zolpidem (AMBIEN) 10 MG tablet Take 10 mg by mouth at bedtime.       cyanocobalamin 1000 MCG tablet Take 1 tablet (1,000 mcg total) by mouth daily. 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 Built-in shower seat in mother-in-law apartment in son's house) Additional Comments: Pt spends part of time living in a mother-in-law apartment the family built within pt's son's house in [place]. Son's house/mother-in-law apartment is one level with one step in from garage. Mother-in-law apartment has walk-in shower with built-in seat and comfort height toilet and is accessible with a RW.  Lives With: Spouse  Functional History: Prior Function Prior Level of Function : Independent/Modified Independent Mobility Comments: Previously independent; started using SPC started 4/30 about 3 days later started using standard walker. Since d/c recently using walker at all times, feels off balance. ADLs Comments: Previously independent and was driving. Pt reports he begain needing Supervision to Min guard for shower  transfers and LB ADLs after 09/15/22. Functional Status:  Mobility: Bed Mobility Overal bed mobility: Needs Assistance Bed Mobility: Rolling, Sidelying to Sit Rolling: Supervision Sidelying to sit: Min guard General bed mobility comments: Supervision to roll, good control, min guard to rise to EOB with cues for precautions as he tried to sit up with flexion initially. Transfers Overall transfer level: Needs assistance Equipment used: Rolling walker (2 wheels) Transfers: Sit to/from Stand Sit to Stand: Min assist General transfer comment: Practiced sit<>stand from bed and recliner. 2/3 performed at min guard, however required min assist on one occasion due to posterior LOB. Required cues to maintain precautions and avoid twisting  when proceeding to sit. Ambulation/Gait Ambulation/Gait assistance: Min assist, Min guard Gait Distance (Feet): 300 Feet (15 feet without AD) Assistive device: Rolling walker (2 wheels), 1 person hand held assist Gait Pattern/deviations: Step-through pattern, Decreased step length - right, Decreased step length - left, Decreased stride length, Ataxic General Gait Details: Ataxic pattern, RLE with less control than LLE. With AD pt min guard 300 feet, demonstrates reduced control and minor knee instability towards end of distance, noticable fatigue reported. Min assist without AD short distance in room, pt unstable with increased fall risk. Educated on sequencing, safety, and awareness. RW use at all times and within proximity. Gait velocity: decreased Gait velocity interpretation: <1.31 ft/sec, indicative of household ambulator Stairs: Yes Stairs assistance: Min assist Stair Management: One rail Right, One rail Left, Step to pattern, Forwards Number of Stairs: 2 General stair comments: Practiced stair navigation with min guard to accend, and min assist to descend showing reduced motor control in quad during eccentric phase of descent. Cues for technique, sequencing,  and rail use - uses door frame at home.   ADL:   Cognition: Cognition Overall Cognitive Status: Within Functional Limits for tasks assessed Orientation Level: Oriented X4 Cognition Arousal/Alertness: Awake/alert Behavior During Therapy: WFL for tasks assessed/performed Overall Cognitive Status: Within Functional Limits for tasks assessed   Blood pressure (!) 155/83, pulse 65, temperature 98.5 F (36.9 C), temperature source Oral, resp. rate 18, SpO2 96 %. Physical Exam Constitutional:      General: He is not in acute distress. HENT:     Head: Normocephalic.     Nose: Nose normal.     Mouth/Throat:     Mouth: Mucous membranes are moist.  Eyes:     Extraocular Movements: Extraocular movements intact.  Cardiovascular:     Rate and Rhythm: Normal rate.  Pulmonary:     Effort: Pulmonary effort is normal.  Abdominal:     General: Abdomen is flat.  Musculoskeletal:        General: No swelling.     Comments: Mid-low back TTP. Left shoulder limited in abduction and ER/IR  Skin:    Comments: Back incision clean  Neurological:     Mental Status: He is alert.     Comments: Alert and oriented x 3. Normal insight and awareness. Intact Memory. Normal language and speech. Cranial nerve exam unremarkable. MMT: BUE 5/5. RLE 3+HF, KE and 4/5 ADF/PF. LLE 4- HF, KE and 4/5 ADF/PF. T11 sensory level but can sense gross touch. Has difficulty with HTS coordination bilaterally.  DTR's absent in LE. No resting tone.   Psychiatric:        Mood and Affect: Mood normal.        Behavior: Behavior normal.        Lab Results Last 24 Hours       Results for orders placed or performed during the hospital encounter of 10/25/22 (from the past 24 hour(s))  Comprehensive metabolic panel     Status: Abnormal    Collection Time: 10/25/22  9:25 PM  Result Value Ref Range    Sodium 128 (L) 135 - 145 mmol/L    Potassium 4.9 3.5 - 5.1 mmol/L    Chloride 92 (L) 98 - 111 mmol/L    CO2 23 22 - 32 mmol/L     Glucose, Bld 148 (H) 70 - 99 mg/dL    BUN 19 8 - 23 mg/dL    Creatinine, Ser 1.61 0.61 - 1.24 mg/dL    Calcium 9.1 8.9 -  10.3 mg/dL    Total Protein 5.8 (L) 6.5 - 8.1 g/dL    Albumin 3.1 (L) 3.5 - 5.0 g/dL    AST 27 15 - 41 U/L    ALT 46 (H) 0 - 44 U/L    Alkaline Phosphatase 50 38 - 126 U/L    Total Bilirubin 1.3 (H) 0.3 - 1.2 mg/dL    GFR, Estimated >16 >10 mL/min    Anion gap 13 5 - 15  Lipase, blood     Status: None    Collection Time: 10/25/22  9:25 PM  Result Value Ref Range    Lipase 36 11 - 51 U/L  CBC with Diff     Status: Abnormal    Collection Time: 10/25/22  9:25 PM  Result Value Ref Range    WBC 13.0 (H) 4.0 - 10.5 K/uL    RBC 4.86 4.22 - 5.81 MIL/uL    Hemoglobin 15.8 13.0 - 17.0 g/dL    HCT 96.0 45.4 - 09.8 %    MCV 93.2 80.0 - 100.0 fL    MCH 32.5 26.0 - 34.0 pg    MCHC 34.9 30.0 - 36.0 g/dL    RDW 11.9 14.7 - 82.9 %    Platelets 237 150 - 400 K/uL    nRBC 0.0 0.0 - 0.2 %    Neutrophils Relative % 88 %    Neutro Abs 11.4 (H) 1.7 - 7.7 K/uL    Lymphocytes Relative 7 %    Lymphs Abs 0.9 0.7 - 4.0 K/uL    Monocytes Relative 4 %    Monocytes Absolute 0.6 0.1 - 1.0 K/uL    Eosinophils Relative 0 %    Eosinophils Absolute 0.0 0.0 - 0.5 K/uL    Basophils Relative 0 %    Basophils Absolute 0.0 0.0 - 0.1 K/uL    Immature Granulocytes 1 %    Abs Immature Granulocytes 0.09 (H) 0.00 - 0.07 K/uL  Urinalysis, Routine w reflex microscopic -Urine, Clean Catch     Status: Abnormal    Collection Time: 10/26/22 12:34 AM  Result Value Ref Range    Color, Urine YELLOW YELLOW    APPearance CLEAR CLEAR    Specific Gravity, Urine 1.039 (H) 1.005 - 1.030    pH 7.0 5.0 - 8.0    Glucose, UA 50 (A) NEGATIVE mg/dL    Hgb urine dipstick NEGATIVE NEGATIVE    Bilirubin Urine NEGATIVE NEGATIVE    Ketones, ur NEGATIVE NEGATIVE mg/dL    Protein, ur NEGATIVE NEGATIVE mg/dL    Nitrite NEGATIVE NEGATIVE    Leukocytes,Ua NEGATIVE NEGATIVE       Imaging Results (Last 48 hours)  CT  ABDOMEN PELVIS W CONTRAST   Result Date: 10/25/2022 CLINICAL DATA:  Bowel obstruction suspected Recent back surgery. EXAM: CT ABDOMEN AND PELVIS WITH CONTRAST TECHNIQUE: Multidetector CT imaging of the abdomen and pelvis was performed using the standard protocol following bolus administration of intravenous contrast. RADIATION DOSE REDUCTION: This exam was performed according to the departmental dose-optimization program which includes automated exposure control, adjustment of the mA and/or kV according to patient size and/or use of iterative reconstruction technique. CONTRAST:  75mL OMNIPAQUE IOHEXOL 350 MG/ML SOLN COMPARISON:  CT 08/14/2017 FINDINGS: Lower chest: Upper normal heart size with coronary artery calcifications. No pleural fluid or basilar airspace disease. Hepatobiliary: No focal liver abnormality is seen. No gallstones, gallbladder wall thickening, or biliary dilatation. Pancreas: Unremarkable. No pancreatic ductal dilatation or surrounding inflammatory changes. Spleen: Small in size. No focal  abnormality. Adrenals/Urinary Tract: Normal adrenal glands. No hydronephrosis or perinephric edema. Homogeneous renal enhancement with symmetric excretion on delayed phase imaging. Simple left renal cyst, needs no further imaging follow-up. Urinary bladder is physiologically distended without wall thickening. Stomach/Bowel: There is moderate wall thickening about the duodenum, series 3, image 33 with adjacent peri duodenal edema and stranding. Air and fluid distention of the stomach. No definite gastric wall thickening. Occasional fluid-filled proximal small bowel but no abnormal distension or small bowel obstruction. Normal appendix. Moderate diffuse colonic stool burden. Distal descending and sigmoid colonic diverticulosis without diverticulitis. Vascular/Lymphatic: Aortic atherosclerosis. No aneurysm. Patent portal vein. No abdominopelvic adenopathy. Reproductive: Enlarged prostate spans 6.4 cm transverse.  Other: No free air or focal fluid collection. No ascites. Musculoskeletal: Recent T10-T11 decompression, no definite postoperative fluid collection. Diffuse lumbar degenerative change. Vertebral body hemangioma within L1. IMPRESSION: 1. Moderate wall thickening about the duodenum with adjacent peri duodenal edema and stranding, suspicious for duodenitis or peptic ulcer disease. No perforation or abscess. 2. Mild gastric distension likely related to duodenal inflammation. No bowel obstruction. 3. Colonic diverticulosis without diverticulitis. 4. Enlarged prostate. 5. Recent T10-T11 decompression, no evident postoperative fluid collection. Aortic Atherosclerosis (ICD10-I70.0). Electronically Signed   By: Narda Rutherford M.D.   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  Patient with residual lower extremity weakness and sensory loss as well as poor control of bowel function. Does the need for close, 24 hr/day medical supervision in concert with the patient's rehab needs make it unreasonable for this patient to be served in a less intensive setting? Yes Co-Morbidities requiring supervision/potential complications:  -Pain management -bowel mgt/program -bladder mgt/program -hx of left RTC injury Due to bladder management, bowel management, safety, skin/wound care, medication administration, pain management, and patient education, does the patient require 24 hr/day rehab nursing? Yes Does the patient require coordinated care of a physician, rehab nurse, therapy disciplines of PT, OT to address physical and functional deficits in the context of the above medical diagnosis(es)? Yes Addressing deficits in the following areas: balance, endurance, locomotion, strength, transferring, bowel/bladder control, bathing, dressing, grooming, toileting, and psychosocial support Can the patient actively participate in an intensive therapy  program of at least 3 hrs of therapy per day at least 5 days per week? Yes The potential for patient to make measurable gains while on inpatient rehab is excellent Anticipated functional outcomes upon discharge from inpatient rehab are modified independent  with PT, modified independent with OT, n/a with SLP. Estimated rehab length of stay to reach the above functional goals is: 7-10 days Anticipated discharge destination: Home Overall Rehab/Functional Prognosis: excellent   POST ACUTE RECOMMENDATIONS: This patient's condition is appropriate for continued rehabilitative care in the following setting: CIR Patient has agreed to participate in recommended program. Yes Note that insurance prior authorization may be required for reimbursement for recommended care.   Comment: Despite ambulating 300 ft with therapy, Mitchell Morgan has continued, significant proprioceptive sensory loss in his lower extremities which puts him at high risk for falls and injury. Furthermore, he has a chronic left rotator cuff injury which prevents him from fully utilizing his left upper extremity for balance. He also is still dealing with neurogenic bowel and hasn't had a bm since he went home. He has saddle anaesthesia and cannot sense the need to empty nor control his BM when he is emptying. While his bladder is better off than his bowels, he cannot completely  discern when his bladder is full.      MEDICAL RECOMMENDATIONS: For bowel clean-out, I recommend sorbitol 60 cc now followed by a soaps sud enema in 4-5 hours if no bm  Once his bowels empty, a bowel program should be initiated each morning or evening. This is best set up on inpatient rehab. The program could consist of a scheduled suppository at desired time of program.  Along with the daily suppository, daily senokot-s or  miralax can be scheduled about 12 hours prior to program time. Begin timed voids q3 hours while awake. Scan pt for PVR's and cath for volume  greater than 350cc.       I have personally performed a face to face diagnostic evaluation of this patient. Additionally, I have examined the patient's medical record including any pertinent labs and radiographic images. If the physician assistant has documented in this note, I have reviewed and edited or otherwise concur with the physician assistant's documentation.   Thanks,   Ranelle Oyster, MD 10/26/2022

## 2022-10-29 NOTE — Progress Notes (Signed)
Inpatient Rehab Admissions Coordinator:    I have [facility] insurance approval and a bed available for pt to admit to CIR today. Dr. Jordan Likes in agreement.  Will let pt/family and TOC team know.   Estill Dooms, PT, DPT Admissions Coordinator [phone] 10/29/22  9:44 AM

## 2022-10-29 NOTE — Progress Notes (Addendum)
PMR Admission Coordinator Pre-Admission Assessment   Patient: Mitchell Morgan is an 77 y.o., male MRN: [id number] DOB: 02-04-1946 Height:   Weight:                                                                                                                                                    Insurance Information HMO:     PPO:      PCP:      IPA:      80/20:      OTHER:  PRIMARY: [facility]      Policy#: [id number]      Subscriber: pt CM Name: Marylene Land      Phone#: [phone] ext [phone]     Email: [email or web address] Fax: [phone]  Pre-Cert#: [id number] auth from [place] for 6/13.  Per Marylene Land "I'm not aware that updates need to be provided."      Employer:  Benefits:  Phone #: [phone]     Name:  Eff. Date:      Deduct:       Out of Pocket Max:       Life Max:   CIR: 100%      SNF:  Outpatient:      Co-Pay:  Home Health:       Co-Pay:  DME:      Co-Pay:  Providers:  SECONDARYOrpah Clinton      Policy#: [id number]      Phone#: [phone]   Financial Counselor:       Phone#:    The "Data Collection Information Summary" for patients in Inpatient Rehabilitation Facilities with attached "Privacy Act Statement-Health Care Records" was provided and verbally reviewed with: Patient   Emergency Contact Information Contact Information       Name Relation Home Work Mobile    Chestnutt,Judy Spouse [phone]             Current Medical History  Patient Admitting Diagnosis: thoracic myelopathy   History of Present Illness: Pt is a 77 y/o male with PMH of HTN and recent T10-T11 decompression laminectomy per Dr. Jordan Likes on 5/30, who discharged home and readmitted to [facility] on 6/10 with complaints of abdominal pain and difficulty walking.  On original acute stay, CIR was attempted but insurance authorization was not received and pt ultimately decided to d/c home on 6/6 where he struggled with mobility and bowel/bladder control.  In addition to weakness in BLEs he has sensory loss and  paraesthesias.  CT showed signs of peptic ulcer disease.  No signs of surgical wound infection noted.  Patient is having saddle anaesthesia difficulty with bowel>bladder.  He is voiding every 90 minutes to insure no incontinent episodes, and has poor sense of fullness in bowel and poor coordination for effective evacuation.  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  Therapy strongly recommending CIR prior to return home.    Patient's medical record from [facility] has been reviewed by the rehabilitation admission coordinator and physician.   Past Medical History      Past Medical History:  Diagnosis Date   GERD (gastroesophageal reflux disease)     High cholesterol     Hypertension        Has the patient had major surgery during 100 days prior to admission? Yes   Family History  family history is not on file.     Current Medications    Current Facility-Administered Medications:    acetaminophen (TYLENOL) tablet 650 mg, 650 mg, Oral, Q6H PRN **OR** acetaminophen (TYLENOL) suppository 650 mg, 650 mg, Rectal, Q6H PRN, Bergman, Meghan D, NP   acetaminophen (TYLENOL) tablet 500 mg, 500 mg, Oral, q AM, Bergman, Meghan D, NP, 500 mg at 10/28/22 0829   amLODipine (NORVASC) tablet 10 mg, 10 mg, Oral, Daily, Bergman, Meghan D, NP, 10 mg at 10/28/22 1610   aspirin EC tablet 81 mg, 81 mg, Oral, Daily, Bergman, Meghan D, NP, 81 mg at 10/28/22 0829   atenolol (TENORMIN) tablet 50 mg, 50 mg, Oral, Daily, Bergman, Meghan D, NP, 50 mg at 10/28/22 0830   atorvastatin (LIPITOR) tablet 20 mg, 20 mg, Oral, Daily, Bergman, Meghan D, NP, 20 mg at 10/28/22 0830   bisacodyl (DULCOLAX) suppository 10 mg, 10 mg, Rectal, Daily PRN, Bergman, Meghan D, NP   cholecalciferol (VITAMIN D3) 25 MCG (1000 UNIT) tablet 2,000 Units, 2,000 Units, Oral, Daily, Bergman, Meghan D, NP, 2,000 Units at 10/28/22 0830    cyanocobalamin (VITAMIN B12) tablet 1,000 mcg, 1,000 mcg, Oral, Daily, Bergman, Meghan D, NP, 1,000 mcg at 10/28/22 0830   cyclobenzaprine (FLEXERIL) tablet 10 mg, 10 mg, Oral, TID PRN, Val Eagle D, NP, 10 mg at 10/28/22 2258   hydrALAZINE (APRESOLINE) injection 5 mg, 5 mg, Intravenous, Q6H PRN, Bergman, Meghan D, NP   HYDROcodone-acetaminophen (NORCO) 10-325 MG per tablet 1 tablet, 1 tablet, Oral, Q4H PRN, Doran Durand, Meghan D, NP, 1 tablet at 10/28/22 2258   ondansetron (ZOFRAN) tablet 4 mg, 4 mg, Oral, Q6H PRN **OR** ondansetron (ZOFRAN) injection 4 mg, 4 mg, Intravenous, Q6H PRN, Bergman, Meghan D, NP   pantoprazole (PROTONIX) EC tablet 40 mg, 40 mg, Oral, Daily, Bergman, Meghan D, NP, 40 mg at 10/28/22 0830   polyethylene glycol (MIRALAX / GLYCOLAX) packet 17 g, 17 g, Oral, Daily, Bergman, Meghan D, NP, 17 g at 10/28/22 0831   senna-docusate (Senokot-S) tablet 2 tablet, 2 tablet, Oral, BID, Bergman, Meghan D, NP, 2 tablet at 10/28/22 2258   sodium phosphate (FLEET) 7-19 GM/118ML enema 1 enema, 1 enema, Rectal, Once PRN, Bergman, Meghan D, NP   zolpidem (AMBIEN) tablet 5 mg, 5 mg, Oral, QHS PRN, Julio Sicks, MD, 5 mg at 10/28/22 2258   Patients Current Diet:  Diet Order                  Diet Heart Room service appropriate? Yes; Fluid consistency: Thin  Diet effective now                         Precautions / Restrictions Precautions Precautions: Fall, Back Precaution Booklet Issued: Yes (comment) Precaution Comments: pt able to state 3/3; required no cues to adhere Restrictions Weight Bearing Restrictions: No    Has the patient had 2 or more falls or a  fall with injury in the past year?No   Prior Activity Level Community (5-7x/wk): indep prior to admit, no DME, driving, hoping to get back into working   Prior Functional Level Prior Function Prior Level of Function : Independent/Modified Independent Mobility Comments: Previously independent; started using SPC started  4/30 about 3 days later started using standard walker. Since d/c recently using walker at all times, feels off balance. ADLs Comments: Previously independent and was driving. Pt reports he begain needing Supervision to Min guard for shower transfers and assist with LB ADLs after 09/15/22.   Self Care: Did the patient need help bathing, dressing, using the toilet or eating?  Independent   Indoor Mobility: Did the patient need assistance with walking from room to room (with or without device)? Independent   Stairs: Did the patient need assistance with internal or external stairs (with or without device)? Independent   Functional Cognition: Did the patient need help planning regular tasks such as shopping or remembering to take medications? Independent   Patient Information Are you of Hispanic, Latino/a,or Spanish origin?: A. No, not of Hispanic, Latino/a, or Spanish origin What is your race?: A. White Do you need or want an interpreter to communicate with a doctor or health care staff?: 0. No   Patient's Response To:  Health Literacy and Transportation Is the patient able to respond to health literacy and transportation needs?: Yes Health Literacy - How often do you need to have someone help you when you read instructions, pamphlets, or other written material from your doctor or pharmacy?: Never In the past 12 months, has lack of transportation kept you from medical appointments or from getting medications?: No In the past 12 months, has lack of transportation kept you from meetings, work, or from getting things needed for daily living?: No   Journalist, newspaper / Equipment Home Assistive Devices/Equipment: Eyeglasses, Bedside commode/3-in-1, Environmental consultant (specify type) Home Equipment: Standard Ephraim Hamburger - single point, Other (comment)   Prior Device Use: Indicate devices/aids used by the patient prior to current illness, exacerbation or injury? Walker   Current Functional  Level Cognition   Overall Cognitive Status: Within Functional Limits for tasks assessed Orientation Level: Oriented X4    Extremity Assessment (includes Sensation/Coordination)   Upper Extremity Assessment: Overall WFL for tasks assessed  Lower Extremity Assessment: Generalized weakness RLE Deficits / Details: 4/5  hip flex & knee ext, knee flex 3+/5, DF/PF 4-/5 RLE Sensation: decreased proprioception, decreased light touch LLE Deficits / Details: 4/5 hip flex & knee ext, knee flex 3+/5, DF/PF 4-/5 LLE Sensation: decreased proprioception, decreased light touch     ADLs   Overall ADL's : Needs assistance/impaired Eating/Feeding: Independent, Sitting Grooming: Standing, Min guard Grooming Details (indicate cue type and reason): with UE supported Upper Body Bathing: Sitting, Set up, Supervision/ safety Lower Body Bathing: Moderate assistance, Sitting/lateral leans Upper Body Dressing : Sitting, Independent Lower Body Dressing: Sitting/lateral leans, With adaptive equipment, Set up, Supervision/safety Lower Body Dressing Details (indicate cue type and reason): pt using shoe horn and reacher to successfully complete LBD Toilet Transfer: Ambulation, Rolling walker (2 wheels), Min guard Toileting- Clothing Manipulation and Hygiene: Min guard, Sit to/from stand Tub/ Engineer, structural: Min guard Functional mobility during ADLs: Rolling walker (2 wheels), Moderate assistance General ADL Comments: Educated pt on the use of AE to complete dressing challenges from Pt report. Also problem solved home setup for additonal bath mats/non slip mats and grab bars in home upon DC     Mobility  Overal bed mobility: Needs Assistance Bed Mobility: Rolling, Sidelying to Sit Rolling: Supervision Sidelying to sit: Supervision Sit to supine: Min guard General bed mobility comments: Pt demonstrating logroll technique without cues     Transfers   Overall transfer level: Needs assistance Equipment used:  Rolling walker (2 wheels), None Transfers: Sit to/from Stand Sit to Stand: Min guard, Min assist General transfer comment: deferred     Ambulation / Gait / Stairs / Psychologist, prison and probation services   Ambulation/Gait Ambulation/Gait assistance: Min assist, Land (Feet): 200 Feet (+10, +10 (without AD)) Assistive device: Rolling walker (2 wheels), None Gait Pattern/deviations: Step-through pattern, Decreased step length - right, Decreased step length - left, Decreased stride length, Ataxic, Leaning posteriorly, Narrow base of support, Staggering left, Staggering right General Gait Details: Pt demonstrating ataxic gait with and without RW support. Pt demonstrating narrow BOS, nearly heel-toe at times, but is able to improve with cues. Pt performing 2 short gait trials with forward and retro stepping without AD demonstrating increased ataxia and effort to maintain balance. Pt with one LOB during retro stepping requiring mod A to correct. Gait velocity: decreased Gait velocity interpretation: <1.31 ft/sec, indicative of household ambulator Pre-gait activities: Weight shifting and static marching Stairs: Yes Stairs assistance: Min assist Stair Management: One rail Right, One rail Left, Step to pattern, Forwards Number of Stairs: 2 General stair comments: Practiced stair navigation with min guard to accend, and min assist to descend showing reduced motor control in quad during eccentric phase of descent. Cues for technique, sequencing, and rail use - uses door frame at home.     Posture / Balance Dynamic Sitting Balance Sitting balance - Comments: sitting EOB Balance Overall balance assessment: Needs assistance Sitting-balance support: Feet supported Sitting balance-Leahy Scale: Good Sitting balance - Comments: sitting EOB Standing balance support: During functional activity, No upper extremity supported, Bilateral upper extremity supported Standing balance-Leahy Scale: Fair Standing  balance comment: NT High Level Balance Comments: Pt able to stand and perform vertical and horizontal head turns with increased instability requiring intermittent min A to maintain balance, but pt using ankle and hip strategies. Standardized Balance Assessment Standardized Balance Assessment : Berg Balance Test Berg Balance Test Sit to Stand: Needs minimal aid to stand or to stabilize Standing Unsupported: Unable to stand 30 seconds unassisted Sitting with Back Unsupported but Feet Supported on Floor or Stool: Able to sit safely and securely 2 minutes Stand to Sit: Sits independently, has uncontrolled descent Transfers: Needs one person to assist Standing Unsupported with Eyes Closed: Needs help to keep from falling Standing Ubsupported with Feet Together: Needs help to attain position and unable to hold for 15 seconds From Standing, Reach Forward with Outstretched Arm: Loses balance while trying/requires external support From Standing Position, Pick up Object from Floor: Unable to try/needs assist to keep balance From Standing Position, Turn to Look Behind Over each Shoulder: Needs assist to keep from losing balance and falling Turn 360 Degrees: Needs assistance while turning Standing Unsupported, Alternately Place Feet on Step/Stool: Needs assistance to keep from falling or unable to try Standing Unsupported, One Foot in Front: Loses balance while stepping or standing Standing on One Leg: Unable to try or needs assist to prevent fall Total Score: 7     Special needs/care consideration Special service needs bowel/bladder program        Previous Home Environment (from acute therapy documentation) Living Arrangements: Spouse/significant other  Lives With: Spouse Available Help at Discharge: Family, Available 24 hours/day Type of  Home: House (condo in [facility]) Home Layout: One level Home Access: Stairs to enter Advertising account executive to [employer]) Entrance Stairs-Rails: None Secretary/administrator of Steps:  1 Bathroom Shower/Tub: Health visitor: Handicapped height Bathroom Accessibility: Yes Home Care Services: No Additional Comments: Pt spends part of time living in a mother-in-law apartment the family built within pt's son's house in [place]. Son's house/mother-in-law apartment is one level with one step in from garage. Mother-in-law apartment has walk-in shower with built-in seat and comfort height toilet and is accessible with a RW.   Discharge Living Setting Plans for Discharge Living Setting: Patient's home Type of Home at Discharge: Other (Comment) (condo in [facility]) Discharge Home Layout: One level Discharge Home Access: Level entry Entrance Stairs-Number of Steps: curb step from parking lot Discharge Bathroom Shower/Tub: Walk-in shower Discharge Bathroom Toilet: Handicapped height Discharge Bathroom Accessibility: Yes How Accessible: Accessible via wheelchair Does the patient have any problems obtaining your medications?: No   Social/Family/Support Systems Patient Roles: Spouse Anticipated Caregiver: Braylen Harbottle (spouse) Anticipated Caregiver's Contact Information: [phone] Ability/Limitations of Caregiver: 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 Pt/Family Agrees to Admission and willing to participate: Yes Program Orientation Provided & Reviewed with Pt/Caregiver Including Roles  & Responsibilities: Yes  Barriers to Discharge: Decreased caregiver support, Neurogenic Bowel & Bladder     Decrease burden of Care through IP  rehab admission: n/a     Possible need for SNF placement upon discharge:Not anticipated.  Pt with high PLOF and motivated to return.  Discharge plan return home with spouse and son to provide supervision.      Patient Condition: This patient's condition remains as documented in the consult dated 10/26/22, in which the Rehabilitation Physician determined and documented that the patient's condition is appropriate for intensive rehabilitative care in an inpatient rehabilitation facility. Will admit to inpatient rehab today. .   Preadmission Screen Completed By:  Stephania Fragmin, PT, DPT  10/29/2022 9:52 AM ______________________________________________________________________   Discussed status with Dr. Natale Lay on 10/29/22 at 9:52 AM  and received approval for admission today.   Admission Coordinator:  Stephania Fragmin, PT, DPT time 9:52 AM Dorna Bloom 10/29/22        Cosigned by: Fanny Dance, MD at 10/29/2022 12:05 PM

## 2022-10-29 NOTE — Discharge Summary (Signed)
Physician Discharge Summary  Patient ID: Mitchell Morgan MRN: [id number] DOB/AGE: 12-30-1945 77 y.o.  Admit date: 10/25/2022 Discharge date: 10/29/2022  Admission Diagnoses:  Discharge Diagnoses:  Principal Problem:   Thoracic myelopathy   Discharged Condition: good  Hospital Course:   Patient readmitted after difficulty with mobility and activities at home following thoracic decompressive surgery for treatment of his profound thoracic myelopathy.  Patient admitted with hopes of engaging inpatient rehab.  The patient has progressed with therapy.  He is found to be a suitable inpatient rehab candidate and will be transferred today.  Currently has minimal back pain.  He has no lower extremity pain.  He has some continued lower extremity weakness which is mild.  His activities are progressively improving with therapy.  Consults:   Significant Diagnostic Studies:   Treatments:   Discharge Exam: Blood pressure 132/83, pulse 70, temperature 97.6 F (36.4 C), resp. rate 16, SpO2 93 %.   Awake and alert.  Oriented and appropriate.  Speech fluent.  Judgment insight intact.  Cranial nerve function normal above.  Motor examination 5/5 bilateral upper extremities.  4+5 strength with some increased tone in bilateral lower extremities with some patchy distal sensory loss and some decreased perineal sensation.  Wound clean and dry.  Chest and abdomen benign.  Disposition: Discharge disposition: 02-Transferred to [facility]        Allergies as of 10/29/2022       Reactions   Iodine Itching, Swelling   Red Eyes, Watery    Pravastatin Other (See Comments)   myalgia   Shrimp [shellfish Allergy] Itching, Swelling   Lobster, crab Swollen eyes   Simvastatin Other (See Comments)   myalgia        Medication List     ASK your doctor about these medications    acetaminophen 500 MG tablet Commonly known as: TYLENOL Take 500 mg by mouth in the morning.   amLODipine 10 MG  tablet Commonly known as: NORVASC Take 10 mg by mouth daily.   Aspirin 81 81 MG tablet Generic drug: aspirin EC Take 81 mg by mouth daily.   atenolol 50 MG tablet Commonly known as: TENORMIN Take 50 mg by mouth daily.   atorvastatin 20 MG tablet Commonly known as: LIPITOR Take 20 mg by mouth daily.   Cholecalciferol 50 MCG (2000 UT) Tabs Take 1 tablet by mouth daily.   cyanocobalamin 1000 MCG tablet Take 1 tablet (1,000 mcg total) by mouth daily.   cyclobenzaprine 10 MG tablet Commonly known as: FLEXERIL Take 1 tablet (10 mg total) by mouth 3 (three) times daily as needed for muscle spasms.   dexamethasone 4 MG tablet Commonly known as: DECADRON Take 1 tablet twice a day for 2 days, then 1 tablet daily for 2 days, then stop   diclofenac Sodium 1 % Gel Commonly known as: VOLTAREN Apply 2 g topically in the morning, at noon, and at bedtime.   HYDROcodone-acetaminophen 10-325 MG tablet Commonly known as: NORCO Take 1-2 tablets by mouth every 4 (four) hours as needed for severe pain ((score 7 to 10)).   pantoprazole 20 MG tablet Commonly known as: PROTONIX Take 20 mg by mouth daily.   polyethylene glycol powder 17 GM/SCOOP powder Commonly known as: GLYCOLAX/MIRALAX Take 1 capful (17 g) by mouth daily.   Prolia 60 MG/ML Sosy injection Generic drug: denosumab Inject 60 mg into the skin every 6 (six) months.   senna-docusate 8.6-50 MG tablet Commonly known as: Senokot-S Take 2 tablets by mouth  2 (two) times daily.   zolpidem 10 MG tablet Commonly known as: AMBIEN Take 10 mg by mouth at bedtime.         Signed: Kathaleen Maser Mitchell Morgan 10/29/2022, 8:58 AM

## 2022-10-29 NOTE — Evaluation (Signed)
Occupational Therapy Assessment and Plan  Patient Details  Name: Mitchell Morgan MRN: [id number] Date of Birth: December 26, 1945  OT Diagnosis: acute pain and muscle weakness (generalized) Rehab Potential: Rehab Potential (ACUTE ONLY): Excellent ELOS: 7-10 days   Today's Date: 10/30/2022 OT Individual Time: 0848-1000 OT Individual Time Calculation (min): 72 min     Hospital Problem: Principal Problem:   Thoracic myelopathy   Past Medical History:  Past Medical History:  Diagnosis Date   Basal cell carcinoma    left cheek   BPH (benign prostatic hyperplasia)    Diverticulosis    GERD (gastroesophageal reflux disease)    High cholesterol    Hip pain, bilateral    left > right   Hypertension    Insomnia    Knee pain, bilateral    Prediabetes    Rotator cuff disorder, left    Past Surgical History:  Past Surgical History:  Procedure Laterality Date   DECOMPRESSIVE LUMBAR LAMINECTOMY LEVEL 1 N/A 10/15/2022   Procedure: DECOMPRESSIVE thoracic laminectomy THORACIC TEN -ELEVEN;  Surgeon: Julio Sicks, MD;  Location: [facility] OR;  Service: Neurosurgery;  Laterality: N/A;    Assessment & Plan Clinical Impression: Patient is a 77 year old male with history of HTN, obesity, back pain with falls, unsteady gait progressing to difficulty voiding and incontinence due to lack of perineal sensation who was originally admitted on 10/13/22 for for work up. He reports using a walker for ambulation since April. 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 Symptoms felt to be due to thoracic stenosis with myelopathy, he was started on decadron and underwent T10- T11 decompressive laminectomy by Dr. Jordan Likes.  PT/OT initiated with CIR recommendations but while awaiting authorization, patient progressed to min guard assist and  was discharged to home on 06/06.  At home he had continued difficulty with poor balance and had difficulty emptying his bowels.   He was readmitted on 06/09 with new onset of upper abdominal pain, no BM X 3 days and decrease in po intake and difficulty walking. He was found to be hyponatremia with Na 132 and CT abdomen/pelvis done revealing moderate thickening about duodenum with adjacent edema suspicious for duodenitis and PUD, mild gastric distension likely due to duodenal inflammation w/suspicion for duodenitis or PUD, enlarged prostate 6.4 cm (transverse span) and no post op T10- T11 decompression without post op fluid collection. He was admitted for work up, started on Protonix and reported inability to void as well as issues with constipation. 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 Patient transferred to CIR on 10/29/2022 .     Patient currently requires min with basic self-care skills secondary to muscle weakness, decreased sensation, and decreased standing balance and decreased balance strategies.  Prior to hospitalization, patient could complete all ADL and IADL tasks with independent .  Patient will benefit from skilled intervention to decrease level of assist with basic self-care skills and increase independence with basic self-care skills prior to discharge home with care partner.  Anticipate patient will require intermittent supervision and follow up outpatient.  OT - End of Session Activity Tolerance: Decreased this session Endurance Deficit: Yes Endurance Deficit Description: impaired OT Assessment Rehab Potential (ACUTE ONLY): Excellent OT Patient demonstrates impairments in the following  area(s): Balance;Sensory;Endurance;Safety OT Basic ADL's Functional Problem(s): Grooming;Bathing;Dressing;Toileting OT Advanced ADL's Functional Problem(s): Simple Meal Preparation OT  Transfers Functional Problem(s): Toilet;Tub/Shower OT Plan OT Intensity: Minimum of 1-2 x/day, 45 to 90 minutes OT Frequency: 5 out of 7 days OT Duration/Estimated Length of Stay: 7-10 days OT Treatment/Interventions: Balance/vestibular training;Self Care/advanced ADL retraining;Neuromuscular re-education;DME/adaptive equipment instruction;Therapeutic Exercise;UE/LE Strength taining/ROM;Patient/family education;Community reintegration;Discharge planning;Functional mobility training;Therapeutic Activities;UE/LE Coordination activities OT Basic Self-Care Anticipated Outcome(s): Mod I OT Toileting Anticipated Outcome(s): Mod I OT Bathroom Transfers Anticipated Outcome(s): SBA OT Recommendation Patient destination: Home Follow Up Recommendations: Outpatient OT Equipment Recommended: 3 in 1 bedside comode;Tub/shower seat;To be determined Equipment Details: SPC, RW   OT Evaluation Precautions/Restrictions  Precautions Precautions: Fall;Back Precaution Comments: pt able to state 3/3; required no cues to adhere, Back precautions posted above bed Restrictions Weight Bearing Restrictions: No Pain Pain Assessment Pain Scale: 0-10 Pain Score: 3  Pain Type: Acute pain Pain Location: Back Pain Orientation: Lower Pain Onset: On-going Pain Intervention(s): Repositioned Home Living/Prior Functioning Home Living Family/patient expects to be discharged to:: Private residence Living Arrangements: Spouse/significant other, Children Available Help at Discharge: Family, Available 24 hours/day (Supervision level assist in [place] or , more family support here) Type of Home: Other(Comment) (condo) Home Access: Actor of Steps: 1 (son's home) Entrance Stairs-Rails: None Home Layout: One level Bathroom Shower/Tub: Health visitor: Standard Bathroom Accessibility: Yes Additional Comments: Spends part of time in family home in [place] where Son is living.  Bathroom is handicap accessible with walk-in shower with built-in with comfort height toilet. 1 step to enter. Was independent prior although after April 1st, began to have difficulty with walking/mobility and required the use of AD (used cane initially and then RW). Hx of 3 falls in the past 6 months.  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 After July plans to completety retire. Prior Function Level of Independence: Independent with gait, Independent with transfers  Able to Take Stairs?: Yes Driving: Yes Vocation: Retired Administrator, sports Baseline Vision/History: 1 Wears glasses Ability to See in Adequate Light: 0 Adequate Patient Visual Report: No change from baseline Vision Assessment?: No apparent visual deficits Perception  Perception: Within Functional Limits Praxis Praxis: Intact Cognition Cognition Overall Cognitive Status: Within Functional Limits for tasks assessed Arousal/Alertness: Awake/alert Memory: Impaired Awareness: Impaired Problem Solving: Impaired Safety/Judgment: Appears intact Brief Interview for Mental Status (BIMS) Repetition of Three Words (First Attempt): 3 Temporal Orientation: Year: Correct Temporal Orientation: Month: Accurate within 5 days Temporal Orientation: Day: Correct Recall: "Sock": Yes, no cue required Recall: "Blue": Yes, no cue required Recall: "Bed": Yes, no cue required BIMS Summary Score: 15 Sensation Sensation Light Touch: Impaired Detail Peripheral sensation comments: BLE diminished, ~40% Light Touch Impaired Details: Impaired RLE;Impaired LLE Hot/Cold: Impaired by gross assessment Proprioception: Appears Intact Stereognosis: Impaired by gross assessment Additional Comments: Pt reports decreased sensation in BLE from feet to hips. Reports occassionally hands become tingling at night when sleeping (possible CTS?) Coordination Gross Motor  Movements are Fluid and Coordinated: No Motor  Motor Motor: Within Functional Limits  Trunk/Postural Assessment  Cervical Assessment Cervical Assessment: Within Functional Limits Thoracic Assessment Thoracic Assessment: Exceptions to [facility] (rounded shoulders. Left RTC injury) Lumbar Assessment Lumbar Assessment: Exceptions to [facility] (recent back surgery) Postural Control Postural Control: Deficits on evaluation Trunk Control: delayed due to recent back surgery  Balance Balance Balance Assessed: Yes Static Sitting Balance Static Sitting - Balance Support: No upper extremity supported;Feet supported Static  Sitting - Level of Assistance: 6: Modified independent (Device/Increase time) Dynamic Sitting Balance Dynamic Sitting - Balance Support: No upper extremity supported;During functional activity;Feet supported Dynamic Sitting - Level of Assistance: 5: Stand by assistance Sitting balance - Comments: sitting EOB and at sink during dressing/grooming task Static Standing Balance Static Standing - Balance Support: Bilateral upper extremity supported;During functional activity Static Standing - Level of Assistance: 4: Min assist (reliant on external device/support with at least one UE. Braced back of legs on WC) Dynamic Standing Balance Dynamic Standing - Balance Support: During functional activity;Left upper extremity supported;Right upper extremity supported (used at least 1 UE for support) Dynamic Standing - Level of Assistance: 4: Min assist (Min guard/CGA) Extremity/Trunk Assessment RUE Assessment RUE Assessment: Exceptions to [facility] Active Range of Motion (AROM) Comments: WFL General Strength Comments: shoulder flexion, abduction LUE Assessment LUE Assessment: Exceptions to [facility] Active Range of Motion (AROM) Comments: WFL in all ranges General Strength Comments: Shoulder flexion, abduction:  4/5, IR: 5/5, er: 3+/5, elbow flexion/extension: 5/5, functional gross grasp grasp.  Care  Tool Care Tool Self Care Eating   Eating Assist Level: Set up assist    Oral Care    Oral Care Assist Level: Set up assist    Bathing   Body parts bathed by patient: Right arm;Left arm;Abdomen;Chest;Front perineal area;Buttocks;Right upper leg;Left upper leg;Right lower leg;Left lower leg;Face     Assist Level: Set up assist    Upper Body Dressing(including orthotics)   What is the patient wearing?: Pull over shirt   Assist Level: Set up assist    Lower Body Dressing (excluding footwear)   What is the patient wearing?: Pants;Underwear/pull up Assist for lower body dressing: Set up assist    Putting on/Taking off footwear   What is the patient wearing?: Socks;Shoes Assist for footwear: Set up assist       Care Tool Toileting Toileting activity   Assist for toileting: Contact Guard/Touching assist     Care Tool Bed Mobility       Lying to sitting on side of bed activity   Lying to sitting on side of bed assist level: the ability to move from lying on the back to sitting on the side of the bed with no back support.: Minimal Assistance - Patient > 75%     Care Tool Transfers Sit to stand transfer   Sit to stand assist level: Contact Guard/Touching assist    Chair/bed transfer   Chair/bed transfer assist level: Contact Guard/Touching assist     Toilet transfer   Assist Level: Contact Guard/Touching assist     Care Tool Cognition  Expression of Ideas and Wants Expression of Ideas and Wants: 4. Without difficulty (complex and basic) - expresses complex messages without difficulty and with speech that is clear and easy to understand  Understanding Verbal and Non-Verbal Content Understanding Verbal and Non-Verbal Content: 4. Understands (complex and basic) - clear comprehension without cues or repetitions   Memory/Recall Ability Memory/Recall Ability : Current season;Staff names and faces;That he or she is in a hospital/hospital unit   Refer to Care Plan for Long Term  Goals  SHORT TERM GOAL WEEK 1 OT Short Term Goal 1 (Week 1): STG = LTGS (d/t ELOS)  Recommendations for other services: None    Skilled Therapeutic Intervention ADL ADL Eating: Set up Where Assessed-Eating: Bed level Grooming: Setup Where Assessed-Grooming: Sitting at sink Upper Body Bathing: Setup Where Assessed-Upper Body Bathing: Shower Lower Body Bathing: Setup;Supervision/safety (VC to maintain back precautions) Where Assessed-Lower  Body Bathing: Shower Upper Body Dressing: Setup Where Assessed-Upper Body Dressing: Wheelchair Where Assessed-Lower Body Dressing: Wheelchair Toileting: Contact guard Where Assessed-Toileting: Bedside Commode;Teacher, adult education: Curator Method: Proofreader: Bedside commode;Grab bars Film/video editor: Minimal assistance;Minimal cueing Film/video editor Method: Designer, industrial/product: Grab bars;Transfer tub bench Mobility  Bed Mobility Bed Mobility: Supine to Sit Supine to Sit: Minimal Assistance - Patient > 75% (with bed flat and no use of bed rails) Transfers Sit to Stand: Contact Guard/Touching assist Stand to Sit: Contact Guard/Touching assist Skilled Intervention:  Patient participated in OT evaluation. Completed bathing at shower level while seated. Dressing completed seated in WC at sink. Pt provided with VC 25-50% of the time to maintain back precautions during self care tasks. Discussed therapy schedule, safety policy and pt participated in making goals for therapy. Pt presents with decreased sensation in BLE which makes him a high risk of falls.   Discharge Criteria: Patient will be discharged from OT if patient refuses treatment 3 consecutive times without medical reason, if treatment goals not met, if there is a change in medical status, if patient makes no progress towards goals or if patient is discharged from hospital.  The above assessment,  treatment plan, treatment alternatives and goals were discussed and mutually agreed upon: by patient  Limmie Patricia, OTR/L,CBIS  Supplemental OT - [facility] and [facility] Secure Chat Preferred   10/30/2022, 11:30 AM

## 2022-10-29 NOTE — Progress Notes (Signed)
Inpatient Rehabilitation Admission Medication Review by a Pharmacist  A complete drug regimen review was completed for this patient to identify any potential clinically significant medication issues.  High Risk Drug Classes Is patient taking? Indication by Medication  Antipsychotic No   Anticoagulant No   Antibiotic No   Opioid Yes Vicodin - prn pain  Antiplatelet No   Hypoglycemics/insulin No   Vasoactive Medication Yes Amlodipine, atenolol- HTN  Chemotherapy No   Other Yes Atorvastatin - HLD Flexeril prn spasms  Pantoprazole - Reflux Zolpidem prn sleep     Type of Medication Issue Identified Description of Issue Recommendation(s)  Drug Interaction(s) (clinically significant)     Duplicate Therapy     Allergy     No Medication Administration End Date     Incorrect Dose     Additional Drug Therapy Needed     Significant med changes from prior encounter (inform family/care partners about these prior to discharge).    Other       Clinically significant medication issues were identified that warrant physician communication and completion of prescribed/recommended actions by midnight of the next day:  No  Pharmacist comments: None  Time spent performing this drug regimen review (minutes):  20 minutes  Okey Regal, PharmD

## 2022-10-29 NOTE — H&P (Signed)
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 He reports using a walker for ambulation since April. 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 Symptoms felt to be due to thoracic stenosis with myelopathy, he was started on decadron and underwent T10- T11 decompressive laminectomy by Dr. Jordan Likes. He was also found to have low normal B 12 level and started on supplement. PT/OT initiated with CIR recommendations but while awaiting authorization, patient progressed to min guard assist and was discharged to home on 06/06.  At home he had continued difficulty with poor balance and had difficulty emptying his bowels.   He was readmitted on 06/09 with new onset of upper abdominal pain, no BM X 3 days and decrease in po intake and difficulty walking. He was found to be hyponatremia with Na 132 and CT abdomen/pelvis done revealing moderate thickening about duodenum with adjacent edema suspicious for duodenitis and PUD, mild gastric distension likely due to duodenal inflammation w/suspicion for duodenitis or PUD, enlarged prostate 6.4 cm (transverse span) and no post op T10- T11 decompression without post op fluid collection.   He was admitted for work up, started on Protonix and reported inability to void as well as issues with constipation. 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  Patient reports he has been continent of bladder however reports decreased sensation of bladder fullness and it takes longer to empty.  He denies sensation when he needs to have a BM.  He denies bowel incontinence saying he has used multiple laxatives and was able to sit on the commode until he had a BM.  Dr. Jordan Likes would prefer to wait till patient fully recovered as no urgent need to consider C4/C5 intervention at this time. Patient was independent prior to onset of back issues in May and CIR recommended due to functional decline.     Review of Systems  Constitutional:  Negative for chills, fever and malaise/fatigue.  HENT:  Positive for hearing loss.   Eyes:  Negative for blurred vision and double vision.       Dry eyes  Respiratory:  Negative for cough and sputum production.   Cardiovascular:  Negative for chest pain and palpitations.  Gastrointestinal:  Positive for constipation. Negative for abdominal pain, nausea and vomiting.  Genitourinary:  Positive for frequency.  Musculoskeletal:  Positive for joint pain (left shoulder weak due to RTC injury. Bilateral knee pain). Negative for back pain.  Skin:  Negative for rash.  Neurological:  Positive for sensory change (from panus down feels numb/band at times under panus. bilateral hands are numb in am and usually can work it out.) and weakness.  Psychiatric/Behavioral:  The patient has insomnia.      Past Medical History:  Diagnosis Date   Basal cell carcinoma    left cheek   BPH (benign prostatic hyperplasia)  Diverticulosis    GERD (gastroesophageal reflux disease)    High cholesterol    Hip pain, bilateral    left > right   Hypertension    Insomnia    Knee pain, bilateral    Prediabetes    Rotator cuff disorder, left     Past Surgical History:  Procedure Laterality Date   DECOMPRESSIVE LUMBAR LAMINECTOMY LEVEL 1 N/A 10/15/2022   Procedure: DECOMPRESSIVE thoracic  laminectomy THORACIC TEN -ELEVEN;  Surgeon: Julio Sicks, MD;  Location: [facility] OR;  Service: Neurosurgery;  Laterality: N/A;    Family History  Problem Relation Age of Onset   Hypertension Mother    Kidney failure Mother    Heart disease Father     Social History:  Married and living with son since March 2024 but plans on going back to [facility]. 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  He  reports that he has never smoked. He has never used smokeless tobacco. He reports that he does not drink alcohol and does not use drugs.    Allergies  Allergen Reactions   Iodine Itching and Swelling    Red Eyes, Watery    Pravastatin Other (See Comments)    myalgia   Shrimp [Shellfish Allergy] Itching and Swelling    Lobster, crab  Swollen eyes   Simvastatin Other (See Comments)    myalgia    Medications Prior to Admission  Medication Sig Dispense Refill   acetaminophen (TYLENOL) 500 MG tablet Take 500 mg by mouth in the morning.     amLODipine (NORVASC) 10 MG tablet Take 10 mg by mouth daily.     aspirin EC (ASPIRIN 81) 81 MG tablet Take 81 mg by mouth daily.     atenolol (TENORMIN) 50 MG tablet Take 50 mg by mouth daily.     atorvastatin (LIPITOR) 20 MG tablet Take 20 mg by mouth daily.     Cholecalciferol 50 MCG (2000 UT) TABS Take 1 tablet by mouth daily.     cyanocobalamin 1000 MCG tablet Take 1 tablet (1,000 mcg total) by mouth daily. (Patient not taking: Reported on 10/26/2022)     cyclobenzaprine (FLEXERIL) 10 MG tablet Take 1 tablet (10 mg total) by mouth 3 (three) times daily as needed for muscle spasms. (Patient taking differently: Take 5 mg by mouth daily as needed for muscle spasms.) 30 tablet 0   dexamethasone (DECADRON) 4 MG tablet Take 1 tablet twice a day for 2 days, then 1 tablet daily for 2 days, then stop (Patient not taking: Reported on 10/26/2022) 6 tablet 0   diclofenac Sodium (VOLTAREN) 1 % GEL Apply 2 g topically in the  morning, at noon, and at bedtime.     HYDROcodone-acetaminophen (NORCO) 10-325 MG tablet Take 1-2 tablets by mouth every 4 (four) hours as needed for severe pain ((score 7 to 10)). 12 tablet 0   pantoprazole (PROTONIX) 20 MG tablet Take 20 mg by mouth daily.     polyethylene glycol powder (GLYCOLAX/MIRALAX) 17 GM/SCOOP powder Take 1 capful (17 g) by mouth daily. 238 g 0   PROLIA 60 MG/ML SOSY injection Inject 60 mg into the skin every 6 (six) months.     senna-docusate (SENOKOT-S) 8.6-50 MG tablet Take 2 tablets by mouth 2 (two) times daily.     zolpidem (AMBIEN) 10 MG tablet Take 10 mg by mouth at bedtime.            Home: Home Living  Family/patient expects to be discharged to:: Private residence Living Arrangements: Spouse/significant other Available Help at Discharge: Family, Available 24 hours/day Type of Home: House (condo in [place]) Home Access: Stairs to enter Advertising account executive to [employer]) Secretary/administrator of Steps: 1 Entrance Stairs-Rails: None Home Layout: One level Bathroom Shower/Tub: Health visitor: Handicapped height Bathroom Accessibility: Yes Home Equipment: Standard Environmental consultant, Medical laboratory scientific officer - single point, Other (comment) Additional Comments: Pt spends part of time living in a mother-in-law apartment the family built within pt's son's house in [place]. Son's house/mother-in-law apartment is one level with one step in from garage. Mother-in-law apartment has walk-in shower with built-in seat and comfort height toilet and is accessible with a RW.  Lives With: Spouse   Functional History: Prior Function Prior Level of Function : Independent/Modified Independent Mobility Comments: Previously independent; started using SPC started 4/30 about 3 days later started using standard walker. Since d/c recently using walker at all times, feels off balance. ADLs Comments: Previously independent and was driving. Pt reports he begain needing Supervision to Min guard for shower transfers  and assist with LB ADLs after 09/15/22.   Functional Status:  Mobility: Bed Mobility Overal bed mobility: Needs Assistance Bed Mobility: Rolling, Sidelying to Sit Rolling: Supervision Sidelying to sit: Supervision Sit to supine: Min guard General bed mobility comments: Pt demonstrating logroll technique without cues Transfers Overall transfer level: Needs assistance Equipment used: Rolling walker (2 wheels), None Transfers: Sit to/from Stand Sit to Stand: Min guard, Min assist General transfer comment: deferred Ambulation/Gait Ambulation/Gait assistance: Min assist, Min guard Gait Distance (Feet): 200 Feet (+10, +10 (without AD)) Assistive device: Rolling walker (2 wheels), None Gait Pattern/deviations: Step-through pattern, Decreased step length - right, Decreased step length - left, Decreased stride length, Ataxic, Leaning posteriorly, Narrow base of support, Staggering left, Staggering right General Gait Details: Pt demonstrating ataxic gait with and without RW support. Pt demonstrating narrow BOS, nearly heel-toe at times, but is able to improve with cues. Pt performing 2 short gait trials with forward and retro stepping without AD demonstrating increased ataxia and effort to maintain balance. Pt with one LOB during retro stepping requiring mod A to correct. Gait velocity: decreased Gait velocity interpretation: <1.31 ft/sec, indicative of household ambulator Pre-gait activities: Weight shifting and static marching Stairs: Yes Stairs assistance: Min assist Stair Management: One rail Right, One rail Left, Step to pattern, Forwards Number of Stairs: 2 General stair comments: Practiced stair navigation with min guard to accend, and min assist to descend showing reduced motor control in quad during eccentric phase of descent. Cues for technique, sequencing, and rail use - uses door frame at home.   ADL: ADL Overall ADL's : Needs assistance/impaired Eating/Feeding: Independent,  Sitting Grooming: Standing, Min guard Grooming Details (indicate cue type and reason): with UE supported Upper Body Bathing: Sitting, Set up, Supervision/ safety Lower Body Bathing: Moderate assistance, Sitting/lateral leans Upper Body Dressing : Sitting, Independent Lower Body Dressing: Sitting/lateral leans, With adaptive equipment, Set up, Supervision/safety Lower Body Dressing Details (indicate cue type and reason): pt using shoe horn and reacher to successfully complete LBD Toilet Transfer: Ambulation, Rolling walker (2 wheels), Min guard Toileting- Clothing Manipulation and Hygiene: Min guard, Sit to/from stand Tub/ Engineer, structural: Min guard Functional mobility during ADLs: Rolling walker (2 wheels), Moderate assistance General ADL Comments: Educated pt on the use of AE to complete dressing challenges from Pt report. Also problem solved home setup for additonal bath mats/non slip mats and grab bars in home upon DC  Cognition: Cognition Overall Cognitive Status: Within Functional Limits for tasks assessed Orientation Level: Oriented X4 Cognition Arousal/Alertness: Awake/alert Behavior During Therapy: WFL for tasks assessed/performed Overall Cognitive Status: Within Functional Limits for tasks assessed  Physical Exam: Blood pressure 123/69, pulse 64, temperature 98.1 F (36.7 C), temperature source Oral, resp. rate 18, height 5\' 9"  (1.753 m), weight 89.4 kg, SpO2 99 %.    General: Alert and oriented x 3, No apparent distress HEENT: Head is normocephalic, atraumatic, PERRLA, EOMI, sclera anicteric, oral mucosa pink and moist, dentition intact, ext ear canals clear, wearing glasses Neck: Supple without JVD or lymphadenopathy Heart: Reg rate and rhythm. No murmurs rubs or gallops Chest: CTA bilaterally without wheezes, rales, or rhonchi; no distress Abdomen: Soft, non-tender, + distended, bowel sounds positive. Extremities: No clubbing, cyanosis, or edema. Pulses are 2+ Psych:  Pt's affect is appropriate. Pt is cooperative Skin: Warm and dry, foam dressing over sacrum, thoracic incision appears to be healing well without signs of infection Neuro: Alert and oriented x 3, follows commands, cranial nerves II through XII intact, normal speech and language, good insight, memory appears to be intact Strength 5 out of 5 in bilateral upper extremities Strength right lower extremity hip flexion 4- out of 5, 4+ knee extension, 4+ ankle PF and DF Strength left lower extremity hip flexion 4- out of 5, 4/5 knee extension, 4+/5 ankle PF and DF Sensation decreased to light touch L1 dermatome and below throughout bilateral lower extremities,  sensation intact above this level,decreased perineal sensation, DTR 2+ bilateral knees, decreased bilateral ankles Hoffmann's negative No ankle clonus Altered coordination bilateral lower extremities with heel-to-shin Musculoskeletal: No joint swelling note Mild tender to palpation lower T-spine L shoulder with limited abduction  No results found for this or any previous visit (from the past 48 hour(s)). No results found.    Blood pressure 123/69, pulse 64, temperature 98.1 F (36.7 C), temperature source Oral, resp. rate 18, height 5\' 9"  (1.753 m), weight 89.4 kg, SpO2 99 %.  Medical Problem List and Plan: 1. 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 Dutch Quint on 10/15/2022.  Patient with continued lower extremity numbness and weakness and decreased control bowel bladder.  -patient may shower  -ELOS/Goals:  7-10 days, Sup to Mod I PT/OT, no SLP  -Admit to CIR 2.  Antithrombotics: -DVT/anticoagulation:  Mechanical: Sequential compression devices, below knee Bilateral lower extremities  -antiplatelet therapy: N/A 3. Pain Management: Tylenol 500 mg/hs.  --continue Flexeril and Hydrocodone prn.  --resume Voltaren gel to bilateral knees and left shoulder for chronic  pain.  --LEFT knee injected a month ago at Dr. Nilsa Nutting office.  4. Mood/Behavior/Sleep:  LCSW to follow for evaluation and support.   -antipsychotic agents: N/A  --continue Ambien prn for insomnia.  5. Neuropsych/cognition: This patient is capable of making decisions on his own behalf. 6. Skin/Wound Care: Monitor incision for healing.  7. Fluids/Electrolytes/Nutrition: Monitor I/O. Check CMET in am 8. Duodenitis/Evidence of PUD: Will increase PPI to BID for now. .  9. HTN: Monitor BP TID--continue atenolol 50mg  daily and amlodipine 10mg  daily.  10. Suboptimal B 12 level @ 195: Continue supplement.  11. Neurogenic bowel/constipation:  Continue Miralax and Senna S 2 tabs BID-->am/noon   --will check KUB for follow up on stool burden. Has been having small BMs.  --Daily suppository after supper, discussed neurogenic bowel 12. Hyponatremia: Na has been ranging from 128 to 133 range since 05/31.   --change diet to regular. (  No hx of CAD documented) 13. Leucocytosis: Has been on prednisone/steroids for 4-5 weeks followed by decadron with last admission.    --monitor for fevers and other signs of infection.  14. Abnormal LFTs: Recheck in am.  15. Pre diabetes w/stress induced/steroid induced hyperglycemia:  Will check A1c in am.   -CBGs stable on BMP 16. Neurogenic bladder/BPH: Has been toileting himself but now with frequency/voiding small amounts.  --Will monitor voiding function with bladder scan for PVR check. Start flomax 0.4mg  daily  I have personally performed a face to face diagnostic evaluation of this patient and formulated the key components of the plan.  Additionally, I have personally reviewed laboratory data, imaging studies, as well as relevant notes and concur with the physician assistant's documentation above.  The patient's status has not changed from the original H&P.  Any changes in documentation from the acute care chart have been noted above.  Fanny Dance, MD,  [place] [facility]    Jacquelynn Cree, PA-C 10/29/2022

## 2022-10-30 DIAGNOSIS — K592 Neurogenic bowel, not elsewhere classified: Secondary | ICD-10-CM | POA: Diagnosis not present

## 2022-10-30 DIAGNOSIS — M4714 Other spondylosis with myelopathy, thoracic region: Secondary | ICD-10-CM | POA: Diagnosis not present

## 2022-10-30 DIAGNOSIS — N319 Neuromuscular dysfunction of bladder, unspecified: Secondary | ICD-10-CM | POA: Diagnosis not present

## 2022-10-30 DIAGNOSIS — E871 Hypo-osmolality and hyponatremia: Secondary | ICD-10-CM | POA: Diagnosis not present

## 2022-10-30 LAB — HEMOGLOBIN A1C
Hgb A1c MFr Bld: 5.9 % — ABNORMAL HIGH (ref 4.8–5.6)
Mean Plasma Glucose: 122.63 mg/dL

## 2022-10-30 LAB — CBC WITH DIFFERENTIAL/PLATELET
Abs Immature Granulocytes: 0.03 10*3/uL (ref 0.00–0.07)
Basophils Absolute: 0 10*3/uL (ref 0.0–0.1)
Basophils Relative: 0 %
Eosinophils Absolute: 0.4 10*3/uL (ref 0.0–0.5)
Eosinophils Relative: 4 %
HCT: 35.9 % — ABNORMAL LOW (ref 39.0–52.0)
Hemoglobin: 12.3 g/dL — ABNORMAL LOW (ref 13.0–17.0)
Immature Granulocytes: 0 %
Lymphocytes Relative: 28 %
Lymphs Abs: 2.6 10*3/uL (ref 0.7–4.0)
MCH: 31.5 pg (ref 26.0–34.0)
MCHC: 34.3 g/dL (ref 30.0–36.0)
MCV: 92.1 fL (ref 80.0–100.0)
Monocytes Absolute: 0.6 10*3/uL (ref 0.1–1.0)
Monocytes Relative: 6 %
Neutro Abs: 5.8 10*3/uL (ref 1.7–7.7)
Neutrophils Relative %: 62 %
Platelets: 163 10*3/uL (ref 150–400)
RBC: 3.9 MIL/uL — ABNORMAL LOW (ref 4.22–5.81)
RDW: 12.2 % (ref 11.5–15.5)
WBC: 9.4 10*3/uL (ref 4.0–10.5)
nRBC: 0 % (ref 0.0–0.2)

## 2022-10-30 LAB — COMPREHENSIVE METABOLIC PANEL
ALT: 27 U/L (ref 0–44)
AST: 22 U/L (ref 15–41)
Albumin: 2.6 g/dL — ABNORMAL LOW (ref 3.5–5.0)
Alkaline Phosphatase: 50 U/L (ref 38–126)
Anion gap: 8 (ref 5–15)
BUN: 11 mg/dL (ref 8–23)
CO2: 27 mmol/L (ref 22–32)
Calcium: 8.3 mg/dL — ABNORMAL LOW (ref 8.9–10.3)
Chloride: 98 mmol/L (ref 98–111)
Creatinine, Ser: 1.01 mg/dL (ref 0.61–1.24)
GFR, Estimated: >60 mL/min (ref >60)
Glucose, Bld: 104 mg/dL — ABNORMAL HIGH (ref 70–99)
Potassium: 3.7 mmol/L (ref 3.5–5.1)
Sodium: 133 mmol/L — ABNORMAL LOW (ref 135–145)
Total Bilirubin: 0.7 mg/dL (ref 0.3–1.2)
Total Protein: 5.1 g/dL — ABNORMAL LOW (ref 6.5–8.1)

## 2022-10-30 NOTE — Progress Notes (Signed)
Dig stem performed.  No stool felt in the rectum.  Pt called out 1 hour later and had small bowel movement.

## 2022-10-30 NOTE — Progress Notes (Signed)
Occupational Therapy Session Note  Patient Details  Name: Mitchell Morgan MRN: [id number] Date of Birth: [birth year]  Today's Date: 10/30/2022 OT Individual Time: 1345-1425 OT Individual Time Calculation (min): 40 min    Short Term Goals: Week 1:  OT Short Term Goal 1 (Week 1): STG = LTGS (d/t ELOS)  Skilled Therapeutic Interventions/Progress Updates:    OT intervention with focus on review of back precautions, review of home/bathroom setup, AE use, bed mobility simulation at ~27", functional amb with RW, and discharge planning. Pt able to doff/don socks/shoes by incorporating figure 4 with BLE, Pt uses log roll technique for sit<>supine on mat to simulate bed moblity at home. Pt has walk in shower with built in seat. Amb with RW ~15' with CGA. No report of dizziness with transitional movement. Pt returned to bed at end of session. Bed alarm activated. All needs within reach.   Therapy Documentation Precautions:  Precautions Precautions: Fall, Back Precaution Comments: pt able to state 3/3; required no cues to adhere, Back precautions posted above bed Restrictions Weight Bearing Restrictions: No Pain:  Pt reports 4/10 upper back pain; repositioned and rest  Therapy/Group: Individual Therapy  Rich Brave 10/30/2022, 2:36 PM

## 2022-10-30 NOTE — IPOC Note (Signed)
Overall Plan of Care [facility]) Patient Details Name: Mitchell Morgan MRN: [id number] DOB: 1946-02-07  Admitting Diagnosis: Thoracic myelopathy  Hospital Problems: Principal Problem:   Thoracic myelopathy     Functional Problem List: Nursing Safety, Bladder, Bowel, Sensory, Skin Integrity, Endurance, Medication Management, Motor, Pain  PT Balance, Pain, Safety, Motor, Endurance, Skin Integrity  OT Balance, Sensory, Endurance, Safety  SLP    TR         Basic ADL's: OT Grooming, Bathing, Dressing, Toileting     Advanced  ADL's: OT Simple Meal Preparation     Transfers: PT Bed Mobility, Bed to Chair, Car, Occupational psychologist, Research scientist (life sciences): PT Ambulation, Psychologist, prison and probation services, Stairs     Additional Impairments: OT    SLP        TR      Anticipated Outcomes Item Anticipated Outcome  Self Feeding    Swallowing      Basic self-care  Mod I  Toileting  Mod I   Bathroom Transfers SBA  Bowel/Bladder  manage bowel and bladder  Transfers  mod I  Locomotion  mod I short distance, supervision community distances  Communication     Cognition     Pain  less than 4  Safety/Judgment  no falls   Therapy Plan: PT Intensity: Minimum of 1-2 x/day ,45 to 90 minutes PT Frequency: 5 out of 7 days PT Duration Estimated Length of Stay: 7-10 days OT Intensity: Minimum of 1-2 x/day, 45 to 90 minutes OT Frequency: 5 out of 7 days OT Duration/Estimated Length of Stay: 7-10 days     Team Interventions: Nursing Interventions Patient/Family Education, Medication Management, Psychosocial Support, Bladder Management, Bowel Management, Disease Management/Prevention, Pain Management, Discharge Planning  PT interventions Ambulation/gait training, Discharge planning, DME/adaptive equipment instruction, Functional mobility training, Pain management, UE/LE Strength taining/ROM, Warden/ranger, Community reintegration, Neuromuscular re-education, Patient/family  education, Therapeutic Exercise, Stair training, UE/LE Coordination activities, Wheelchair propulsion/positioning  OT Interventions Warden/ranger, Self Care/advanced ADL retraining, Neuromuscular re-education, DME/adaptive equipment instruction, Therapeutic Exercise, UE/LE Strength taining/ROM, Patient/family education, Community reintegration, Discharge planning, Functional mobility training, Therapeutic Activities, UE/LE Coordination activities  SLP Interventions    TR Interventions    SW/CM Interventions Discharge Planning, Psychosocial Support, Patient/Family Education   Barriers to Discharge MD  Medical stability  Nursing Decreased caregiver support, Home environment access/layout, Neurogenic Bowel & Bladder, Lack of/limited family support, Weight home to [facility] 1 level with level entry. Has to step a curb from parking lot to entry  PT Home environment access/layout, Other (comments) back precautions, [place] of [place]  OT      SLP      SW       Team Discharge Planning: Destination: PT-Home ,OT- Home , SLP-  Projected Follow-up: PT-Outpatient PT, OT-  Outpatient OT, SLP-  Projected Equipment Needs: PT-To be determined, OT- 3 in 1 bedside comode, Tub/shower seat, To be determined, SLP-  Equipment Details: PT- , OT-SPC, RW Patient/family involved in discharge planning: PT- Patient,  OT-Patient, SLP-   MD ELOS: 7-10 days Medical Rehab Prognosis:  Excellent Assessment: The patient has been admitted for CIR therapies with the diagnosis of thoracic myelopathy. The team will be addressing functional mobility, strength, stamina, balance, safety, adaptive techniques and equipment, self-care, bowel and bladder mgt, patient and caregiver education, NMR, community reentry, pain control. Goals have been set at mod I for mobility and self-care tasks. Anticipated discharge destination is home with spouse.        See  Team Conference Notes for weekly updates to the plan of care

## 2022-10-30 NOTE — Progress Notes (Signed)
Inpatient Rehabilitation  Patient information reviewed and entered into eRehab system by Charmin Aguiniga M. Taiesha Bovard, M.A., CCC/SLP, PPS Coordinator.  Information including medical coding, functional ability and quality indicators will be reviewed and updated through discharge.    

## 2022-10-30 NOTE — Progress Notes (Signed)
IP Rehab Bowel Program Documentation   Bowel Program Start time [phone]   Dig Stim Indicated? Yes  Dig Stim Prior to Suppository or mini Enema X 1   Output from dig stim: none  Ordered intervention: Suppository Yes , mini enema No ,   Repeat dig stim after Suppository or Mini enema  X {Numbers; 1-5:17750},  Output? {Desc; minimal/small/moderate/large/very large:110034}   Bowel Program Complete? {YES/NO:21197}, handoff given ***  Patient Tolerated? {YES/NO:21197}

## 2022-10-30 NOTE — Evaluation (Signed)
Physical Therapy Assessment and Plan  Patient Details  Name: Mitchell Morgan MRN: [id number] Date of Birth: 1945-06-23  PT Diagnosis: Difficulty walking, Impaired sensation, Muscle spasms, Muscle weakness, Paraplegia, and Pain in joint Rehab Potential: Excellent ELOS: 7-10 days   Today's Date: 10/30/2022 PT Individual Time: 1045-1200 PT Individual Time Calculation (min): 75 min    Hospital Problem: Principal Problem:   Thoracic myelopathy   Past Medical History:  Past Medical History:  Diagnosis Date   Basal cell carcinoma    left cheek   BPH (benign prostatic hyperplasia)    Diverticulosis    GERD (gastroesophageal reflux disease)    High cholesterol    Hip pain, bilateral    left > right   Hypertension    Insomnia    Knee pain, bilateral    Prediabetes    Rotator cuff disorder, left    Past Surgical History:  Past Surgical History:  Procedure Laterality Date   DECOMPRESSIVE LUMBAR LAMINECTOMY LEVEL 1 N/A 10/15/2022   Procedure: DECOMPRESSIVE thoracic laminectomy THORACIC TEN -ELEVEN;  Surgeon: Julio Sicks, MD;  Location: [facility] OR;  Service: Neurosurgery;  Laterality: N/A;    Assessment & Plan Clinical Impression: Patient is a 77 year old male with history of HTN, obesity, back pain with falls, unsteady gait progressing to difficulty voiding and incontinence due to lack of perineal sensation who was originally admitted on 10/13/22 for for work up. He reports using a walker for ambulation since April. 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 Symptoms felt to be due to thoracic stenosis with myelopathy, he was started on decadron and underwent T10- T11 decompressive laminectomy by Dr. Jordan Likes.  PT/OT initiated with CIR recommendations but while awaiting authorization, patient progressed to  min guard assist and was discharged to home on 06/06.  At home he had continued difficulty with poor balance and had difficulty emptying his bowels.    He was readmitted on 06/09 with new onset of upper abdominal pain, no BM X 3 days and decrease in po intake and difficulty walking. He was found to be hyponatremia with Na 132 and CT abdomen/pelvis done revealing moderate thickening about duodenum with adjacent edema suspicious for duodenitis and PUD, mild gastric distension likely due to duodenal inflammation w/suspicion for duodenitis or PUD, enlarged prostate 6.4 cm (transverse span) and no post op T10- T11 decompression without post op fluid collection. He was admitted for work up, started on Protonix and reported inability to void as well as issues with constipation. 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 Patient transferred to CIR on 10/29/2022 .   Patient currently requires min with mobility secondary to muscle weakness, impaired timing and sequencing and unbalanced muscle activation, and decreased standing balance, decreased postural control, decreased balance strategies, and difficulty maintaining precautions.  Prior to hospitalization, patient was independent  with mobility and lived with Spouse in a Other(Comment) (condo) home.  Home access is 1 (son's home)Elevator.  Patient will benefit from skilled PT intervention to maximize safe functional mobility, minimize fall risk, and decrease caregiver burden for planned discharge home with 24 hour supervision.  Anticipate patient will benefit from follow up OP at discharge.  PT - End of Session Activity Tolerance: Tolerates 30+ min activity  with multiple rests Endurance Deficit: Yes Endurance Deficit Description: required rest breaks throughout PT Assessment Rehab Potential (ACUTE/IP ONLY): Excellent PT Barriers to Discharge: Home  environment access/layout;Other (comments) PT Barriers to Discharge Comments: back precautions, [place] out of [place] PT Patient demonstrates impairments in the following area(s): Balance;Pain;Safety;Motor;Endurance;Skin Integrity PT Transfers Functional Problem(s): Bed Mobility;Bed to Chair;Car;Furniture PT Locomotion Functional Problem(s): Ambulation;Wheelchair Mobility;Stairs PT Plan PT Intensity: Minimum of 1-2 x/day ,45 to 90 minutes PT Frequency: 5 out of 7 days PT Duration Estimated Length of Stay: 7-10 days PT Treatment/Interventions: Ambulation/gait training;Discharge planning;DME/adaptive equipment instruction;Functional mobility training;Pain management;UE/LE Strength taining/ROM;Balance/vestibular training;Community reintegration;Neuromuscular re-education;Patient/family education;Therapeutic Exercise;Stair training;UE/LE Coordination activities;Wheelchair propulsion/positioning PT Transfers Anticipated Outcome(s): mod I PT Locomotion Anticipated Outcome(s): mod I short distance, supervision community distances PT Recommendation Follow Up Recommendations: Outpatient PT Patient destination: Home Equipment Recommended: To be determined   PT Evaluation Precautions/Restrictions Precautions Precautions: Fall;Back Precaution Comments: pt able to state 3/3; required no cues to adhere, Back precautions posted above bed Restrictions Weight Bearing Restrictions: No General   Vital Signs Pain Pain Assessment Pain Scale: 0-10 Pain Score: 3  Pain Type: Acute pain Pain Location: Back Pain Orientation: Lower Pain Onset: On-going Pain Intervention(s): Repositioned Pain Interference Pain Interference Pain Effect on Sleep: 1. Rarely or not at all Pain Interference with Therapy Activities: 1. Rarely or not at all Pain Interference with Day-to-Day Activities: 2. Occasionally Home Living/Prior Functioning Home Living Available Help at Discharge: Family;Available 24 hours/day  (Supervision level assist in [place] or [place], more family support here) Type of Home: Other(Comment) (condo) Home Access: Actor of Steps: 1 (son's home) Entrance Stairs-Rails: None Home Layout: One level Bathroom Shower/Tub: Health visitor: Standard Bathroom Accessibility: Yes Additional Comments: Spends part of time in family home in [place] where Son is living. Bathroom is handicap accessible with walk-in shower with built-in with comfort height toilet. 1 step to enter. Was independent prior although after April 1st, began to have difficulty with walking/mobility and required the use of AD (used cane initially and then RW). Hx of 3 falls in the past 6 months.  Lives With: Spouse Prior Function Level of Independence: Independent with gait;Independent with transfers  Able to Take Stairs?: Yes Driving: Yes Vocation: Retired Vision/Perception  Vision - History Ability to See in Adequate Light: 0 Adequate Perception Perception: Within Functional Limits Praxis Praxis: Intact  Cognition Overall Cognitive Status: Within Functional Limits for tasks assessed Arousal/Alertness: Awake/alert Orientation Level: Oriented X4 Day of Week: Correct Memory: Impaired Awareness: Impaired Problem Solving: Impaired Safety/Judgment: Appears intact Sensation Sensation Light Touch: Impaired Detail Peripheral sensation comments: BLE diminished, ~40% Light Touch Impaired Details: Impaired RLE;Impaired LLE Hot/Cold: Impaired by gross assessment Proprioception: Appears Intact Stereognosis: Impaired by gross assessment Additional Comments: Pt reports decreased sensation in BLE from feet to hips. Reports occassionally hands become tingling at night when sleeping (possible CTS?) Coordination Gross Motor Movements are Fluid and Coordinated: No Motor  Motor Motor: Paraplegia Motor - Skilled Clinical Observations: 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   Trunk/Postural  Assessment  Cervical Assessment Cervical Assessment: Within Functional Limits Thoracic Assessment Thoracic Assessment: Exceptions to [facility] (back precautions) Lumbar Assessment Lumbar Assessment: Exceptions to [facility] (back precautions) Postural Control Postural Control: Deficits on evaluation Trunk Control: delayed due to recent back surgery  Balance Balance Balance Assessed: Yes Static Sitting Balance Static Sitting - Balance Support: No upper extremity supported;Feet supported Static Sitting - Level of Assistance: 6: Modified independent (Device/Increase time) Dynamic Sitting Balance Dynamic Sitting - Balance Support: No upper extremity  supported;During functional activity;Feet supported Dynamic Sitting - Level of Assistance: 5: Stand by assistance Sitting balance - Comments: sitting EOB and at sink during dressing/grooming task Static Standing Balance Static Standing - Balance Support: Bilateral upper extremity supported;During functional activity Static Standing - Level of Assistance: 4: Min assist Dynamic Standing Balance Dynamic Standing - Balance Support: During functional activity;Left upper extremity supported;Right upper extremity supported Dynamic Standing - Level of Assistance: 4: Min assist Extremity Assessment  RUE Assessment RUE Assessment: Exceptions to [facility] Active Range of Motion (AROM) Comments: WFL General Strength Comments: shoulder flexion, abduction LUE Assessment LUE Assessment: Exceptions to [facility] Active Range of Motion (AROM) Comments: WFL in all ranges General Strength Comments: Shoulder flexion, abduction:  4/5, IR: 5/5, er: 3+/5, elbow flexion/extension: 5/5, functional gross grasp grasp. RLE Assessment RLE Assessment: Exceptions to [facility] RLE Strength Right Hip Flexion: 3-/5 Right Knee Flexion: 4-/5 Right Knee Extension: 4-/5 Right Ankle Dorsiflexion: 4/5 Right Ankle Plantar Flexion: 4/5 LLE Assessment LLE Assessment: Exceptions to [facility] LLE Strength Left  Hip Flexion: 3-/5 Left Knee Flexion: 3+/5 Left Knee Extension: 3-/5 Left Ankle Dorsiflexion: 4+/5 Left Ankle Plantar Flexion: 4+/5  Care Tool Care Tool Bed Mobility Roll left and right activity        Sit to lying activity        Lying to sitting on side of bed activity   Lying to sitting on side of bed assist level: the ability to move from lying on the back to sitting on the side of the bed with no back support.: Minimal Assistance - Patient > 75%     Care Tool Transfers Sit to stand transfer   Sit to stand assist level: Contact Guard/Touching assist    Chair/bed transfer   Chair/bed transfer assist level: Contact Guard/Touching assist     Toilet transfer   Assist Level: Contact Guard/Touching assist    Car transfer          Care Tool Locomotion Ambulation          Walk 10 feet activity         Walk 50 feet with 2 turns activity        Walk 150 feet activity        Walk 10 feet on uneven surfaces activity        Stairs          Walk up/down 1 step activity        Walk up/down 4 steps activity        Walk up/down 12 steps activity        Pick up small objects from floor        Wheelchair            Wheel 50 feet with 2 turns activity      Wheel 150 feet activity        Refer to Care Plan for Long Term Goals  SHORT TERM GOAL WEEK 1 PT Short Term Goal 1 (Week 1): =LTGs d/t ELOS  Recommendations for other services: None   Skilled Therapeutic Intervention Evaluation completed (see details above) with patient education regarding purpose of PT evaluation, PT POC and goals, therapy schedule, weekly team meetings, and other CIR information including safety plan and fall risk safety.Isolated motor and sensory testing in sitting.  Pt performed the below functional mobility tasks with the specified levels of skilled cuing and assistance. Pt able to perform STS and Stand pivot transfer with RW and CGA, min A for balance with  higher level  activities. Note some knee instability but no true knee buckling during this evaluation. Extended discussion about prognosis and rehab process, with pt also requesting to see imaging. Provided basic education on imaging so pt could visualize pathology. Pt remained in w/c at end of session, was left with all needs in reach and alarm active.   Mobility Bed Mobility Bed Mobility: Supine to Sit Supine to Sit: Minimal Assistance - Patient > 75% Transfers Transfers: Sit to Stand;Stand to Sit;Transfer Sit to Stand: Contact Guard/Touching assist Stand to Sit: Contact Guard/Touching assist Transfer (Assistive device): Rolling walker Locomotion  Gait Ambulation: Yes Gait Assistance: Minimal Assistance - Patient > 75% Gait Distance (Feet): 6 Feet (did not attempt distance gait d/t fatigue) Assistive device: Rolling walker Gait Assistance Details: Manual facilitation for weight shifting;Verbal cues for safe use of DME/AE;Verbal cues for precautions/safety Gait Gait: Yes Gait Pattern: Impaired Gait Pattern: Poor foot clearance - left;Poor foot clearance - right;Decreased trunk rotation;Decreased hip/knee flexion - right;Decreased hip/knee flexion - left Stairs / Additional Locomotion Stairs: Yes Stairs Assistance: Minimal Assistance - Patient > 75% Stair Management Technique: Two rails Number of Stairs: 8 Height of Stairs: 3 Wheelchair Mobility Wheelchair Mobility: Yes Wheelchair Assistance: Doctor, general practice: Both upper extremities Wheelchair Parts Management: Needs assistance Distance: 150   Discharge Criteria: Patient will be discharged from PT if patient refuses treatment 3 consecutive times without medical reason, if treatment goals not met, if there is a change in medical status, if patient makes no progress towards goals or if patient is discharged from hospital.  The above assessment, treatment plan, treatment alternatives and goals were discussed and  mutually agreed upon: by patient  Juluis Rainier 10/30/2022, 12:43 PM

## 2022-10-30 NOTE — Progress Notes (Signed)
Patient ID: Mitchell Morgan, male   DOB: 03-02-1946, 77 y.o.   MRN: [id number]  SW made efforts to make contact with [facility]  [phone] ext [phone] , but extension is incorrect. Staff will relay information for her to follow-up. SW sent email to [email or web address]) to introduce self, and inquire about service connection. SW waiting on follow-up.  SW left message for [name] to introduce self, explain role, and discuss discharge process.   *SW made efforts to meet with pt to complete assessment, but pt in PT session. SW will continue to make efforts to complete.  Mitchell Morgan, MSW, LCSWA Office: [phone] Cell: [phone] Fax: [phone]

## 2022-10-30 NOTE — Progress Notes (Signed)
PROGRESS NOTE   Subjective/Complaints: Pt had a good night. Thankful to be here. Reports a bm last night after suppository and another continent bowel movement this morning. Has been continent of bladder although sense of fullness is still altered somewhat  ROS: Patient denies fever, rash, sore throat, blurred vision, dizziness, nausea, vomiting, diarrhea, cough, shortness of breath or chest pain, joint or back/neck pain, headache, or mood change.    Objective:   DG Abd 1 View  Result Date: 10/29/2022 CLINICAL DATA:  Possible constipation EXAM: ABDOMEN - 1 VIEW COMPARISON:  10/25/2022 CT FINDINGS: Scattered large and small bowel gas is noted. Moderate retained fecal material is noted throughout the colon without obstructive change. The overall appearance is similar to that seen on recent CT. No free air is noted. Degenerative changes of lumbar spine are seen. IMPRESSION: Moderate retained fecal material stable from prior CT consistent with a degree of constipation. Electronically Signed   By: Alcide Clever M.D.   On: 10/29/2022 19:35   Recent Labs    10/30/22 0445  WBC 9.4  HGB 12.3*  HCT 35.9*  PLT 163   Recent Labs    10/30/22 0445  NA 133*  K 3.7  CL 98  CO2 27  GLUCOSE 104*  BUN 11  CREATININE 1.01  CALCIUM 8.3*    Intake/Output Summary (Last 24 hours) at 10/30/2022 0915 Last data filed at 10/30/2022 1610 Gross per 24 hour  Intake 600 ml  Output 700 ml  Net -100 ml     Pressure Injury 10/29/22 Coccyx Mid Stage 2 -  Partial thickness loss of dermis presenting as a shallow open injury with a red, pink wound bed without slough. small area over coccyx (Active)  10/29/22 1524  Location: Coccyx  Location Orientation: Mid  Staging: Stage 2 -  Partial thickness loss of dermis presenting as a shallow open injury with a red, pink wound bed without slough.  Wound Description (Comments): small area over coccyx  Present on  Admission: Yes    Physical Exam: Vital Signs Blood pressure 118/68, pulse 73, temperature (!) 97.5 F (36.4 C), resp. rate 18, height 5\' 9"  (1.753 m), weight 89.4 kg, SpO2 97 %.  General: Alert and oriented x 3, No apparent distress HEENT: Head is normocephalic, atraumatic, PERRLA, EOMI, sclera anicteric, oral mucosa pink and moist, dentition intact, ext ear canals clear,  Neck: Supple without JVD or lymphadenopathy Heart: Reg rate and rhythm. No murmurs rubs or gallops Chest: CTA bilaterally without wheezes, rales, or rhonchi; no distress Abdomen: Soft, non-tender, non-distended, bowel sounds positive. Extremities: No clubbing, cyanosis, or edema. Pulses are 2+ Psych: Pt's affect is appropriate. Pt is cooperative Skin: Thoracic incision CDI, foam dressing on sacrum Neuro:  Alert and oriented x 3. Normal insight and awareness. Intact Memory. Normal language and speech. Cranial nerve exam unremarkable. MMT: BUE 5/5 except for left shoulder which is 4/5. BLE: HF 4-, KE 4, ADF/APF 4+ to 5/5. Decreased sense of LT and pain below the T12 level. DTR's 1+ in LE's. No abnl resting tone.   Musculoskeletal: Left shoulder limited with ABD/IR due to pain/RTC. Tenderness along thoracic spine/surgical site. Otherwise WNL    Assessment/Plan:  1. Functional deficits which require 3+ hours per day of interdisciplinary therapy in a comprehensive inpatient rehab setting. Physiatrist is providing close team supervision and 24 hour management of active medical problems listed below. Physiatrist and rehab team continue to assess barriers to discharge/monitor patient progress toward functional and medical goals  Care Tool:  Bathing              Bathing assist       Upper Body Dressing/Undressing Upper body dressing        Upper body assist      Lower Body Dressing/Undressing Lower body dressing            Lower body assist       Toileting Toileting    Toileting assist        Transfers Chair/bed transfer  Transfers assist           Locomotion Ambulation   Ambulation assist              Walk 10 feet activity   Assist           Walk 50 feet activity   Assist           Walk 150 feet activity   Assist           Walk 10 feet on uneven surface  activity   Assist           Wheelchair     Assist               Wheelchair 50 feet with 2 turns activity    Assist            Wheelchair 150 feet activity     Assist          Blood pressure 118/68, pulse 73, temperature (!) 97.5 F (36.4 C), resp. rate 18, height 5\' 9"  (1.753 m), weight 89.4 kg, SpO2 97 %.  Medical Problem List and Plan: 1. 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 Dutch Quint on 10/15/2022.  Patient with continued lower extremity numbness and weakness and decreased control bowel bladder.             -patient may shower             -ELOS/Goals:  7-10 days, Mod I PT/OT, no SLP             -Patient is beginning CIR therapies today including PT and OT  2.  Antithrombotics: -DVT/anticoagulation:  Mechanical: Sequential compression devices, below knee Bilateral lower extremities             -antiplatelet therapy: N/A 3. Pain Management: Tylenol 500 mg/hs.  --continue Flexeril and Hydrocodone prn.  --resume Voltaren gel to bilateral knees and left shoulder for chronic pain.  --LEFT knee injected a month ago at Dr. Nilsa Nutting office.--observe for gait/wb tolerance with staff/therapy  4. Mood/Behavior/Sleep:  LCSW to follow for evaluation and support.              -antipsychotic agents: N/A             --continue Ambien prn for insomnia.  5. Neuropsych/cognition: This patient is capable of making decisions on his own behalf. 6. Skin/Wound Care: Monitor incision for healing.  7. Fluids/Electrolytes/Nutrition: encourage po  I personally reviewed the patient's labs  today.   8. Duodenitis/Evidence of PUD: Will increase PPI to BID for now. .  9. HTN: Monitor BP TID--continue atenolol 50mg  daily  and amlodipine 10mg  daily. 6/14 bp controlled  10. Suboptimal B 12 level @ 195: Continue supplement.  11. Neurogenic bowel/constipation:  Continue Miralax and Senna S 2 tabs BID-->am/noon              -- KUB with moderate stool burden  --Pt is fine with a daily suppository after supper. Can continue until he's has more consistent BM's. He had results last night and had another BM on his own this am. 12. Hyponatremia: Na has been ranging from 128 to 133 range since 05/31.              --changed diet to regular. (No hx of CAD documented)  6/14 sodium 133--continue current plan 13. Leukocytosis: Has been on prednisone/steroids for 4-5 weeks followed by decadron with last admission.               --no fevers, WBC now normal.  14. Abnormal LFTs: --LFT's now WNL  15. Pre diabetes w/stress induced/steroid induced hyperglycemia:                 -Hgb A1C 5.9 16. Neurogenic bladder/BPH: Has been toileting himself but now with frequency/voiding small amounts.   -pt reports improved sense of bladder filling pressure  -continue timed toileting --Continue flomax 0.4mg  daily -I don't see any PVR's recorded yet, but pt mentioned one was done last night    LOS: 1 days A FACE TO FACE EVALUATION WAS PERFORMED  Ranelle Oyster 10/30/2022, 9:15 AM

## 2022-10-31 DIAGNOSIS — G4700 Insomnia, unspecified: Secondary | ICD-10-CM

## 2022-10-31 DIAGNOSIS — M4714 Other spondylosis with myelopathy, thoracic region: Secondary | ICD-10-CM | POA: Diagnosis not present

## 2022-10-31 MED ORDER — ZOLPIDEM TARTRATE 5 MG PO TABS
10.0000 mg | ORAL_TABLET | Freq: Every evening | ORAL | Status: DC | PRN
  Administered 2022-10-31 – 2022-11-05 (×6): 10 mg via ORAL
  Filled 2022-10-31 (×6): qty 2

## 2022-10-31 NOTE — Progress Notes (Signed)
PROGRESS NOTE   Subjective/Complaints:  Slept poorly overnight, had restless legs which he's had before; got some pain meds and that improved things, and he was able to rest some. Pain well controlled. LBM last night. Urinating well. Denies any other complaints or concerns today.   ROS: as per HPI. Denies CP, SOB, abd pain, N/V/D/C, or any other complaints at this time.     Objective:   DG Abd 1 View  Result Date: 10/29/2022 CLINICAL DATA:  Possible constipation EXAM: ABDOMEN - 1 VIEW COMPARISON:  10/25/2022 CT FINDINGS: Scattered large and small bowel gas is noted. Moderate retained fecal material is noted throughout the colon without obstructive change. The overall appearance is similar to that seen on recent CT. No free air is noted. Degenerative changes of lumbar spine are seen. IMPRESSION: Moderate retained fecal material stable from prior CT consistent with a degree of constipation. Electronically Signed   By: Alcide Clever M.D.   On: 10/29/2022 19:35   Recent Labs    10/30/22 0445  WBC 9.4  HGB 12.3*  HCT 35.9*  PLT 163   Recent Labs    10/30/22 0445  NA 133*  K 3.7  CL 98  CO2 27  GLUCOSE 104*  BUN 11  CREATININE 1.01  CALCIUM 8.3*    Intake/Output Summary (Last 24 hours) at 10/31/2022 1106 Last data filed at 10/31/2022 1610 Gross per 24 hour  Intake 960 ml  Output --  Net 960 ml     Pressure Injury 10/29/22 Coccyx Mid Stage 2 -  Partial thickness loss of dermis presenting as a shallow open injury with a red, pink wound bed without slough. small area over coccyx (Active)  10/29/22 1524  Location: Coccyx  Location Orientation: Mid  Staging: Stage 2 -  Partial thickness loss of dermis presenting as a shallow open injury with a red, pink wound bed without slough.  Wound Description (Comments): small area over coccyx  Present on Admission: Yes    Physical Exam: Vital Signs Blood pressure 110/69, pulse 86,  temperature 97.6 F (36.4 C), temperature source Oral, resp. rate 18, height 5\' 9"  (1.753 m), weight 89.4 kg, SpO2 95 %.  General: No apparent distress, well appearing HEENT: Head is normocephalic, atraumatic, PERRLA, EOMI, sclera anicteric, oral mucosa pink and moist, dentition intact Neck: Supple without JVD Heart: Reg rate and rhythm. No murmurs rubs or gallops Chest: CTA bilaterally without wheezes, rales, or rhonchi; no distress Abdomen: Soft, non-tender, non-distended, bowel sounds positive. Extremities: No clubbing, cyanosis, or edema. Pulses are 2+ Psych: Pt's affect is appropriate. Pt is cooperative Skin: Thoracic incision CDI, foam dressing on sacrum-- not reassessed  PRIOR EXAMS: Neuro:  Alert and oriented x 3. Normal insight and awareness. Intact Memory. Normal language and speech. Cranial nerve exam unremarkable. MMT: BUE 5/5 except for left shoulder which is 4/5. BLE: HF 4-, KE 4, ADF/APF 4+ to 5/5. Decreased sense of LT and pain below the T12 level. DTR's 1+ in LE's. No abnl resting tone.   Musculoskeletal: Left shoulder limited with ABD/IR due to pain/RTC. Tenderness along thoracic spine/surgical site. Otherwise WNL    Assessment/Plan: 1. Functional deficits which require 3+ hours per  day of interdisciplinary therapy in a comprehensive inpatient rehab setting. Physiatrist is providing close team supervision and 24 hour management of active medical problems listed below. Physiatrist and rehab team continue to assess barriers to discharge/monitor patient progress toward functional and medical goals  Care Tool:  Bathing    Body parts bathed by patient: Right arm, Left arm, Abdomen, Chest, Front perineal area, Buttocks, Right upper leg, Left upper leg, Right lower leg, Left lower leg, Face         Bathing assist Assist Level: Set up assist     Upper Body Dressing/Undressing Upper body dressing   What is the patient wearing?: Pull over shirt    Upper body assist  Assist Level: Set up assist    Lower Body Dressing/Undressing Lower body dressing      What is the patient wearing?: Pants, Underwear/pull up     Lower body assist Assist for lower body dressing: Set up assist     Toileting Toileting    Toileting assist Assist for toileting: Contact Guard/Touching assist     Transfers Chair/bed transfer  Transfers assist     Chair/bed transfer assist level: Contact Guard/Touching assist     Locomotion Ambulation   Ambulation assist      Assist level: Minimal Assistance - Patient > 75% Assistive device: Walker-rolling Max distance: 30   Walk 10 feet activity   Assist  Walk 10 feet activity did not occur: Safety/medical concerns (endurance after OT eval)  Assist level: Contact Guard/Touching assist Assistive device: Walker-rolling   Walk 50 feet activity   Assist Walk 50 feet with 2 turns activity did not occur: Safety/medical concerns         Walk 150 feet activity   Assist Walk 150 feet activity did not occur: Safety/medical concerns         Walk 10 feet on uneven surface  activity   Assist Walk 10 feet on uneven surfaces activity did not occur: Safety/medical concerns         Wheelchair     Assist Is the patient using a wheelchair?: Yes Type of Wheelchair: Manual    Wheelchair assist level: Supervision/Verbal cueing Max wheelchair distance: 150    Wheelchair 50 feet with 2 turns activity    Assist        Assist Level: Supervision/Verbal cueing   Wheelchair 150 feet activity     Assist      Assist Level: Supervision/Verbal cueing   Blood pressure 110/69, pulse 86, temperature 97.6 F (36.4 C), temperature source Oral, resp. rate 18, height 5\' 9"  (1.753 m), weight 89.4 kg, SpO2 95 %.  Medical Problem List and Plan: 1. 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 Dutch Quint on 10/15/2022.  Patient with  continued lower extremity numbness and weakness and decreased control bowel bladder.             -patient may shower             -ELOS/Goals:  7-10 days, Mod I PT/OT, no SLP             -Patient is beginning CIR therapies today including PT and OT  2.  Antithrombotics: -DVT/anticoagulation:  Mechanical: Sequential compression devices, below knee Bilateral lower extremities             -antiplatelet therapy: ASA 81mg  QD 3. Pain Management: Tylenol 500 mg/hs + PRN. Norco PRN.  --continue Flexeril and Hydrocodone prn.  --resume Voltaren gel to  bilateral knees and left shoulder for chronic pain.  --LEFT knee injected a month ago at Dr. Nilsa Nutting office.--observe for gait/wb tolerance with staff/therapy  4. Mood/Behavior/Sleep:  LCSW to follow for evaluation and support.              -antipsychotic agents: N/A --continue Ambien prn for insomnia.--> 10/31/22 increased to 10mg  PRN since that's his home dose 5. Neuropsych/cognition: This patient is capable of making decisions on his own behalf. 6. Skin/Wound Care: Monitor incision for healing.  7. Fluids/Electrolytes/Nutrition: encourage po, monitor routine labs.  8. Duodenitis/Evidence of PUD: Will increase protonix 40mg  to BID for now. .  9. HTN: Monitor BP TID--continue atenolol 50mg  daily and amlodipine 10mg  daily. -6/14-15/24 bp controlled  Vitals:   10/29/22 1536 10/29/22 2021 10/30/22 0508 10/30/22 1251  BP: 123/69 124/72 118/68 107/71   10/30/22 2002 10/31/22 0532  BP: 114/70 110/69    10. Suboptimal B 12 level @ 195: Continue supplement.  11. Neurogenic bowel/constipation:  Continue Miralax QD and Senna S 2 tabs BID-->am/noon             -- KUB with moderate stool burden  -Pt is fine with a daily suppository after supper. Can continue until he's has more consistent BM's. He had results last night and had another BM on his own this am. -10/31/22 LBM last night, cont regimen 12. Hyponatremia: Na has been ranging from 128 to 133 range since  05/31.              --changed diet to regular. (No hx of CAD documented)  -6/14 sodium 133--continue current plan 13. Leukocytosis: Has been on prednisone/steroids for 4-5 weeks followed by decadron with last admission.               --no fevers, WBC now normal.  14. Abnormal LFTs: LFT's now WNL  15. Pre diabetes w/stress induced/steroid induced hyperglycemia: Hgb A1C 5.9. Monitor, no need for insulin or CBG checks for now.  16. Neurogenic bladder/BPH: Has been toileting himself but now with frequency/voiding small amounts.   -pt reports improved sense of bladder filling pressure  -continue timed toileting --Continue flomax 0.4mg  daily    LOS: 2 days A FACE TO FACE EVALUATION WAS PERFORMED  [address] 10/31/2022, 11:06 AM

## 2022-10-31 NOTE — Progress Notes (Signed)
IP Rehab Bowel Program Documentation   Bowel Program Start time [id number]  Dig Stim Indicated? Yes  Dig Stim Prior to Suppository or mini Enema X 1   Output from dig stim: Minimal  Ordered intervention: Suppository Yes , mini enema No ,   Repeat dig stim after Suppository or Mini enema  X {Numbers; 1-5:17750},  Output? {Desc; minimal/small/moderate/large/very large:110034}   Bowel Program Complete? {YES/NO:21197}, handoff given ***  Patient Tolerated? {YES/NO:21197}

## 2022-10-31 NOTE — Progress Notes (Signed)
Physical Therapy Session Note  Patient Details  Name: Mitchell Morgan MRN: [id number] Date of Birth: Jan 13, 1946  Today's Date: 10/31/2022 PT Individual Time: 1445-1530 and 800-915 PT Individual Time Calculation (min): 45 min and 75 min  Short Term Goals: Week 1:  PT Short Term Goal 1 (Week 1): =LTGs d/t ELOS  Skilled Therapeutic Interventions/Progress Updates:   First session:  Pt presents supine in bed and agreeable to therapy.  Pt transfers sup to sitting w/ supervision.  Pt needs some reminders re: spinal precautions even though states 3/3!  Pt sat EOB to don socks and shoes in figure-4 position.  Pt transfers sit to stand throughout session w/ CGA although cues for forward scoot especially from lower surfaces.  Pt performed SPT w/ RW bed > w/c w/ CGA and cueing for maintaining position in RW and foot clearance for safety.  Pt wheeled to dayroom and amb w/ RW and min to Nu-step.  Pt performed x 8' then 5' at Level 3 for a total of 362 steps and average spm of 26.  Pt given cues for maintaining neutral hip rotation on L.  Pt amb multiple trials of 30' w/ RW in dayroom.  Pt performed standing on Airex cushion x 2" w/o UE support, as well as reaching for cones (stacking and unstacking outside of BOS.  Pt also performed 3 x 8 partial squats (pt states pain to L knee w/ audible knocking), and thenreaching for cones w/ bilateral UE s standing in a near tandem foot pattern.  Pt returned to room and amb w/ RW x 30' to recliner and remained sitting w/ chair alarm on and all needs in reach.  Second session:  Pt presents sitting in recliner and agreeable to therapy.  Pt transfers sit to stand w/ CGA and cues.  Pt amb > 150' w/ RW and min to CGA, improved BOS and turns w/o LOB.  Pt amb multiple trials of 180' w/ RW and CGA.  Pt negotiated cone obstacle course w/o LOB and CGA, occasional cues for foot placement to maintain BOS especially as fatigues.  Pt amb x 50' including turn to return to seat w/o AD and  mod/min A, verbal cues for arm swing, posture and BOS.  Pt amb > 150' w/ RW and CGA, finishing gait into room w/o AD and noted fatigue w/ LOB forward.  Pt returned to recliner w/ chair alarm on and all needs in reach.      Therapy Documentation Precautions:  Precautions Precautions: Fall, Back Precaution Comments: pt able to state 3/3; required no cues to adhere, Back precautions posted above bed Restrictions Weight Bearing Restrictions: No General:   Vital Signs: Therapy Vitals Temp: 98 F (36.7 C) Temp Source: Oral Pulse Rate: 86 Resp: 18 BP: 109/74 Patient Position (if appropriate): Sitting Oxygen Therapy SpO2: 93 % Pain:0/10, c/o some soreness w/ knees w/ activity.       Therapy/Group: Individual Therapy  Lucio Edward 10/31/2022, 3:47 PM

## 2022-10-31 NOTE — Progress Notes (Signed)
Occupational Therapy Session Note  Patient Details  Name: Mitchell Morgan MRN: [id number] Date of Birth: 04-Mar-1946  Today's Date: 10/31/2022 OT Individual Time: 1010-1050 OT Individual Time Calculation (min): 40 min    Short Term Goals: Week 1:  OT Short Term Goal 1 (Week 1): STG = LTGS (d/t ELOS)  Skilled Therapeutic Interventions/Progress Updates:    Patient seated EOB with NT at the time of arrival, Patient in agreement with completing skilled OT to improve functional balance, and functional task performance. The pt had a pain response of 3.5 on a 0-10 scale.  The pt went on to indicated that he didn;t rest well secondary to twitching with his L leg primary. The pt was able to ambulate to the gym using the RW for additional balance with opportunities for rest breaks as needed.  The pt was able to complete shower transfers from the w/c to the shower bench with CGA.  The pt returned to the gym and was able to complete elbow extension and bicep curls using a 3lb dumb bell 2 sets of 10 with rest breaks as needed. The pt required 1 rest break.  The pt was transported back to his hall and was permitted with an opportunity to ambulate to his room using the RW for additional balance.  The pt required vc's for repositioning his feet to reduce his incidence for fall. The pt was able to walk to the recliner using his RW and position himself safely.  The call light and bedside table were within reach and all additional needs were addressed prior to exiting the room.   Therapy Documentation Precautions:  Precautions Precautions: Fall, Back Precaution Comments: pt able to state 3/3; required no cues to adhere, Back precautions posted above bed Restrictions Weight Bearing Restrictions: No General:   Vital Signs:   Pain:   ADL: ADL Eating: Set up Where Assessed-Eating: Bed level Grooming: Setup Where Assessed-Grooming: Sitting at sink Upper Body Bathing: Setup Where Assessed-Upper Body Bathing:  Shower Lower Body Bathing: Setup, Supervision/safety (VC to maintain back precautions) Where Assessed-Lower Body Bathing: Shower Upper Body Dressing: Setup Where Assessed-Upper Body Dressing: Wheelchair Where Assessed-Lower Body Dressing: Wheelchair Toileting: Contact guard Where Assessed-Toileting: Bedside Commode, Teacher, adult education: Minimal Dentist Method: Proofreader: Bedside commode, Acupuncturist: Minimal assistance, Minimal cueing Film/video editor Method: Designer, industrial/product: Grab bars, Scientist, research (physical sciences)   Exercises:   Other Treatments:     Therapy/Group: Individual Therapy  [name] 10/31/2022, 1:02 PM

## 2022-10-31 NOTE — Progress Notes (Signed)
Occupational Therapy Session Note  Patient Details  Name: Mitchell Morgan MRN: [id number] Date of Birth: 1945/09/11  Today's Date: 10/31/2022 OT Individual Time: 1010-1050 OT Individual Time Calculation (min): 40 min    Short Term Goals: Week 1:  OT Short Term Goal 1 (Week 1): STG = LTGS (d/t ELOS)  Skilled Therapeutic Interventions/Progress Updates:    Pt received sitting in recliner.  No c/o pain at time of treatment.  Pt receptive to OT session and was able to recall back precautions (BLT). Pt agreeable to go to gym and functional mobility of 130ft using RW and CGA.  Cueing for increasing narrow base of support with functional mobility.  Pt able to complete 3x10 hip abduction activity in standing using cone to increase motor control during hip flexion.  Pt required rest breaks between sets and tactile cues for quads of LLE to increase ability of clearance of his foot from the cone.  Pt completed functional mobility activity to increase base of support with use of RW with CGA for decreased fall risk and increased independence with ADLS.  Functional mobility back to room 157ft using RW and CGA.  Pt left supine in bed with call bell in reach, bed alarm set and all needs met.  Therapy Documentation Precautions:  Precautions Precautions: Fall, Back Precaution Comments: pt able to state 3/3; required no cues to adhere, Back precautions posted above bed Restrictions Weight Bearing Restrictions: No General:   Vital Signs:   Pain:   ADL: ADL Eating: Set up Where Assessed-Eating: Bed level Grooming: Setup Where Assessed-Grooming: Sitting at sink Upper Body Bathing: Setup Where Assessed-Upper Body Bathing: Shower Lower Body Bathing: Setup, Supervision/safety (VC to maintain back precautions) Where Assessed-Lower Body Bathing: Shower Upper Body Dressing: Setup Where Assessed-Upper Body Dressing: Wheelchair Where Assessed-Lower Body Dressing: Wheelchair Toileting: Contact guard Where  Assessed-Toileting: Bedside Commode, Teacher, adult education: Minimal Dentist Method: Proofreader: Bedside commode, Acupuncturist: Minimal assistance, Minimal cueing Film/video editor Method: Designer, industrial/product: Grab bars, Scientist, research (physical sciences)   Exercises:   Other Treatments:     Therapy/Group: Individual Therapy  Liam Graham 10/31/2022, 12:26 PM

## 2022-11-01 DIAGNOSIS — M4714 Other spondylosis with myelopathy, thoracic region: Secondary | ICD-10-CM | POA: Diagnosis not present

## 2022-11-01 DIAGNOSIS — G4700 Insomnia, unspecified: Secondary | ICD-10-CM | POA: Diagnosis not present

## 2022-11-01 NOTE — Progress Notes (Signed)
IP Rehab Bowel Program Documentation   Bowel Program Start time [phone]   Dig Stim Indicated? Yes  Dig Stim Prior to Suppository or mini Enema X 1   Output from dig stim: none   Ordered intervention: Suppository Yes , mini enema No ,   Repeat dig stim after Suppository or Mini enema  X 1,  Output? Moderate   Bowel Program Complete? Yes , handoff given Eduard Clos, LPN   Patient Tolerated? Yes

## 2022-11-01 NOTE — Progress Notes (Signed)
PROGRESS NOTE   Subjective/Complaints:  Slept better overnight. Pain well controlled. LBM last night. Urinating well. Denies any other complaints or concerns today.   ROS: as per HPI. Denies CP, SOB, abd pain, N/V/D/C, or any other complaints at this time.     Objective:   No results found. Recent Labs    10/30/22 0445  WBC 9.4  HGB 12.3*  HCT 35.9*  PLT 163   Recent Labs    10/30/22 0445  NA 133*  K 3.7  CL 98  CO2 27  GLUCOSE 104*  BUN 11  CREATININE 1.01  CALCIUM 8.3*    Intake/Output Summary (Last 24 hours) at 11/01/2022 1059 Last data filed at 11/01/2022 0906 Gross per 24 hour  Intake 1078 ml  Output 450 ml  Net 628 ml     Pressure Injury 10/29/22 Coccyx Mid Stage 2 -  Partial thickness loss of dermis presenting as a shallow open injury with a red, pink wound bed without slough. small area over coccyx (Active)  10/29/22 1524  Location: Coccyx  Location Orientation: Mid  Staging: Stage 2 -  Partial thickness loss of dermis presenting as a shallow open injury with a red, pink wound bed without slough.  Wound Description (Comments): small area over coccyx  Present on Admission: Yes    Physical Exam: Vital Signs Blood pressure 114/68, pulse 69, temperature 98 F (36.7 C), temperature source Oral, resp. rate 18, height 5\' 9"  (1.753 m), weight 89.4 kg, SpO2 94 %.  General: No apparent distress, well appearing. Laying in bed HEENT: Head is normocephalic, atraumatic, PERRLA, EOMI, sclera anicteric, oral mucosa pink and moist, dentition intact Neck: Supple without JVD Heart: Reg rate and rhythm. No murmurs rubs or gallops Chest: CTA bilaterally without wheezes, rales, or rhonchi; no distress Abdomen: Soft, non-tender, non-distended, bowel sounds positive. Extremities: No clubbing, cyanosis, or edema. Pulses are 2+ Psych: Pt's affect is appropriate. Pt is cooperative   PRIOR EXAMS: Skin: Thoracic  incision CDI, foam dressing on sacrum Neuro:  Alert and oriented x 3. Normal insight and awareness. Intact Memory. Normal language and speech. Cranial nerve exam unremarkable. MMT: BUE 5/5 except for left shoulder which is 4/5. BLE: HF 4-, KE 4, ADF/APF 4+ to 5/5. Decreased sense of LT and pain below the T12 level. DTR's 1+ in LE's. No abnl resting tone.   Musculoskeletal: Left shoulder limited with ABD/IR due to pain/RTC. Tenderness along thoracic spine/surgical site. Otherwise WNL    Assessment/Plan: 1. Functional deficits which require 3+ hours per day of interdisciplinary therapy in a comprehensive inpatient rehab setting. Physiatrist is providing close team supervision and 24 hour management of active medical problems listed below. Physiatrist and rehab team continue to assess barriers to discharge/monitor patient progress toward functional and medical goals  Care Tool:  Bathing    Body parts bathed by patient: Right arm, Left arm, Abdomen, Chest, Front perineal area, Buttocks, Right upper leg, Left upper leg, Right lower leg, Left lower leg, Face         Bathing assist Assist Level: Set up assist     Upper Body Dressing/Undressing Upper body dressing   What is the patient wearing?: Pull over  shirt    Upper body assist Assist Level: Set up assist    Lower Body Dressing/Undressing Lower body dressing      What is the patient wearing?: Pants, Underwear/pull up     Lower body assist Assist for lower body dressing: Set up assist     Toileting Toileting    Toileting assist Assist for toileting: Contact Guard/Touching assist     Transfers Chair/bed transfer  Transfers assist     Chair/bed transfer assist level: Contact Guard/Touching assist     Locomotion Ambulation   Ambulation assist      Assist level: Minimal Assistance - Patient > 75% Assistive device: Walker-rolling Max distance: 180   Walk 10 feet activity   Assist  Walk 10 feet activity did  not occur: Safety/medical concerns (endurance after OT eval)  Assist level: Contact Guard/Touching assist Assistive device: Walker-rolling   Walk 50 feet activity   Assist Walk 50 feet with 2 turns activity did not occur: Safety/medical concerns  Assist level: Contact Guard/Touching assist Assistive device: Walker-rolling    Walk 150 feet activity   Assist Walk 150 feet activity did not occur: Safety/medical concerns  Assist level: Minimal Assistance - Patient > 75% Assistive device: Walker-rolling    Walk 10 feet on uneven surface  activity   Assist Walk 10 feet on uneven surfaces activity did not occur: Safety/medical concerns         Wheelchair     Assist Is the patient using a wheelchair?: Yes Type of Wheelchair: Manual    Wheelchair assist level: Supervision/Verbal cueing Max wheelchair distance: 150    Wheelchair 50 feet with 2 turns activity    Assist        Assist Level: Supervision/Verbal cueing   Wheelchair 150 feet activity     Assist      Assist Level: Supervision/Verbal cueing   Blood pressure 114/68, pulse 69, temperature 98 F (36.7 C), temperature source Oral, resp. rate 18, height 5\' 9"  (1.753 m), weight 89.4 kg, SpO2 94 %.  Medical Problem List and Plan: 1. 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 Dutch Quint on 10/15/2022.  Patient with continued lower extremity numbness and weakness and decreased control bowel bladder.             -patient may shower             -ELOS/Goals:  7-10 days, Mod I PT/OT, no SLP             -Continue CIR 2.  Antithrombotics: -DVT/anticoagulation:  Mechanical: Sequential compression devices, below knee Bilateral lower extremities             -antiplatelet therapy: ASA 81mg  QD 3. Pain Management: Tylenol 500 mg/hs + PRN. Norco PRN.  --continue Flexeril and Hydrocodone prn.  --resume Voltaren gel to bilateral knees and  left shoulder for chronic pain.  --LEFT knee injected a month ago at Dr. Nilsa Nutting office.--observe for gait/wb tolerance with staff/therapy  4. Mood/Behavior/Sleep:  LCSW to follow for evaluation and support.              -antipsychotic agents: N/A --continue Ambien prn for insomnia.--> 10/31/22 increased to 10mg  PRN since that's his home dose--improved! 5. Neuropsych/cognition: This patient is capable of making decisions on his own behalf. 6. Skin/Wound Care: Monitor incision for healing.  7. Fluids/Electrolytes/Nutrition: encourage po, monitor routine labs.  8. Duodenitis/Evidence of PUD: Will increase protonix 40mg  to BID for now. .  9. HTN:  Monitor BP TID--continue atenolol 50mg  daily and amlodipine 10mg  daily. -6/14-16/24 bp controlled  Vitals:   10/29/22 1536 10/29/22 2021 10/30/22 0508 10/30/22 1251  BP: 123/69 124/72 118/68 107/71   10/30/22 2002 10/31/22 0532 10/31/22 1447 10/31/22 1938  BP: 114/70 110/69 109/74 113/65   11/01/22 0536  BP: 114/68    10. Suboptimal B 12 level @ 195: Continue supplement.  11. Neurogenic bowel/constipation:  Continue Miralax QD and Senna S 2 tabs BID-->am/noon             -- KUB with moderate stool burden  -Pt is fine with a daily suppository after supper. Can continue until he's has more consistent BM's. He had results last night and had another BM on his own this am. -11/01/22 LBM last night, cont regimen 12. Hyponatremia: Na has been ranging from 128 to 133 range since 05/31.              --changed diet to regular. (No hx of CAD documented)  -6/14 sodium 133--continue current plan 13. Leukocytosis: Has been on prednisone/steroids for 4-5 weeks followed by decadron with last admission.               --no fevers, WBC now normal.  14. Abnormal LFTs: LFT's now WNL  15. Pre diabetes w/stress induced/steroid induced hyperglycemia: Hgb A1C 5.9. Monitor, no need for insulin or CBG checks for now.  16. Neurogenic bladder/BPH: Has been toileting himself but  now with frequency/voiding small amounts.   -pt reports improved sense of bladder filling pressure  -continue timed toileting --Continue flomax 0.4mg  daily    LOS: 3 days A FACE TO FACE EVALUATION WAS PERFORMED  [address] 11/01/2022, 10:59 AM

## 2022-11-02 DIAGNOSIS — M4714 Other spondylosis with myelopathy, thoracic region: Secondary | ICD-10-CM | POA: Diagnosis not present

## 2022-11-02 LAB — CBC
HCT: 32 % — ABNORMAL LOW (ref 39.0–52.0)
Hemoglobin: 10.6 g/dL — ABNORMAL LOW (ref 13.0–17.0)
MCH: 31 pg (ref 26.0–34.0)
MCHC: 33.1 g/dL (ref 30.0–36.0)
MCV: 93.6 fL (ref 80.0–100.0)
Platelets: 153 10*3/uL (ref 150–400)
RBC: 3.42 MIL/uL — ABNORMAL LOW (ref 4.22–5.81)
RDW: 12.5 % (ref 11.5–15.5)
WBC: 6.4 10*3/uL (ref 4.0–10.5)
nRBC: 0 % (ref 0.0–0.2)

## 2022-11-02 LAB — BASIC METABOLIC PANEL
Anion gap: 9 (ref 5–15)
BUN: 10 mg/dL (ref 8–23)
CO2: 25 mmol/L (ref 22–32)
Calcium: 7.9 mg/dL — ABNORMAL LOW (ref 8.9–10.3)
Chloride: 103 mmol/L (ref 98–111)
Creatinine, Ser: 0.84 mg/dL (ref 0.61–1.24)
GFR, Estimated: >60 mL/min (ref >60)
Glucose, Bld: 90 mg/dL (ref 70–99)
Potassium: 3.9 mmol/L (ref 3.5–5.1)
Sodium: 137 mmol/L (ref 135–145)

## 2022-11-02 MED ORDER — ACETAMINOPHEN 325 MG PO TABS: 325.0000 mg | ORAL_TABLET | ORAL | Status: DC | PRN

## 2022-11-02 MED ORDER — BACLOFEN 10 MG PO TABS
10.0000 mg | ORAL_TABLET | Freq: Every day | ORAL | Status: DC
  Administered 2022-11-02: 10 mg via ORAL
  Filled 2022-11-02: qty 1

## 2022-11-02 NOTE — Progress Notes (Signed)
Physical Therapy Session Note  Patient Details  Name: Mitchell Morgan MRN: [id number] Date of Birth: [birth year]  Today's Date: 11/02/2022 PT Individual Time: 0900-1000, 1300-1415 PT Individual Time Calculation (min): 60 min, 75 min   Short Term Goals: Week 1:  PT Short Term Goal 1 (Week 1): =LTGs d/t ELOS  Skilled Therapeutic Interventions/Progress Updates:    Session 1: Pt received in recliner and agreeable to therapy.  Pt reports 5/10 pain during session, stable with mobility, premedicated. Rest and positioning provided as needed.   Pt ambulated with RW and CGA >120 ft during session. Cues for upright posture and higher gaze. Note genu recurvatum at times, especially with fatigue, and mild ataxic gait. Pt navigated 6" steps with BHR and min A for knee block, step to pattern with RLE leading. 2 bouts x 15 ft of gait with no AD, mod a fading to min A with improvements in gait mechanics and balance with repetition.   Pt then performed coordination activity with step up and step tap on colored dots. Cues and facilitation for knee stability and eccentric control.  Transitioned to Sit to stand with no UE support. Cues for anterior weight shift to prevent posterior lean and improve ankle strategies, with good improvement, fading to supervision.   Pt requested to use bathroom on return to room, CGA for 3/3 toileting tasks. Pt returned to recliner and was left with all needs in reach and alarm active.   Session 2: Pt seated EOB on arrival, reports 2.5/10 pain, premedicated. Rest and positioning provided as needed.   Pt ambulated with RW and CGA throughout session, note mild ataxic gait pattern, but improving stride length and roll through. Performed side stepping and reverse walking with CGA for improved functional mobility in tight spaces. Pt then performed several bouts of 150-200 ft with 4lb ankle weight for increased proprioception. Ambulated x 10 ft with no AD, but pt reports too fatigued to  repeat. Demoed improvements in ataxia even after weight removal. Pt navigated obstacle course including ramp and stepping over cone, and weaving through cones for turning practice.   Pt returned to room and requested to use bathroom, supervision for 3/3 toileting tasks. Pt returned to bed and was left with all needs in reach and alarm active.   Therapy Documentation Precautions:  Precautions Precautions: Fall, Back Precaution Comments: pt able to state 3/3; required no cues to adhere, Back precautions posted above bed Restrictions Weight Bearing Restrictions: No General:       Therapy/Group: Individual Therapy  Juluis Rainier 11/02/2022, 12:49 PM

## 2022-11-02 NOTE — Progress Notes (Signed)
Attempted to meet patient but was not in room. Will try again later.

## 2022-11-02 NOTE — Discharge Instructions (Signed)
Inpatient Rehab Discharge Instructions  Mitchell Morgan Discharge date and time:  11/06/22  Activities/Precautions/ Functional Status: Activity: no lifting, driving, or strenuous exercise till cleared by MD Diet: diabetic diet Wound Care: keep wound clean and dry   Functional status:  ___ No restrictions     ___ Walk up steps independently ___ 24/7 supervision/assistance   ___ Walk up steps with assistance _X__ Intermittent supervision/assistance  ___ Bathe/dress independently ___ Walk with walker     ___ Bathe/dress with assistance ___ Walk Independently    ___ Shower independently ___ Walk with assistance    _X__ Shower with assistance _X__ No alcohol     ___ Return to work/school ________  Special Instructions:    COMMUNITY REFERRALS UPON DISCHARGE:    Home Health:   PT      OT       SNA                  Agency:[facility]  Phone:[phone] *Please expect follow-up within 2-3 days to schedule your home visit. If you have not received follow-up, be sure to contact the branch directly.*   Medical Equipment/Items Ordered: 3in1 bedside commode and shower seat with back and armrests                                                  [facility]Supplier[facility]  GENERAL COMMUNITY RESOURCES FOR PATIENT/FAMILY: If you have questions about the status of your DME, please contact [facility] [phone].  My questions have been answered and I understand these instructions. I will adhere to these goals and the provided educational materials after my discharge from the hospital.  Patient/Caregiver Signature _______________________________ Date __________  Clinician Signature _______________________________________ Date __________  Please bring this form and your medication list with you to all your follow-up doctor's appointments.

## 2022-11-02 NOTE — Progress Notes (Signed)
Occupational Therapy Session Note  Patient Details  Name: Mitchell Morgan MRN: [id number] Date of Birth: 22-Aug-1945  Today's Date: 11/02/2022 OT Individual Time: 4098-1191 OT Individual Time Calculation (min): 28 min    Short Term Goals: Week 1:  OT Short Term Goal 1 (Week 1): STG = LTGS (d/t ELOS)  Skilled Therapeutic Interventions/Progress Updates:  Skilled OT intervention completed with focus on ambulatory and cardiovascular endurance. Pt received seated in recliner, agreeable to session. No pain reported.  Pt declined self-care needs as he showered during early AM session. OT suggested application of tennis shoes for spinal support during mobility. Able to doff grip socks, donn new socks and tennis shoes all with supervision using figure 4 position.   Sit > stands and ambulation using RW with CGA/supervision. Ambulated to day room > 150 ft with no LOB and good safety awareness.  Completed 6 mins on nustep with BLE only for cardiovascular/general endurance on level 4. Mild fatigue expressed. Ambulated back to room; a neighboring pt recognized pt and pt stopped outside of room to reunite and able to maintain stance for > 5 mins but did need cues for readiness to return to room due to noted generalized shaking from full body fatigue.   Back in room, pt completed supervision bed mobility to supine with bed features, however cues needed for log roll as pt with twist method to get into bed. Doffed shoes total A for time. Discussed DME needs including full hip kit for AE for LB dressing due to [employer] and shower chair with back/arm rests [place]; CSW notified.  Pt remained semi upright in bed, with bed alarm on/activated, and with all needs in reach at end of session.   Therapy Documentation Precautions:  Precautions Precautions: Fall, Back Precaution Comments: pt able to state 3/3; required no cues to adhere, Back precautions posted above  bed Restrictions Weight Bearing Restrictions: No    Therapy/Group: Individual Therapy  Melvyn Novas, MS, OTR/L  11/02/2022, 11:54 AM

## 2022-11-02 NOTE — Progress Notes (Signed)
PROGRESS NOTE   Subjective/Complaints:  Pt reports slept OK, however legs jerking continuously overnight- very bothersome- Flexeril not working.  Had 3 formed BM's yesterday- 2pm, 7:50 pm after bowel program and 11pm- all on toilet-    ROS:   Pt denies SOB, abd pain, CP, N/V/C/D, and vision changes  Except for HPI Objective:   No results found. Recent Labs    11/02/22 0537  WBC 6.4  HGB 10.6*  HCT 32.0*  PLT 153   Recent Labs    11/02/22 0537  NA 137  K 3.9  CL 103  CO2 25  GLUCOSE 90  BUN 10  CREATININE 0.84  CALCIUM 7.9*    Intake/Output Summary (Last 24 hours) at 11/02/2022 0905 Last data filed at 11/02/2022 0736 Gross per 24 hour  Intake 1193 ml  Output 1175 ml  Net 18 ml     Pressure Injury 10/29/22 Coccyx Mid Stage 2 -  Partial thickness loss of dermis presenting as a shallow open injury with a red, pink wound bed without slough. small area over coccyx (Active)  10/29/22 1524  Location: Coccyx  Location Orientation: Mid  Staging: Stage 2 -  Partial thickness loss of dermis presenting as a shallow open injury with a red, pink wound bed without slough.  Wound Description (Comments): small area over coccyx  Present on Admission: Yes    Physical Exam: Vital Signs Blood pressure 122/71, pulse 76, temperature 97.7 F (36.5 C), resp. rate 18, height 5\' 9"  (1.753 m), weight 89.4 kg, SpO2 94 %.   General: awake, alert, appropriate, sitting EOB after walking there with OT after shower; OTA in room; NAD HENT: conjugate gaze; oropharynx moist CV: regular  Rhythm, tachycardic (110s) rate; no JVD Pulmonary: CTA B/L; no W/R/R- good air movement GI: soft, NT, ND, (+)BS- normoactive Psychiatric: appropriate- pleasant, interactive Neurological: Ox3 Maybe MAS of 1, but no clonus- or spasms with ROM Extremities: No clubbing, cyanosis, or edema. Pulses are 2+ Psych: Pt's affect is appropriate. Pt is  cooperative   PRIOR EXAMS: Skin: Thoracic incision CDI, foam dressing on sacrum Neuro:  Alert and oriented x 3. Normal insight and awareness. Intact Memory. Normal language and speech. Cranial nerve exam unremarkable. MMT: BUE 5/5 except for left shoulder which is 4/5. BLE: HF 4-, KE 4, ADF/APF 4+ to 5/5. Decreased sense of LT and pain below the T12 level. DTR's 1+ in LE's. No abnl resting tone.   Musculoskeletal: Left shoulder limited with ABD/IR due to pain/RTC. Tenderness along thoracic spine/surgical site. Otherwise WNL    Assessment/Plan: 1. Functional deficits which require 3+ hours per day of interdisciplinary therapy in a comprehensive inpatient rehab setting. Physiatrist is providing close team supervision and 24 hour management of active medical problems listed below. Physiatrist and rehab team continue to assess barriers to discharge/monitor patient progress toward functional and medical goals  Care Tool:  Bathing    Body parts bathed by patient: Right arm, Left arm, Abdomen, Chest, Front perineal area, Buttocks, Right upper leg, Left upper leg, Right lower leg, Left lower leg, Face         Bathing assist Assist Level: Set up assist     Upper  Body Dressing/Undressing Upper body dressing   What is the patient wearing?: Pull over shirt    Upper body assist Assist Level: Set up assist    Lower Body Dressing/Undressing Lower body dressing      What is the patient wearing?: Pants, Underwear/pull up     Lower body assist Assist for lower body dressing: Set up assist     Toileting Toileting    Toileting assist Assist for toileting: Contact Guard/Touching assist     Transfers Chair/bed transfer  Transfers assist     Chair/bed transfer assist level: Contact Guard/Touching assist     Locomotion Ambulation   Ambulation assist      Assist level: Minimal Assistance - Patient > 75% Assistive device: Walker-rolling Max distance: 180   Walk 10 feet  activity   Assist  Walk 10 feet activity did not occur: Safety/medical concerns (endurance after OT eval)  Assist level: Contact Guard/Touching assist Assistive device: Walker-rolling   Walk 50 feet activity   Assist Walk 50 feet with 2 turns activity did not occur: Safety/medical concerns  Assist level: Contact Guard/Touching assist Assistive device: Walker-rolling    Walk 150 feet activity   Assist Walk 150 feet activity did not occur: Safety/medical concerns  Assist level: Minimal Assistance - Patient > 75% Assistive device: Walker-rolling    Walk 10 feet on uneven surface  activity   Assist Walk 10 feet on uneven surfaces activity did not occur: Safety/medical concerns         Wheelchair     Assist Is the patient using a wheelchair?: Yes Type of Wheelchair: Manual    Wheelchair assist level: Supervision/Verbal cueing Max wheelchair distance: 150    Wheelchair 50 feet with 2 turns activity    Assist        Assist Level: Supervision/Verbal cueing   Wheelchair 150 feet activity     Assist      Assist Level: Supervision/Verbal cueing   Blood pressure 122/71, pulse 76, temperature 97.7 F (36.5 C), resp. rate 18, height 5\' 9"  (1.753 m), weight 89.4 kg, SpO2 94 %.  Medical Problem List and Plan: 1. 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 Dutch Quint on 10/15/2022.  Patient with continued lower extremity numbness and weakness and decreased control bowel bladder.             -patient may shower             -ELOS/Goals:  7-10 days, Mod I PT/OT, no SLP             con't CIR PT and OT 2.  Antithrombotics: -DVT/anticoagulation:  Mechanical: Sequential compression devices, below knee Bilateral lower extremities             -antiplatelet therapy: ASA 81mg  QD 3. Pain Management: Tylenol 500 mg/hs + PRN. Norco PRN.  --continue Flexeril and Hydrocodone prn.  --resume  Voltaren gel to bilateral knees and left shoulder for chronic pain.  --LEFT knee injected a month ago at Dr. Nilsa Nutting office.--observe for gait/wb tolerance with staff/therapy  6/17- pain controlled, but jerking/spasticity is not 4. Mood/Behavior/Sleep:  LCSW to follow for evaluation and support.              -antipsychotic agents: N/A --continue Ambien prn for insomnia.--> 10/31/22 increased to 10mg  PRN since that's his home dose--improved! 6/17- sleeping much better- con't regimen 5. Neuropsych/cognition: This patient is capable of making decisions on his own behalf. 6. Skin/Wound Care: Monitor incision  for healing.  7. Fluids/Electrolytes/Nutrition: encourage po, monitor routine labs.  8. Duodenitis/Evidence of PUD: Will increase protonix 40mg  to BID for now. .  9. HTN: Monitor BP TID--continue atenolol 50mg  daily and amlodipine 10mg  daily. -6/17- BP controlled- con't regimen Vitals:   10/29/22 1536 10/29/22 2021 10/30/22 0508 10/30/22 1251  BP: 123/69 124/72 118/68 107/71   10/30/22 2002 10/31/22 0532 10/31/22 1447 10/31/22 1938  BP: 114/70 110/69 109/74 113/65   11/01/22 0536 11/01/22 1333 11/01/22 1938 11/02/22 0521  BP: 114/68 113/68 116/76 122/71    10. Suboptimal B 12 level @ 195: Continue supplement.  11. Neurogenic bowel/constipation:  Continue Miralax QD and Senna S 2 tabs BID-->am/noon             -- KUB with moderate stool burden  -Pt is fine with a daily suppository after supper. Can continue until he's has more consistent BM's. He had results last night and had another BM on his own this am. -11/01/22 LBM last night, cont regimen 6/17- 3 Bms yesterday- 1 at 2pm, and 2 with bowel program/after- will con't bowel program for now- likely will be able to stop soon.  12. Hyponatremia: Na has been ranging from 128 to 133 range since 05/31.              --changed diet to regular. (No hx of CAD documented)  -6/14 sodium 133--continue current plan  6/17- Na 137 this AM 13.  Leukocytosis: Has been on prednisone/steroids for 4-5 weeks followed by decadron with last admission.               --no fevers, WBC now normal.   6/17- WBC 6.4 14. Abnormal LFTs: LFT's now WNL  15. Pre diabetes w/stress induced/steroid induced hyperglycemia: Hgb A1C 5.9. Monitor, no need for insulin or CBG checks for now.  16. Neurogenic bladder/BPH: Has been toileting himself but now with frequency/voiding small amounts.   -pt reports improved sense of bladder filling pressure  -continue timed toileting --Continue flomax 0.4mg  daily  17. Spasticity  6/17- didn't get increased tone/clonus on exam, however spasticity has circadian rhythm to it and usually worse at night, when pt having issues- will start Baclofen 10 mg at bedtime for now and monitor Sx's.   I spent a total of  52   minutes on total care today- >50% coordination of care- due to review of chart since been gone- pt new; d/w OTA; and revioew of spasticity /education.    LOS: 4 days A FACE TO FACE EVALUATION WAS PERFORMED  Matha Masse 11/02/2022, 9:05 AM

## 2022-11-02 NOTE — Progress Notes (Signed)
Inpatient Rehabilitation Care Coordinator Assessment and Plan Patient Details  Name: Mitchell Morgan MRN: [id number] Date of Birth: 14-Mar-1946  Today's Date: 11/02/2022  Hospital Problems: Principal Problem:   Thoracic myelopathy  Past Medical History:  Past Medical History:  Diagnosis Date   Basal cell carcinoma    left cheek   BPH (benign prostatic hyperplasia)    Diverticulosis    GERD (gastroesophageal reflux disease)    High cholesterol    Hip pain, bilateral    left > right   Hypertension    Insomnia    Knee pain, bilateral    Prediabetes    Rotator cuff disorder, left    Past Surgical History:  Past Surgical History:  Procedure Laterality Date   DECOMPRESSIVE LUMBAR LAMINECTOMY LEVEL 1 N/A 10/15/2022   Procedure: DECOMPRESSIVE thoracic laminectomy THORACIC TEN -ELEVEN;  Surgeon: Mitchell Sicks, MD;  Location: [facility] OR;  Service: Neurosurgery;  Laterality: N/A;   Social History:  reports that he has never smoked. He has never used smokeless tobacco. He reports that he does not drink alcohol and does not use drugs.  Family / Support Systems Marital Status: Married How Long?: 58 years Patient Roles: Spouse, Parent Spouse/Significant Other: Mitchell Morgan (wife) Children: 2 children. I living son- Mitchell Morgan Other Supports: none reported Anticipated Caregiver: wife Mitchell Morgan and PRN support from son Ability/Limitations of Caregiver: Pt will have support from primarily wife, and PRN support from son. Caregiver Availability: 24/7 Family Dynamics: Pt lives with his wife in [place]. Pt and wife  were visiting son here in [place].  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 Education: 9th grade Health Literacy - How often do you need to have someone help you when you read instructions, pamphlets, or other written material from your doctor or pharmacy?: Never Writes: Yes Employment Status: Retired Date  Retired/Disabled/Unemployed: 2012 Age Retired: 14 Marine scientist Issues: Denies Guardian/Conservator: N/A. No HCPOA in place. SW gave him an advanced care directive packet. RN to place consult with chaplain to discuss as well.   Abuse/Neglect Abuse/Neglect Assessment Can Be Completed: Yes Physical Abuse: Denies Verbal Abuse: Denies Sexual Abuse: Denies Exploitation of patient/patient's resources: Denies Self-Neglect: Denies  Patient response to: Social Isolation - How often do you feel lonely or isolated from those around you?: Never  Emotional Status Pt's affect, behavior and adjustment status: Pt in good spirits at time of visit. Recent Psychosocial Issues: Denies Psychiatric History: Denies Substance Abuse History: Denies  Patient / Family Perceptions, Expectations & Goals Pt/Family understanding of illness & functional limitations: Pt and wife have general understanding of pt care needs Premorbid pt/family roles/activities: Independent Anticipated changes in roles/activities/participation: Assistance with ADLs/IADLs Pt/family expectations/goals: Pt goal is to be able to walk unassisted  [employer]: None Premorbid Home Care/DME Agencies: None Transportation available at discharge: TBD Is the patient able to respond to transportation needs?: Yes In the past 12 months, has lack of transportation kept you from medical appointments or from getting medications?: No In the past 12 months, has lack of transportation kept you from meetings, work, or from getting things needed for daily living?: No Resource referrals recommended: Neuropsychology  Discharge Planning Living Arrangements: Spouse/significant other, Children Support Systems: Spouse/significant other, Children Type of Residence: Private residence Insurance Resources: Media planner (specify) ([facility] (primary) and Paramedic (secondary)) Financial Resources: Patent examiner Screen Referred: No Living Expenses: Own Money Management: Patient Does the patient have any problems  obtaining your medications?: No Home Management: Pt reports he and his wife shared home care duties, and wife prepared all meals. Patient/Family Preliminary Plans: Wife to assume all homecare roles Care Coordinator Barriers to Discharge: Decreased caregiver support, Lack of/limited family support Care Coordinator Anticipated Follow Up Needs: HH/OP Expected length of stay: 7-10 days  Clinical Impression SW met with pt in room at bedside. No HCPOA. [employer] veteran [phone]. Reports that he was visiting his son when this happened. His plan is to remain here in [place] until he is able to return to [facility]. DME: RW. He is aware all DME and outpatient therapies will be ordered/arranged through [place].  [name] A Carzell Saldivar 11/02/2022, 1:28 PM

## 2022-11-02 NOTE — Progress Notes (Signed)
Occupational Therapy Session Note  Patient Details  Name: Mitchell Morgan MRN: [id number] Date of Birth: 10-16-1945  Today's Date: 11/02/2022 OT Individual Time: 4098-1191 OT Individual Time Calculation (min): 71 min    Short Term Goals: Week 1:  OT Short Term Goal 1 (Week 1): STG = LTGS (d/t ELOS)  Skilled Therapeutic Interventions/Progress Updates:    Initial OT intervention with focus on amb with room with RW to access bathroom. Bathing at shower level. Dressing with sit<>stand from seat. Min verbal cues for back precautions. Pt able to employ figure 4 to doff/don socks. Standing at sink to brush teeth. Pt amb with RW to day room. Standing task for corn hole. CGA for tossing and retrieving with reacher. CGA for standing at table to clean bean bags with BUE. Pt with slight posterior bias when standing without UE support. Rest breaks x1 when cleaning bean bags. Pt amb with RW back to room to sit in recliner. Belt alarm activated. All needs within reach.  Therapy Documentation Precautions:  Precautions Precautions: Fall, Back Precaution Comments: pt able to state 3/3; required no cues to adhere, Back precautions posted above bed Restrictions Weight Bearing Restrictions: No Pain:  Pt reports 2/10 back pain; warm shower and rest   Therapy/Group: Individual Therapy  Rich Brave 11/02/2022, 8:16 AM

## 2022-11-02 NOTE — Progress Notes (Signed)
IP Rehab Bowel Program Documentation   Bowel Program Start time [phone]   Dig Stim Indicated? Yes  Dig Stim Prior to Suppository or mini Enema X 2   Output from dig stim: Minimal  Ordered intervention: Suppository Yes , mini enema No ,   Repeat dig stim after Suppository or Mini enema  X 1,  Output? Large   Bowel Program Complete? Yes , handoff given Eduard Clos, LPN   Patient Tolerated? Yes

## 2022-11-02 NOTE — Progress Notes (Addendum)
Patient ID: Mitchell Morgan, male   DOB: 02-21-1946, 77 y.o.   MRN: [id number]  SW sent another email to [facility] CM- Marylene Land to discuss coordination of care services and waiting on follow-up.   SW left message for [name] informing on ELOS and discussing family edu. SW shared challenges with making contact with [facility], and will discuss with pt as well preference on if they would like to wait until contact is made with [facility] for DME needs, or use secondary insurance. SW waiting on follow-up.  SW left message for Fabio Asa SW with [place] [phone] ext [phone]) to follow-up about how services are coordinating with regard to ordering DME and outpatient/HH services. SW waiting on follow-up.  *SW spoke with [facility] SW [phone]) to discuss DME and Outpatient therapies. Reports that DME/outpatient has to be ordered through primary care doctor. SW informed pt intent to stay here in [place] until able to return. Current address on facesheet. Orders and clinicals emailed to [place]. SW will follow-up with d/c date after team conference.   [facility] PCP/Clinic- Dr. Marylee Floras at [facility] [phone]  Cecile Sheerer, MSW, LCSWA Office: [phone] Cell: [phone] Fax: [phone]

## 2022-11-02 NOTE — Progress Notes (Signed)
   11/02/22 1400  Spiritual Encounters  Type of Visit Initial  Care provided to: Patient  Referral source Patient request  Reason for visit Advance directives  OnCall Visit No   Ch responded to request for AD. There was no family at bedside. Ch assisted pt with AD education. Pt will page Ch when ready. No follow-up needed at this time.

## 2022-11-02 NOTE — Plan of Care (Signed)
  Problem: SCI BOWEL ELIMINATION Goal: RH STG MANAGE BOWEL WITH ASSISTANCE Description: STG Manage Bowel with min Assistance. Outcome: Not Progressing; bowel program   Problem: SCI BLADDER ELIMINATION Goal: RH STG MANAGE BLADDER WITH ASSISTANCE Description: STG Manage Bladder With cueing Assistance Outcome: Not Progressing; neurogenic bladder

## 2022-11-02 NOTE — Care Management (Signed)
Inpatient Rehabilitation Center Individual Statement of Services  Patient Name:  UNKNOWN MANGANELLI  Date:  11/02/2022  Welcome to the Inpatient Rehabilitation Center.  Our goal is to provide you with an individualized program based on your diagnosis and situation, designed to meet your specific needs.  With this comprehensive rehabilitation program, you will be expected to participate in at least 3 hours of rehabilitation therapies Monday-Friday, with modified therapy programming on the weekends.  Your rehabilitation program will include the following services:  Physical Therapy (PT), Occupational Therapy (OT), 24 hour per day rehabilitation nursing, Therapeutic Recreaction (TR), Psychology, Neuropsychology, Care Coordinator, Rehabilitation Medicine, Nutrition Services, Pharmacy Services, and Other  Weekly team conferences will be held on Tuesdays to discuss your progress.  Your Inpatient Rehabilitation Care Coordinator will talk with you frequently to get your input and to update you on team discussions.  Team conferences with you and your family in attendance may also be held.  Expected length of stay: 7-10 days  Overall anticipated outcome: Independent with Assistive Device  Depending on your progress and recovery, your program may change. Your Inpatient Rehabilitation Care Coordinator will coordinate services and will keep you informed of any changes. Your Inpatient Rehabilitation Care Coordinator's name and contact numbers are listed  below.  The following services may also be recommended but are not provided by the Inpatient Rehabilitation Center:  Driving Evaluations Home Health Rehabiltiation Services Outpatient Rehabilitation Services Vocational Rehabilitation   Arrangements will be made to provide these services after discharge if needed.  Arrangements include referral to agencies that provide these services.  Your insurance has been verified to be:  [employer] primary  doctor is:  Dennis Bast  Pertinent information will be shared with your doctor and your insurance company.  Inpatient Rehabilitation Care Coordinator:  Susie Cassette [phone] or [phone]  Information discussed with and copy given to patient by: Gretchen Short, 11/02/2022, 9:28 AM

## 2022-11-03 DIAGNOSIS — M4714 Other spondylosis with myelopathy, thoracic region: Secondary | ICD-10-CM | POA: Diagnosis not present

## 2022-11-03 MED ORDER — BACLOFEN 10 MG PO TABS
10.0000 mg | ORAL_TABLET | Freq: Every day | ORAL | Status: DC
  Administered 2022-11-03 – 2022-11-05 (×3): 10 mg via ORAL
  Filled 2022-11-03 (×3): qty 1

## 2022-11-03 NOTE — Progress Notes (Signed)
Physical Therapy Session Note  Patient Details  Name: Mitchell Morgan MRN: [id number] Date of Birth: 1945-10-12  Today's Date: 11/03/2022 PT Individual Time: 1000-1100, 1300-1330 PT Individual Time Calculation (min): 60 min, 30 min   Short Term Goals: Week 1:  PT Short Term Goal 1 (Week 1): =LTGs d/t ELOS  Skilled Therapeutic Interventions/Progress Updates:    Session 1: Pt seated in w/c on arrival and agreeable to therapy. No complaint of pain. ambulatory transfer to bathroom for continent bladder void. Supervision for 3/3 toileting tasks.   Gait: Pt ambulated throughout session with RW. Cues for upright posture and RW proximity. Still note BLE ataxia but no true LOB. During session, pt ambulated with light support on hall rail 2 x 30 ft, 1 x 60 ft, and 1 x 55 ft. Pt ambulated with min a, with scissoring gait, L>R which he was able to moderately improve with cueing. Min a for balance. On last bout, knee buckling x2 with max A to recover before discontinuing trial.  NMR: Pt performed cone taps with RW and CGA with minimal difficulty. Progressed to cone taps with walking stick for increased challenge, requiring min- mod A and increased rest breaks. Pt also performed obstacle course including weaving through cones and stepping over obstacles. Discussed safe use of RW for threshold management.   Pt returned to room and bed, was left with all needs in reach and alarm active.   Session 2: Pt seated in w/c on arrival and agreeable to therapy. No complaint of pain. ambulatory transfer to bathroom with CGA, supervision for 3/3 toileting tasks.   Session focused on community integration and gait training. Pt ambulated with RW and CGA to [facility] entrance. Cueing for safety on elevators and in community environment. Pt ambulated over uneven brick, steeply sloped sidewalk, thresholds, etc with no more than CGA and verbal cueing for safety. Pt demoes good safety awareness when faced with unfamiliar  obstacles.   During seated rest break, discussed safety at home, including energy conservation, plan for walking program at home, etc. Pt returned to w/c and was handed off to CSW at end of session.   Therapy Documentation Precautions:  Precautions Precautions: Fall, Back Precaution Comments: pt able to state 3/3; required no cues to adhere, Back precautions posted above bed Restrictions Weight Bearing Restrictions: No General:       Therapy/Group: Individual Therapy  Juluis Rainier 11/03/2022, 12:48 PM

## 2022-11-03 NOTE — Progress Notes (Signed)
Patient ID: Mitchell Morgan, male   DOB: 03/26/1946, 77 y.o.   MRN: [id number]  SW spoke with [facility] SW to discuss orders needed, and DME needed for pt.  [facility] able  to accept referral for [facility]: [facility] therapies whether [facility] or [employer] [facility]. SW will confirm.   Later informed pt is not service connected and will need to use [facility] for [facility] therapies instead. Will confirm when DME will be ready. SW informed pt states his son can pick up DME from [place] office.   SW met with pt in room to provide updates from team conference, and d/c date 6/18, and updates with regard to  coordinating care with DME with [facility], and [facility] remains in place for services.   1518-SW spoke with Danise Edge SW informing SW that orders were cancelled and had to be resubmitted by their department. All DME orders and initial outpatient orders are all being reviewed. Will follow-up on Thursday as office is closed tomorrow due to holiday. SW updated [facility] on above and will confirm.  32- SW spoke with pt wife Darel Hong to discuss above. She is aware we will confirm updates on Thursday. Fam edu scheduled for Thursday (6/20) 1pm-4pm.  Cecile Sheerer, MSW, LCSWA Office: [phone] Cell: [phone] Fax: [phone]

## 2022-11-03 NOTE — Patient Care Conference (Signed)
Inpatient RehabilitationTeam Conference and Plan of Care Update Date: 11/03/2022   Time: 11:22 AM   Patient Name: Mitchell Morgan      Medical Record Number: [id number]  Date of Birth: Feb 17, 1946 Sex: Male         Room/Bed: 4W02C/4W02C-01 Payor Info: Payor: [employer] / Plan: [facility] / Product Type: *No Product type* /    Admit Date/Time:  10/29/2022  3:15 PM  Primary Diagnosis:  Thoracic myelopathy  Hospital Problems: Principal Problem:   Thoracic myelopathy    Expected Discharge Date: Expected Discharge Date: 11/06/22  Team Members Present: Physician leading conference: Dr. Genice Rouge Social Worker Present: Cecile Sheerer, LCSWA Nurse Present: Vedia Pereyra, RN PT Present: Bernie Covey, PT OT Present: Valetta Fuller, OT PPS Coordinator present : Fae Pippin, SLP     Current Status/Progress Goal Weekly Team Focus  Bowel/Bladder   Continent of b/b. Bowel program daily.   Remain continent, continue bowel program   Assist with toileting as needed    Swallow/Nutrition/ Hydration               ADL's   supervision/CGA for transfers; supervision for bathing at shower level; dressing with sit<>stand-CGA/supervision   mod I overall   standing balance, activity tolerance, safety awareness, family education Barriers:endurance and standing balance    Mobility   CGA gait and transfers, limited by mild ataxia and knee instability   mod I transfers, supervision gait  knee instability, balance    Communication                Safety/Cognition/ Behavioral Observations               Pain   Pain 6/10 in lower back and BLE. PRN norco available and being used   Pain <3/10   Assess qshift and prn    Skin   Surgical incision CDI, remainder of skin intact   Maintain skin integrity  Assess Qshift and prn      Discharge Planning:  Pt to return to his son's home here in [place]. Pt wife primary support. PRN support from son. All DME  and outpatient therapies to be arranged by [facility]. DME- 3in1 BSC and TTB with armrests and back; Outpatient- PT/OT. Orders and clinicals faxed. SW will confirm if any barriers to discharge.   Team Discussion: Thoracic myelopathy. Baclofen added for adjustments. Holding bowel program x 2 days. If unable to have BM will place back on bowel program just prior to discharge. Incision to back is intact. Stage II to coccyx with unattached edges, no drainage. Treating with foam dressing.  Patient on target to meet rehab goals: yes, meeting goals for discharge 06/21/024  *See Care Plan and progress notes for long and short-term goals.   Revisions to Treatment Plan:  Medications adjustments.  Will restart bowel program Thursday 06/20 if no BM.  Teaching Needs: Medications, safety, self care, skin care, gait/transfer training, etc.   Current Barriers to Discharge: Decreased caregiver support, Neurogenic bowel and bladder, and Wound care  Possible Resolutions to Barriers: Family education, [facility] providing DME and outpatient therapy.      Medical Summary Current Status: increased spasticity- esp at night; timed toileting- conitnent- taking pain meds q6 hours- has stage II mid sacrum-  foam dressing;  Barriers to Discharge: Complicated Wound;Medical stability;Self-care education;Spasticity;Weight bearing restrictions;Neurogenic Bowel & Bladder;Uncontrolled Pain  Barriers to Discharge Comments: pain 6/10- will need H/H when leaves- and then transfer back to his home- Barriers- Neurogenic bowel-  spasticity- LE weakness- pain control Possible Resolutions to [employer] Focus: added Baclofen at 4pm and 11pm for spasticity- hold bowel program for 2 days and then see if he needs it- d/c 6/21- will teach bowel program before d/c.   Continued Need for Acute Rehabilitation Level of Care: The patient requires daily medical management by a physician with specialized training in physical medicine and rehabilitation for  the following reasons: Direction of a multidisciplinary physical rehabilitation program to maximize functional independence : Yes Medical management of patient stability for increased activity during participation in an intensive rehabilitation regime.: Yes Analysis of laboratory values and/or radiology reports with any subsequent need for medication adjustment and/or medical intervention. : Yes   I attest that I was present, lead the team conference, and concur with the assessment and plan of the team.   Jearld Adjutant 11/03/2022, 3:06 PM

## 2022-11-03 NOTE — Progress Notes (Signed)
Occupational Therapy Session Note  Patient Details  Name: Mitchell Morgan MRN: [id number] Date of Birth: 1945-10-29  Today's Date: 11/03/2022 OT Individual Time: 7829-5621 OT Individual Time Calculation (min): 40 min   Today's Date: 11/03/2022 OT Individual Time: 1335-1410 OT Individual Time Calculation (min): 35 min  and Today's Date: 11/03/2022 OT Missed Time: 10 Minutes Missed Time Reason: Other (comment) (Delay in service.)  Short Term Goals: Week 1:  OT Short Term Goal 1 (Week 1): STG = LTGS (d/t ELOS)  Skilled Therapeutic Interventions/Progress Updates:   Session 1: Pt received resting in bed for skilled OT session with focus on BADL participation, functional mobility, and BLE coordination. Pt agreeable to interventions, demonstrating overall pleasant mood. Pt reported 3/10 back and leg pain, medications administered during session. OT offering intermediate rest breaks and positioning suggestions throughout session to address pain/fatigue and maximize participation/safety in session.   Pt comes to EOB with supervision. Seated EOB, pt dons tennis shoes with item-retrieval, using figure-4 technique. Pt ambulates into/out of bathroom, performing 3/3 toileting activities with supervision + RW/grab bar. Standing at sink, pt washes hands and performs oral care with the same level of assistance. Pt does verbalize, "I feel wobbly this morning," upon further elaboration, pt shared "I feel unsteady, but not like I am going to flop over."  Pt then ambulates from room<>day room with CGA + RW, no LOB observed but patient with ataxic gait.  In day room, pt led through toe tap exercise, targeting BLE weight-shifting and coordination. Pt performs 3x10 reps, benefiting from use of colored visual targets to widen base of support upon return from tap.   Pt remained sitting in [facility] with all immediate needs met at end of session. Pt continues to be appropriate for skilled OT intervention to promote further  functional independence.   Session 2: Pt received sitting in [facility] for skilled OT session with focus on dynamic standing balance and unilateral support needed for ADL participation. Pt agreeable to interventions, demonstrating overall pleasant mood. Pt with un-rated pain. OT offering intermediate rest breaks and positioning suggestions throughout session to address pain/fatigue and maximize participation/safety in session.  Pt performs multiple STS transfers this session with close supervision + RW. Pt ambulates from room<>day room with CGA + RW. In day room, pt participates in dynamic standing activities, those noted below:  Stepping to place horseshoe onto edge of mirror, focus placed on unilateral support and alternating LE used to step forward. Participating on table top activity while standing on [employer] foam  Pt performs x2 rounds of each activity, requiring no more than CGA + RW to maintain balance, pt is observed using bilateral support intermediately for steadiness, but self-corrects for continued balance challenge during horse-shoe activity.   Pt remained sitting in [facility] with all immediate needs met at end of session. Pt continues to be appropriate for skilled OT intervention to promote further functional independence.   Therapy Documentation Precautions:  Precautions Precautions: Fall, Back Precaution Comments: pt able to state 3/3; required no cues to adhere, Back precautions posted above bed Restrictions Weight Bearing Restrictions: No   Therapy/Group: Individual Therapy  Lou Cal, OTR/L, MSOT  11/03/2022, 7:35 AM

## 2022-11-03 NOTE — Progress Notes (Signed)
PROGRESS NOTE   Subjective/Complaints:  Pt reports spasms /jerking bad of legs from ~ 6-8pm- better after received Baclofen at 8pm- but made him feel could fall out of bed, so bad.   LBM x2 yesterday with bowel program and later- all on toilet.    ROS:    Pt denies SOB, abd pain, CP, N/V/C/D, and vision changes   Except for HPI Objective:   No results found. Recent Labs    11/02/22 0537  WBC 6.4  HGB 10.6*  HCT 32.0*  PLT 153   Recent Labs    11/02/22 0537  NA 137  K 3.9  CL 103  CO2 25  GLUCOSE 90  BUN 10  CREATININE 0.84  CALCIUM 7.9*    Intake/Output Summary (Last 24 hours) at 11/03/2022 0901 Last data filed at 11/03/2022 0803 Gross per 24 hour  Intake 960 ml  Output 575 ml  Net 385 ml     Pressure Injury 10/29/22 Coccyx Mid Stage 2 -  Partial thickness loss of dermis presenting as a shallow open injury with a red, pink wound bed without slough. small area over coccyx (Active)  10/29/22 1524  Location: Coccyx  Location Orientation: Mid  Staging: Stage 2 -  Partial thickness loss of dermis presenting as a shallow open injury with a red, pink wound bed without slough.  Wound Description (Comments): small area over coccyx  Present on Admission: Yes    Physical Exam: Vital Signs Blood pressure 137/71, pulse 80, temperature 97.9 F (36.6 C), temperature source Oral, resp. rate 18, height 5\' 9"  (1.753 m), weight 89.4 kg, SpO2 95 %.   General: awake, alert, appropriate, sitting up on bed; NAD HENT: conjugate gaze; oropharynx moist CV: regular rate and rhythm; no JVD Pulmonary: CTA B/L; no W/R/R- good air movement GI: soft, NT, ND, (+)BS- normoactive Psychiatric: appropriate; interactive Neurological: Ox3  Maybe MAS of 1, few spasms seen this AM Extremities: No clubbing, cyanosis, or edema. Pulses are 2+ Psych: Pt's affect is appropriate. Pt is cooperative   PRIOR EXAMS: Skin: Thoracic  incision CDI, foam dressing on sacrum Neuro:  Alert and oriented x 3. Normal insight and awareness. Intact Memory. Normal language and speech. Cranial nerve exam unremarkable. MMT: BUE 5/5 except for left shoulder which is 4/5. BLE: HF 4-, KE 4, ADF/APF 4+ to 5/5. Decreased sense of LT and pain below the T12 level. DTR's 1+ in LE's. No abnl resting tone.   Musculoskeletal: Left shoulder limited with ABD/IR due to pain/RTC. Tenderness along thoracic spine/surgical site. Otherwise WNL    Assessment/Plan: 1. Functional deficits which require 3+ hours per day of interdisciplinary therapy in a comprehensive inpatient rehab setting. Physiatrist is providing close team supervision and 24 hour management of active medical problems listed below. Physiatrist and rehab team continue to assess barriers to discharge/monitor patient progress toward functional and medical goals  Care Tool:  Bathing    Body parts bathed by patient: Right arm, Left arm, Abdomen, Chest, Front perineal area, Buttocks, Right upper leg, Left upper leg, Right lower leg, Left lower leg, Face         Bathing assist Assist Level: Set up assist  Upper Body Dressing/Undressing Upper body dressing   What is the patient wearing?: Pull over shirt    Upper body assist Assist Level: Set up assist    Lower Body Dressing/Undressing Lower body dressing      What is the patient wearing?: Pants, Underwear/pull up     Lower body assist Assist for lower body dressing: Set up assist     Toileting Toileting    Toileting assist Assist for toileting: Contact Guard/Touching assist     Transfers Chair/bed transfer  Transfers assist     Chair/bed transfer assist level: Contact Guard/Touching assist     Locomotion Ambulation   Ambulation assist      Assist level: Minimal Assistance - Patient > 75% Assistive device: Walker-rolling Max distance: 180   Walk 10 feet activity   Assist  Walk 10 feet activity did  not occur: Safety/medical concerns (endurance after OT eval)  Assist level: Contact Guard/Touching assist Assistive device: Walker-rolling   Walk 50 feet activity   Assist Walk 50 feet with 2 turns activity did not occur: Safety/medical concerns  Assist level: Contact Guard/Touching assist Assistive device: Walker-rolling    Walk 150 feet activity   Assist Walk 150 feet activity did not occur: Safety/medical concerns  Assist level: Minimal Assistance - Patient > 75% Assistive device: Walker-rolling    Walk 10 feet on uneven surface  activity   Assist Walk 10 feet on uneven surfaces activity did not occur: Safety/medical concerns         Wheelchair     Assist Is the patient using a wheelchair?: Yes Type of Wheelchair: Manual    Wheelchair assist level: Supervision/Verbal cueing Max wheelchair distance: 150    Wheelchair 50 feet with 2 turns activity    Assist        Assist Level: Supervision/Verbal cueing   Wheelchair 150 feet activity     Assist      Assist Level: Supervision/Verbal cueing   Blood pressure 137/71, pulse 80, temperature 97.9 F (36.6 C), temperature source Oral, resp. rate 18, height 5\' 9"  (1.753 m), weight 89.4 kg, SpO2 95 %.  Medical Problem List and Plan: 1. 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 Dutch Quint on 10/15/2022.  Patient with continued lower extremity numbness and weakness and decreased control bowel bladder.             -patient may shower             -ELOS/Goals:  7-10 days, Mod I PT/OT, no SLP             Con't CIR- PT and OT  Team conference today to determine length of stay 2.  Antithrombotics: -DVT/anticoagulation:  Mechanical: Sequential compression devices, below knee Bilateral lower extremities             -antiplatelet therapy: ASA 81mg  QD 3. Pain Management: Tylenol 500 mg/hs + PRN. Norco PRN.  --continue Flexeril and  Hydrocodone prn.  --resume Voltaren gel to bilateral knees and left shoulder for chronic pain.  --LEFT knee injected a month ago at Dr. Nilsa Nutting office.--observe for gait/wb tolerance with staff/therapy  6/17- pain controlled, but jerking/spasticity is not 4. Mood/Behavior/Sleep:  LCSW to follow for evaluation and support.              -antipsychotic agents: N/A --continue Ambien prn for insomnia.--> 10/31/22 increased to 10mg  PRN since that's his home dose--improved! 6/17- sleeping much better- con't regimen 5. Neuropsych/cognition: This patient is  capable of making decisions on his own behalf. 6. Skin/Wound Care: Monitor incision for healing.  7. Fluids/Electrolytes/Nutrition: encourage po, monitor routine labs.  8. Duodenitis/Evidence of PUD: Will increase protonix 40mg  to BID for now. .  9. HTN: Monitor BP TID--continue atenolol 50mg  daily and amlodipine 10mg  daily. -6/17- BP controlled- con't regimen Vitals:   10/30/22 1251 10/30/22 2002 10/31/22 0532 10/31/22 1447  BP: 107/71 114/70 110/69 109/74   10/31/22 1938 11/01/22 0536 11/01/22 1333 11/01/22 1938  BP: 113/65 114/68 113/68 116/76   11/02/22 0521 11/02/22 1512 11/02/22 2007 11/03/22 0549  BP: 122/71 111/71 (!) 150/77 137/71    10. Suboptimal B 12 level @ 195: Continue supplement.  11. Neurogenic bowel/constipation:  Continue Miralax QD and Senna S 2 tabs BID-->am/noon             -- KUB with moderate stool burden  -Pt is fine with a daily suppository after supper. Can continue until he's has more consistent BM's. He had results last night and had another BM on his own this am. -11/01/22 LBM last night, cont regimen 6/17- 3 Bms yesterday- 1 at 2pm, and 2 with bowel program/after- will con't bowel program for now- likely will be able to stop soon.  6/18- BM with bowel program- will hold next 2 days- if goes on his own, will stop- if needs it, will teach Thursday night before d/c.  12. Hyponatremia: Na has been ranging from 128 to  133 range since 05/31.              --changed diet to regular. (No hx of CAD documented)  -6/14 sodium 133--continue current plan  6/17- Na 137 this AM 13. Leukocytosis: Has been on prednisone/steroids for 4-5 weeks followed by decadron with last admission.               --no fevers, WBC now normal.   6/17- WBC 6.4 14. Abnormal LFTs: LFT's now WNL  15. Pre diabetes w/stress induced/steroid induced hyperglycemia: Hgb A1C 5.9. Monitor, no need for insulin or CBG checks for now.  16. Neurogenic bladder/BPH: Has been toileting himself but now with frequency/voiding small amounts.   -pt reports improved sense of bladder filling pressure  -continue timed toileting --Continue flomax 0.4mg  daily 6/18- going without retention  17. Spasticity  6/17- didn't get increased tone/clonus on exam, however spasticity has circadian rhythm to it and usually worse at night, when pt having issues- will start Baclofen 10 mg at bedtime for now and monitor Sx's.   6/18- worked great when given, but spasms worse til meds at 8pm- will change Baclofen to 4pm and 11pm and monitor  I spent a total of  56  minutes on total care today- >50% coordination of care- due to d/w nursing about holding bowel program- change in spasticity meds and team conference to determine length of stay   LOS: 5 days A FACE TO FACE EVALUATION WAS PERFORMED  Vianne Grieshop 11/03/2022, 9:01 AM

## 2022-11-03 NOTE — Progress Notes (Signed)
Occupational Therapy Session Note  Patient Details  Name: Mitchell Morgan MRN: [id number] Date of Birth: Oct 04, 1945  Today's Date: 11/03/2022 OT Individual Time: 4098-1191 OT Individual Time Calculation (min): 31 min   Pt seen for skilled OT session this pm. Focus of session on simulating item retrieval for home access and dynamic balance. Pt seated in w/c upon OT arrival. OT issued RW bag and trained in safe use with amb. Pt was able to amb to hallway rail where OT set up ice, beverage, straw and cup on side. Pt was able to retreive and place in RW bag and amb back to tray table. Stood for pouring drink and set up with S only. Pt then requesting back to bed for end of session. Figure 4 for removal of socks and tennis shoes. Moved to supine with s only. Left pt bed level with no pain reported, needs in reach and bed alarm active.   Short Term Goals: Week 1:  OT Short Term Goal 1 (Week 1): STG = LTGS (d/t ELOS)  Skilled Therapeutic Interventions/Progress Updates:    Therapy Documentation Precautions:  Precautions Precautions: Fall, Back Precaution Comments: pt able to state 3/3; required no cues to adhere, Back precautions posted above bed Restrictions Weight Bearing Restrictions: No    Therapy/Group: Individual Therapy  Vicenta Dunning 11/03/2022, 7:42 AM

## 2022-11-03 NOTE — Progress Notes (Signed)
Bowel program is put on hold per MD orders

## 2022-11-04 NOTE — Progress Notes (Signed)
PROGRESS NOTE   Subjective/Complaints:  Pt reports spasms MUCH better with Baclofen- at 4pm and 11pm- had a few spasms around 10-11pm before got next dose.  No BM since Monday- hasn't had a BM without bowel program last night- agreed that if doesn't have BM by 6pm, will do bowel program and learn for going home, if needed.   Rested better.  Didn't even have to take Norco.   ROS:    Pt denies SOB, abd pain, CP, N/V/C/D, and vision changes   Except for HPI Objective:   No results found. Recent Labs    11/02/22 0537  WBC 6.4  HGB 10.6*  HCT 32.0*  PLT 153   Recent Labs    11/02/22 0537  NA 137  K 3.9  CL 103  CO2 25  GLUCOSE 90  BUN 10  CREATININE 0.84  CALCIUM 7.9*    Intake/Output Summary (Last 24 hours) at 11/04/2022 0911 Last data filed at 11/04/2022 0730 Gross per 24 hour  Intake 597 ml  Output 600 ml  Net -3 ml     Pressure Injury 10/29/22 Coccyx Mid Stage 2 -  Partial thickness loss of dermis presenting as a shallow open injury with a red, pink wound bed without slough. small area over coccyx (Active)  10/29/22 1524  Location: Coccyx  Location Orientation: Mid  Staging: Stage 2 -  Partial thickness loss of dermis presenting as a shallow open injury with a red, pink wound bed without slough.  Wound Description (Comments): small area over coccyx  Present on Admission: Yes    Physical Exam: Vital Signs Blood pressure 134/79, pulse 89, temperature 98.3 F (36.8 C), resp. rate 16, height 5\' 9"  (1.753 m), weight 89.4 kg, SpO2 96 %.    General: awake, alert, appropriate, sitting EOB; NAD HENT: conjugate gaze; oropharynx moist CV: regular rate; no JVD Pulmonary: CTA B/L; no W/R/R- good air movement GI: soft, NT, ND, (+)BS Psychiatric: appropriate Neurological: Ox3  Maybe MAS of 1, no spasms seen this AM Extremities: No clubbing, cyanosis, or edema. Pulses are 2+ Psych: Pt's affect is  appropriate. Pt is cooperative   PRIOR EXAMS: Skin: Thoracic incision CDI, foam dressing on sacrum Neuro:  Alert and oriented x 3. Normal insight and awareness. Intact Memory. Normal language and speech. Cranial nerve exam unremarkable. MMT: BUE 5/5 except for left shoulder which is 4/5. BLE: HF 4-, KE 4, ADF/APF 4+ to 5/5. Decreased sense of LT and pain below the T12 level. DTR's 1+ in LE's. No abnl resting tone.   Musculoskeletal: Left shoulder limited with ABD/IR due to pain/RTC. Tenderness along thoracic spine/surgical site. Otherwise WNL    Assessment/Plan: 1. Functional deficits which require 3+ hours per day of interdisciplinary therapy in a comprehensive inpatient rehab setting. Physiatrist is providing close team supervision and 24 hour management of active medical problems listed below. Physiatrist and rehab team continue to assess barriers to discharge/monitor patient progress toward functional and medical goals  Care Tool:  Bathing    Body parts bathed by patient: Right arm, Left arm, Abdomen, Chest, Front perineal area, Buttocks, Right upper leg, Left upper leg, Right lower leg, Left lower leg, Face  Bathing assist Assist Level: Set up assist     Upper Body Dressing/Undressing Upper body dressing   What is the patient wearing?: Pull over shirt    Upper body assist Assist Level: Independent    Lower Body Dressing/Undressing Lower body dressing      What is the patient wearing?: Pants, Underwear/pull up     Lower body assist Assist for lower body dressing: Supervision/Verbal cueing     Toileting Toileting    Toileting assist Assist for toileting: Contact Guard/Touching assist     Transfers Chair/bed transfer  Transfers assist     Chair/bed transfer assist level: Contact Guard/Touching assist     Locomotion Ambulation   Ambulation assist      Assist level: Minimal Assistance - Patient > 75% Assistive device: Walker-rolling Max  distance: 180   Walk 10 feet activity   Assist  Walk 10 feet activity did not occur: Safety/medical concerns (endurance after OT eval)  Assist level: Contact Guard/Touching assist Assistive device: Walker-rolling   Walk 50 feet activity   Assist Walk 50 feet with 2 turns activity did not occur: Safety/medical concerns  Assist level: Contact Guard/Touching assist Assistive device: Walker-rolling    Walk 150 feet activity   Assist Walk 150 feet activity did not occur: Safety/medical concerns  Assist level: Minimal Assistance - Patient > 75% Assistive device: Walker-rolling    Walk 10 feet on uneven surface  activity   Assist Walk 10 feet on uneven surfaces activity did not occur: Safety/medical concerns         Wheelchair     Assist Is the patient using a wheelchair?: Yes Type of Wheelchair: Manual    Wheelchair assist level: Supervision/Verbal cueing Max wheelchair distance: 150    Wheelchair 50 feet with 2 turns activity    Assist        Assist Level: Supervision/Verbal cueing   Wheelchair 150 feet activity     Assist      Assist Level: Supervision/Verbal cueing   Blood pressure 134/79, pulse 89, temperature 98.3 F (36.8 C), resp. rate 16, height 5\' 9"  (1.753 m), weight 89.4 kg, SpO2 96 %.  Medical Problem List and Plan: 1. 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 Dutch Quint on 10/15/2022.  Patient with continued lower extremity numbness and weakness and decreased control bowel bladder.             -patient may shower             -ELOS/Goals:  7-10 days, Mod I PT/OT, no SLP             d/c 6/21 Con't CIR PT and OT 2.  Antithrombotics: -DVT/anticoagulation:  Mechanical: Sequential compression devices, below knee Bilateral lower extremities             -antiplatelet therapy: ASA 81mg  QD 3. Pain Management: Tylenol 500 mg/hs + PRN. Norco PRN.  --continue  Flexeril and Hydrocodone prn.  --resume Voltaren gel to bilateral knees and left shoulder for chronic pain.  --LEFT knee injected a month ago at Dr. Nilsa Nutting office.--observe for gait/wb tolerance with staff/therapy  6/17- pain controlled, but jerking/spasticity is not 4. Mood/Behavior/Sleep:  LCSW to follow for evaluation and support.              -antipsychotic agents: N/A --continue Ambien prn for insomnia.--> 10/31/22 increased to 10mg  PRN since that's his home dose--improved! 6/17- sleeping much better- con't regimen 5. Neuropsych/cognition: This patient is capable of  making decisions on his own behalf. 6. Skin/Wound Care: Monitor incision for healing.  7. Fluids/Electrolytes/Nutrition: encourage po, monitor routine labs.  8. Duodenitis/Evidence of PUD: Will increase protonix 40mg  to BID for now. .  9. HTN: Monitor BP TID--continue atenolol 50mg  daily and amlodipine 10mg  daily. -6/17- BP controlled- con't regimen Vitals:   10/31/22 1447 10/31/22 1938 11/01/22 0536 11/01/22 1333  BP: 109/74 113/65 114/68 113/68   11/01/22 1938 11/02/22 0521 11/02/22 1512 11/02/22 2007  BP: 116/76 122/71 111/71 (!) 150/77   11/03/22 0549 11/03/22 1410 11/03/22 1935 11/04/22 0425  BP: 137/71 116/71 (!) 150/82 134/79    10. Suboptimal B 12 level @ 195: Continue supplement.  11. Neurogenic bowel/constipation:  Continue Miralax QD and Senna S 2 tabs BID-->am/noon             -- KUB with moderate stool burden  -Pt is fine with a daily suppository after supper. Can continue until he's has more consistent BM's. He had results last night and had another BM on his own this am. -11/01/22 LBM last night, cont regimen 6/17- 3 Bms yesterday- 1 at 2pm, and 2 with bowel program/after- will con't bowel program for now- likely will be able to stop soon.  6/18- BM with bowel program- will hold next 2 days- if goes on his own, will stop- if needs it, will teach Thursday night before d/c.   6/19- no BM last night- if doesn't  go by 6pm, will need bowel porgram- let charge nurse know- also needs education on bowel program 12. Hyponatremia: Na has been ranging from 128 to 133 range since 05/31.              --changed diet to regular. (No hx of CAD documented)  -6/14 sodium 133--continue current plan  6/17- Na 137 this AM 13. Leukocytosis: Has been on prednisone/steroids for 4-5 weeks followed by decadron with last admission.               --no fevers, WBC now normal.   6/17- WBC 6.4 14. Abnormal LFTs: LFT's now WNL  15. Pre diabetes w/stress induced/steroid induced hyperglycemia: Hgb A1C 5.9. Monitor, no need for insulin or CBG checks for now.  16. Neurogenic bladder/BPH: Has been toileting himself but now with frequency/voiding small amounts.   -pt reports improved sense of bladder filling pressure  -continue timed toileting --Continue flomax 0.4mg  daily 6/18- going without retention  17. Spasticity  6/17- didn't get increased tone/clonus on exam, however spasticity has circadian rhythm to it and usually worse at night, when pt having issues- will start Baclofen 10 mg at bedtime for now and monitor Sx's.   6/18- worked great when given, but spasms worse til meds at 8pm- will change Baclofen to 4pm and 11pm and monitor  6/19- educated on spasticity- ways to help with ROM and techniques to calm down- also educated on progressive- up to 1+ years- and will need to call me in office to get meds if it progresses which is likely  I spent a total of 51   minutes on total care today- >50% coordination of care- due to d/w charge nurse can do bowel program this evening/and needs to be taught/educated on bowel program if no BM by 6pm- since going home Friday, needs to be taught if not going on his own- also educated on spasticity for prolonged time. In room 30 minutes today   LOS: 6 days A FACE TO FACE EVALUATION WAS PERFORMED  Georgianne Gritz 11/04/2022, 9:11  AM

## 2022-11-04 NOTE — Progress Notes (Signed)
Physical Therapy Session Note  Patient Details  Name: CYRIC WILFERT MRN: [id number] Date of Birth: [birth year]  Today's Date: 11/04/2022 PT Individual Time: 1415-1455 PT Individual Time Calculation (min): 40 min   Short Term Goals: Week 1:  PT Short Term Goal 1 (Week 1): =LTGs d/t ELOS  Skilled Therapeutic Interventions/Progress Updates:    Chart reviewed and pt agreeable to therapy. Pt received semi-reclined in bed with no c/o pain. Session focused on curb navigation, progression of amb independence, and education on care continuum after d/c to promote safe home access and long term recovery. Pt initiated session with amb to toilet and then 263ft to therapy gym using S + RW. Pt then completed blocked practice of 1 step on 5" curb with no rails using RW to replicate threshold step of home. Pt able to demonstrate safe DME use and completed step with S + RW. Pt then completed blocked practice of 110ft amb with MinA + no AD. Session education emphasized continued therapy to OP PT after [facility] PT to encourage return to PLOF and also continuous annual check in with OP PT to maintain balance over the lifespan. Pt amb 259ft to return to room with S + RW and same assist to return to bed. At end of session, pt was left semi-reclined in bed with alarm engaged, nurse call bell and all needs in reach.     Therapy Documentation Precautions:  Precautions Precautions: Fall, Back Precaution Comments: pt able to state 3/3; required no cues to adhere, Back precautions posted above bed Restrictions Weight Bearing Restrictions: No General:       Therapy/Group: Individual Therapy  Dionne Milo 11/04/2022, 3:54 PM

## 2022-11-04 NOTE — Progress Notes (Signed)
Physical Therapy Session Note  Patient Details  Name: Mitchell Morgan MRN: [id number] Date of Birth: January 12, 1946  Today's Date: 11/04/2022 PT Individual Time: 7846-9629 PT Individual Time Calculation (min): 58 min   Short Term Goals: Week 1:  PT Short Term Goal 1 (Week 1): =LTGs d/t ELOS  Skilled Therapeutic Interventions/Progress Updates:      Pt supine in bed upon arrival. Pt agreeable to therapy. Pt denies any pain.   Pt performed supine to sit with supervision. Pt donned and doffed socks and shoes with mod I while sitting EOB.  Pt ambulated to bathroom and performed ambulatory transfer to toilet with RW and supervision. Pt continent of bladder. Pt donned and doffed pants with CGA and performed peri care in sitting with mod I.   Pt ambulated from room to main gym with RW and CGA, verbal cues provided for forward gaze and safety with RW.   Pt performed 2x10 bilateral toe taps on squishy cone, pt initially performed with R HHA and no AD, with max A and significant ataxia, adjusted with therapist providing R HHA and L UE assist on chair for safety , verbal cues provided for light toe taps. Pt demos 2 episodes of R LE buckling with fatigue.   Pt performed the following gait with CGA, verbal cues provided for upright posture, forward gaze and to not cross LE:  3x35 feet with R UE support on handrail--> progressing to light R UE assist  3x35 feet with L UE support on handrail, progressing to light L UE assist  2x35 feet backwards ambulation, first trial with R UE support on arm rail and L HHA, 2nd trial with R UE support on arm rail only and CGA 1x35 feet lateral steps bilaterally with B UE support on hallway rail and CGA, verbal cues provided for upright posture and compensation reduction.   Pt performed sit<>stand x10 with CGA and arms across chest, verbal cues provided to scoot to edge of chair to reduce back of legs support on chair.  Pt seated in WC at end of session with all needs  within reach and seatbelt alarm on.    Therapy Documentation Precautions:  Precautions Precautions: Fall, Back Precaution Comments: pt able to state 3/3; required no cues to adhere, Back precautions posted above bed Restrictions Weight Bearing Restrictions: No Therapy/Group: Individual Therapy  [facility] Ambrose Finland, [place], DPT  11/04/2022, 7:38 AM

## 2022-11-04 NOTE — Progress Notes (Signed)
Occupational Therapy Session Note  Patient Details  Name: Mitchell Morgan MRN: [id number] Date of Birth: May 11, 1946  Today's Date: 11/04/2022 OT Individual Time: 1015-1100 OT Individual Time Calculation (min): 45 min    Short Term Goals: Week 1:  OT Short Term Goal 1 (Week 1): STG = LTGS (d/t ELOS)  Skilled Therapeutic Interventions/Progress Updates:   Pt seen for skilled OT this am. Pt reports feeling fatigued from 3 therapy visits prior. OT transported pt to and from far ortho gym for energy conservation via w/c. OT educated and made copiy for reference for BORG scale. Pt self rated Borg RPE at 12-13 (somewhat hard for seated SCI Fit 4 sets of 2 min forward and backwards on L1 with rest break between sets. Amb with RW from doorway of room to and from toilet to sink side standing then bed level for rest with close S. Pt able to complete BM and voiding including clothing mngt and hygiene also with S only. Flowsheet updated for data. Pt left bed level with needs and bed exit engaged.   Pain: 4/10 B LE's and back with releif with rest and repositioning   Therapy Documentation Precautions:  Precautions Precautions: Fall, Back Precaution Comments: pt able to state 3/3; required no cues to adhere, Back precautions posted above bed Restrictions Weight Bearing Restrictions: No    Therapy/Group: Individual Therapy  Vicenta Dunning 11/04/2022, 7:48 AM

## 2022-11-04 NOTE — Progress Notes (Signed)
Occupational Therapy Session Note  Patient Details  Name: DESMOND MCEWAN MRN: [id number] Date of Birth: [birth year]  Today's Date: 11/04/2022 OT Individual Time: 4098-1191 OT Individual Time Calculation (min): 73 min    Short Term Goals: Week 1:  OT Short Term Goal 1 (Week 1): STG = LTGS (d/t ELOS)  Skilled Therapeutic Interventions/Progress Updates:    OT intervention with focus on funcitonal amb with RW, bathing at shower level, dressing with sit<>stand from seat, simulated walk in shower transfers, standing balance, and activity tolerance to increase independence with BADLs. Pt amb with RW in room to enter bathroom for shower. Bathing and dressing with sit<>stand at supervision level. Pt reported that showers "feel good" but a "little tiring." Pt amb with RW to gym and practiced simulated shower transfers with shower frame-supervision. Pt amb with RW to ortho gym. 7 mins NuStep level 4. Standing activity at [facility] without UE support-supervision. Pt amb with RW back to room and requested to rest in bed. Pt remained in bed with all needs within reach. Bed alarm activated.   Therapy Documentation Precautions:  Precautions Precautions: Fall, Back Precaution Comments: pt able to state 3/3; required no cues to adhere, Back precautions posted above bed Restrictions Weight Bearing Restrictions: No   Pain: Pain Assessment Pain Scale: 0-10 Pain Score: 0-No pain   Therapy/Group: Individual Therapy  Rich Brave 11/04/2022, 9:03 AM

## 2022-11-04 NOTE — Progress Notes (Signed)
Pt had a large bm this AM with therapist and stated did not need to do bowel program tonight

## 2022-11-05 MED ORDER — PANTOPRAZOLE SODIUM 40 MG PO TBEC: 40.0000 mg | DELAYED_RELEASE_TABLET | Freq: Two times a day (BID) | ORAL | 0 refills | Status: AC

## 2022-11-05 MED ORDER — HYDROCODONE-ACETAMINOPHEN 10-325 MG PO TABS: 1.0000 | ORAL_TABLET | Freq: Four times a day (QID) | ORAL | 0 refills | Status: DC | PRN

## 2022-11-05 MED ORDER — CYCLOBENZAPRINE HCL 10 MG PO TABS: 10.0000 mg | ORAL_TABLET | Freq: Three times a day (TID) | ORAL | 0 refills | Status: DC | PRN

## 2022-11-05 MED ORDER — DICLOFENAC SODIUM 1 % EX GEL: 2.0000 g | Freq: Three times a day (TID) | CUTANEOUS | 0 refills | Status: AC

## 2022-11-05 MED ORDER — CYANOCOBALAMIN 1000 MCG PO TABS: 1000.0000 ug | ORAL_TABLET | Freq: Every day | ORAL | Status: DC

## 2022-11-05 MED ORDER — TAMSULOSIN HCL 0.4 MG PO CAPS: 0.4000 mg | ORAL_CAPSULE | Freq: Every day | ORAL | 0 refills | Status: DC

## 2022-11-05 MED ORDER — POLYETHYLENE GLYCOL 3350 17 GM/SCOOP PO POWD: 17.0000 g | Freq: Every day | ORAL | 0 refills | Status: AC

## 2022-11-05 MED ORDER — BACLOFEN 10 MG PO TABS: 10.0000 mg | ORAL_TABLET | Freq: Two times a day (BID) | ORAL | 0 refills | Status: DC

## 2022-11-05 MED ORDER — SENNOSIDES-DOCUSATE SODIUM 8.6-50 MG PO TABS: 2.0000 | ORAL_TABLET | Freq: Two times a day (BID) | ORAL | 0 refills | Status: DC

## 2022-11-05 NOTE — Progress Notes (Signed)
Patient ID: Mitchell Morgan, male   DOB: 17-Apr-1946, 77 y.o.   MRN: [id number]  0947-SW spoke with Cheryl/[facility] SW reporting that all his DME was ordered and is being shipped to the home. Unsure on when the items will arrive. Suggested pt use [employer] insurance if unable to wait. Will follow-upi once there is an ETA. Pt will continue to use his [name] for [facility] needs and outpatient.   1210-SW spoke with pt to inform on above. Feels his current bathroom setup at his son's home can accommodate his needs until the DME arrives. Reminded [facility] is [facility].   SW spoke with his wife Darel Hong to inform on above, and review discharge. She will be in for family edu today. No questions/concerns reported.   Cecile Sheerer, MSW, LCSWA Office: [phone] Cell: [phone] Fax: [phone]

## 2022-11-05 NOTE — Progress Notes (Signed)
Occupational Therapy Discharge Summary  Patient Details  Name: Mitchell Morgan MRN: [id number] Date of Birth: [birth year]  Date of Discharge from OT service:November 05, 2022  Patient has met 8 of 8 long term goals due to improved activity tolerance, improved balance, postural control, ability to compensate for deficits, and improved coordination.  Pt made excellent progress with BADLs, IADLs, and functional transfers during this admission. Pt requires supervision for shower transfers but completes all other tasks at mod I level. Pt's wife has been present and participated in therapy sessions. Patient to discharge at overall Supervision level.  Patient's care partner is independent to provide the necessary physical assistance at discharge.    Reasons goals not met: n/a  Recommendation:  Patient will benefit from ongoing skilled OT services in home health setting to continue to advance functional skills in the area of BADL, iADL, and Reduce care partner burden.  Equipment: Information systems manager provided by [employer]  Reasons for discharge: treatment goals met  Patient/family agrees with progress made and goals achieved: Yes  OT Discharge ADL ADL Equipment Provided: Reacher, Long-handled sponge Eating: Independent Where Assessed-Eating: Chair Grooming: Independent Where Assessed-Grooming: Sitting at sink Upper Body Bathing: Modified independent Where Assessed-Upper Body Bathing: Shower Lower Body Bathing: Modified independent Where Assessed-Lower Body Bathing: Shower Upper Body Dressing: Independent Where Assessed-Upper Body Dressing: Chair Lower Body Dressing: Modified independent Where Assessed-Lower Body Dressing: Standing at sink, Sitting at sink Toileting: Modified independent Where Assessed-Toileting: Neurosurgeon Method: Proofreader: Bedside commode, Acupuncturist: Distant supervision Training and development officer Method: Designer, industrial/product: Grab bars Vision Baseline Vision/History: 1 Wears glasses Patient Visual Report: No change from baseline Vision Assessment?: No apparent visual deficits Perception  Perception: Within Functional Limits Praxis Praxis: Intact Cognition Cognition Overall Cognitive Status: Within Functional Limits for tasks assessed Arousal/Alertness: Awake/alert Orientation Level: Person;Place;Situation Person: Oriented Place: Oriented Situation: Oriented Memory: Appears intact Awareness: Appears intact Problem Solving: Appears intact Safety/Judgment: Appears intact Brief Interview for Mental Status (BIMS) Repetition of Three Words (First Attempt): 3 Temporal Orientation: Year: Correct Temporal Orientation: Month: Accurate within 5 days Temporal Orientation: Day: Correct Recall: "Sock": Yes, no cue required Recall: "Blue": Yes, no cue required Recall: "Bed": Yes, no cue required BIMS Summary Score: 15 Sensation Sensation Light Touch: Impaired Detail Peripheral sensation comments: BLE diminished, ~40% Light Touch Impaired Details: Impaired RLE;Impaired LLE Hot/Cold: Appears Intact Proprioception: Appears Intact Stereognosis: Impaired by gross assessment Coordination Gross Motor Movements are Fluid and Coordinated: Yes Fine Motor Movements are Fluid and Coordinated: Yes Motor  Motor Motor: Paraplegia Motor - Skilled Clinical Observations: 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 Mobility     Trunk/Postural Assessment  Cervical Assessment Cervical Assessment: Within Functional Limits Thoracic Assessment Thoracic Assessment: Exceptions to [facility] (back precautions) Lumbar Assessment Lumbar Assessment: Exceptions to [facility] (back precautions) Postural Control Postural Control: Deficits on evaluation Trunk Control: delayed due to recent back surgery  Balance Static Sitting Balance Static Sitting - Balance Support: Feet supported Static Sitting -  Level of Assistance: 6: Modified independent (Device/Increase time) Dynamic Sitting Balance Dynamic Sitting - Balance Support: During functional activity Dynamic Sitting - Level of Assistance: 6: Modified independent (Device/Increase time) Extremity/Trunk Assessment RUE Assessment RUE Assessment: Within Functional Limits LUE Assessment LUE Assessment: Within Functional Limits   Rich Brave 11/05/2022, 6:58 AM

## 2022-11-05 NOTE — Progress Notes (Signed)
Occupational Therapy Session Note  Patient Details  Name: Mitchell Morgan MRN: [id number] Date of Birth: June 06, 1945  Today's Date: 11/05/2022 OT Individual Time: 1300-1400 OT Individual Time Calculation (min): 60 min    Short Term Goals: Week 1:  OT Short Term Goal 1 (Week 1): STG = LTGS (d/t ELOS)  Skilled Therapeutic Interventions/Progress Updates:    Pt seen for skilled OT this pm for focus on Family Education. Pt's wife and son present for education and completed hands on handling and demo indep for d/c safety. Pt completed toileting with wife assist with mod I and gait belt use just for safety. Transported to main gym for time mngt. Wife and son able to amb pt with Rw with shopping care with S as pt with high interest to retrun to activity. Then stepping in and out of shower frame threshold to shower seat to miror home set up. Pt required S only and wife and son demo indep level. Returned back to room and pt completed high bed transfer to mirror home bed transfer set at 31 1/2" with mod I including flat bed and LE mngt. Left pt and family with all questions answered, light UE HEP trng completed and no further CIR rehab needs. Bed exit set, needs and nurse call button in reach.   Pain: denies all pain   Therapy Documentation Precautions:  Precautions Precautions: Fall, Back Precaution Comments: pt able to state 3/3; required no cues to adhere, Back precautions posted above bed Restrictions Weight Bearing Restrictions: No    Therapy/Group: Individual Therapy  Vicenta Dunning 11/05/2022, 7:49 AM

## 2022-11-05 NOTE — Progress Notes (Signed)
Physical Therapy Discharge Summary  Patient Details  Name: Mitchell Morgan MRN: [id number] Date of Birth: [birth year]  Date of Discharge from PT service:November 05, 2022  Today's Date: 11/05/2022 PT Individual Time:1015-1100, 4098-1191 PT Individual Time Calculation (min): 45 min, 43 min    Patient has met 7 of 7 long term goals due to improved activity tolerance, improved balance, improved postural control, increased strength, and improved coordination.  Patient to discharge at an ambulatory level Supervision.   Patient's care partner is independent to provide the necessary physical assistance at discharge. Pt returning home with his wife and son. Pt is at supervision level for gait and mod I for transfers.  Reasons goals not met: NA  Recommendation:  Patient will benefit from ongoing skilled PT services in home health setting to continue to advance safe functional mobility, address ongoing impairments in strength, balance, coordination, and minimize fall risk.  Equipment: No PT equipment  Reasons for discharge: treatment goals met and discharge from hospital  Patient/family agrees with progress made and goals achieved: Yes  Skilled Therapeutic Interventions/Progress Updates: Session 1: pt received in bed and agreeable to therapy. Pt reports pain controlled on medication this session. Bed mobility mod I with bed rail. ambulatory transfer to bathroom with supervision, supervision for 3/3 toileting tasks. Pt ambulated throughout session with RW and supervision. Note continued mild ataxia, L>R, but able to manage even with fatigue and minimal cueing. Discussed energy conservation and rest breaks. Session focused on d/c assessments as documented below. Pt performed car transfer, curb navigation, stairs, ramp, MMT and sensory testing as listed below. Discussed fall recovery and activating EMS when appropriate, did not trial d/t back precautions. Returned to room and to bed with supervision, was  left with all needs in reach and alarm active.   Session 2: pt received in bed and agreeable to therapy. Pt reports pain controlled on medication this session. Bed mobility mod I with bed rail. Pt's family members present for family education. Pt ambulated throughout session at supervision level with RW. Educated family members on things to watch for and appropriate assist levels. Stair navigation and curb at CGA level, pt performed 3 x each to allow practice for each family member. Demoed car transfer at supervision level. Discussed pt's supervision vs CGA level assist, all expressed understanding. Pt returned to room and to bed, removing shoes with supervision and was left with all needs in reach and alarm active.   PT Discharge Precautions/Restrictions Precautions Precautions: Fall;Back Precaution Comments: pt able to state 3/3; required no cues to adhere, Back precautions posted above bed Restrictions Weight Bearing Restrictions: No Vital Signs Therapy Vitals Temp: 98 F (36.7 C) Temp Source: Oral Pulse Rate: 78 Resp: 18 BP: 136/70 Patient Position (if appropriate): Lying Oxygen Therapy SpO2: 99 % O2 Device: Room Air  Pain Interference Pain Interference Pain Effect on Sleep: 1. Rarely or not at all Pain Interference with Therapy Activities: 1. Rarely or not at all Pain Interference with Day-to-Day Activities: 1. Rarely or not at all Vision/Perception  Vision - History Ability to See in Adequate Light: 0 Adequate Perception Perception: Within Functional Limits Praxis Praxis: Intact  Cognition Overall Cognitive Status: Within Functional Limits for tasks assessed Arousal/Alertness: Awake/alert Orientation Level: Oriented X4 Day of Week: Correct Memory: Appears intact Awareness: Appears intact Problem Solving: Appears intact Safety/Judgment: Appears intact Sensation Sensation Light Touch: Impaired Detail Peripheral sensation comments: BLE diminished, ~40% Light Touch  Impaired Details: Impaired RLE;Impaired LLE Coordination Gross Motor Movements are Fluid and  Coordinated: Yes Fine Motor Movements are Fluid and Coordinated: Yes Motor  Motor Motor: Paraplegia Motor - Skilled Clinical Observations: 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 Motor - Discharge Observations: Improved from baseline, but continued BLE weakness, L>R  Mobility Bed Mobility Bed Mobility: Supine to Sit Supine to Sit: Independent with assistive device Transfers Transfers: Sit to Stand;Stand to Sit;Transfer Sit to Stand: Independent with assistive device Stand to Sit: Independent with assistive device Transfer (Assistive device): Rolling walker Locomotion  Gait Ambulation: Yes Gait Assistance: Supervision/Verbal cueing Gait Distance (Feet): 180 Feet Assistive device: Rolling walker Gait Gait: Yes Gait Pattern: Impaired Gait Pattern: Ataxic Stairs / Additional Locomotion Stairs: Yes Stairs Assistance: Contact Guard/Touching assist Stair Management Technique: Two rails Number of Stairs: 12 Height of Stairs: 6 Ramp: Supervision/Verbal cueing Curb: Contact Guard/Touching assist Pick up small object from the floor assist level: Supervision/Verbal cueing Wheelchair Mobility Wheelchair Mobility: No  Trunk/Postural Assessment  Cervical Assessment Cervical Assessment: Within Functional Limits Thoracic Assessment Thoracic Assessment: Exceptions to [facility] Lumbar Assessment Lumbar Assessment: Exceptions to [facility] Postural Control Postural Control: Deficits on evaluation Trunk Control: delayed due to recent back surgery  Balance Balance Balance Assessed: Yes Static Sitting Balance Static Sitting - Balance Support: Feet supported Static Sitting - Level of Assistance: 6: Modified independent (Device/Increase time) Dynamic Sitting Balance Dynamic Sitting - Balance Support: During functional activity Dynamic Sitting - Level of Assistance: 6: Modified independent (Device/Increase  time) Static Standing Balance Static Standing - Balance Support: Bilateral upper extremity supported;During functional activity Static Standing - Level of Assistance: 5: Stand by assistance Dynamic Standing Balance Dynamic Standing - Balance Support: During functional activity;Left upper extremity supported;Right upper extremity supported Dynamic Standing - Level of Assistance: 5: Stand by assistance Extremity Assessment      RLE Assessment RLE Assessment: Exceptions to [facility] RLE Strength Right Hip Flexion: 3+/5 Right Knee Flexion: 4-/5 Right Knee Extension: 4-/5 Right Ankle Dorsiflexion: 4/5 Right Ankle Plantar Flexion: 4/5 LLE Assessment LLE Assessment: Exceptions to [facility] LLE Strength Left Hip Flexion: 3/5 Left Knee Flexion: 4-/5 Left Knee Extension: 3/5 Left Ankle Dorsiflexion: 4/5 Left Ankle Plantar Flexion: 4/5   Avynn Klassen C Vika Buske 11/05/2022, 3:34 PM

## 2022-11-05 NOTE — Discharge Summary (Signed)
Physician Discharge Summary  Patient ID: RAIFORD FETTERMAN MRN: [id number] DOB/AGE: February 05, 1946 77 y.o.  Admit date: 10/29/2022 Discharge date: 11/06/2022   Discharge Diagnoses:  Principal Problem:   Thoracic myelopathy Active Problems:   Duodenitis without bleeding   Neurogenic bladder   Essential hypertension   Neurogenic bowel   Acute blood loss anemia   Spasticity   Discharged Condition: stable  Significant Diagnostic Studies: DG Abd 1 View  Result Date: 10/29/2022 CLINICAL DATA:  Possible constipation EXAM: ABDOMEN - 1 VIEW COMPARISON:  10/25/2022 CT FINDINGS: Scattered large and small bowel gas is noted. Moderate retained fecal material is noted throughout the colon without obstructive change. The overall appearance is similar to that seen on recent CT. No free air is noted. Degenerative changes of lumbar spine are seen. IMPRESSION: Moderate retained fecal material stable from prior CT consistent with a degree of constipation. Electronically Signed   By: Alcide Clever M.D.   On: 10/29/2022 19:35        Labs:  Basic Metabolic Panel:    Latest Ref Rng & Units 11/02/2022    5:37 AM 10/30/2022    4:45 AM 10/25/2022    9:25 PM  BMP  Glucose 70 - 99 mg/dL 90  782  956   BUN 8 - 23 mg/dL 10  11  19    Creatinine 0.61 - 1.24 mg/dL 2.13  0.86  5.78   Sodium 135 - 145 mmol/L 137  133  128   Potassium 3.5 - 5.1 mmol/L 3.9  3.7  4.9   Chloride 98 - 111 mmol/L 103  98  92   CO2 22 - 32 mmol/L 25  27  23    Calcium 8.9 - 10.3 mg/dL 7.9  8.3  9.1      CBC:    Latest Ref Rng & Units 11/02/2022    5:37 AM 10/30/2022    4:45 AM 10/25/2022    9:25 PM  CBC  WBC 4.0 - 10.5 K/uL 6.4  9.4  13.0   Hemoglobin 13.0 - 17.0 g/dL 46.9  62.9  52.8   Hematocrit 39.0 - 52.0 % 32.0  35.9  45.3   Platelets 150 - 400 K/uL 153  163  237      CBG: No results for input(s): "GLUCAP" in the last 168 hours.  Brief HPI:   Mitchell Morgan is a 77 y.o. male with history of HTN, obesity, back pain with  falls, unsteady gait progressing to difficulty with voiding and bowel incontinence due to lack of peritoneal sensation.  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  His symptoms were felt to be due to thoracic stenosis and myelopathy, he was started on Decadron and underwent T10-T11 decompressive laminectomy by Dr. Dutch Quint.  He was also found to have low normal B12 levels and was started on supplement.  He was making progress with therapy on acute therefore was discharged to home on 06/06.  However he continued to have difficulty with balance as well as difficulty emptying his bowels and was admitted on 06/09 with new onset of upper abdominal pain with decrease in p.o. intake.  He was found to be hyponatremic with sodium at 132 and CT abdomen pelvis showed moderate thickening of duodenum suspicious for duodenitis and PUD and enlarged prostate and stable decompression without postop fluid collection.Marland Kitchen  He was started on PPI but  continued to report issues with constipation as well as difficulty voiding due to decreased sensation of bladder fullness as well as hesitancy.  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  Patient was independent prior to onset of back issues in May.  CIR was recommended due to functional decline.   Hospital Course: KAYL STOGDILL was admitted to rehab 10/29/2022 for inpatient therapies to consist of PT and OT at least three hours five days a week. Past admission physiatrist, therapy team and rehab RN have worked together to provide customized collaborative inpatient rehab.  SCDs were used for DVT prophylaxis during his stay.  Voltaren gel was resumed to bilateral knees and left shoulder due to issues with chronic pain.  Flomax was  added to help with voiding function.  KUB done showed moderate stool burden and he was started on bowel program with suppository at night to help with evacuation.  His PPI was increased to twice daily due to evidence of duodenitis/PUD.  Fasting hyperglycemia noted likely due to stress reaction as hemoglobin A1c within normal limits. @5 .9.  He was found to have partial-thickness stage II ulcer on coccyx that has been treated with local measures. Follow up CBC showed drop in H/H without signs of bleeding. No GI symptoms reported and his intake has been good. Repeat check of  BMET showed hyponatremia has resolved and renal status is stable.   His blood pressures were monitored on TID basis and has been stable.  Baclofen was added at night to help manage spasticity with Zykadia and rhythm and he has been educated on ways to help with the range of motion techniques to calm it down.  Hydrocodone has been used on as needed basis to help manage pain with decrease in use prior to discharge.  His back incision was noted to be clean, dry and intact and healing well without signs or symptoms of infection.  He has had improvement in sensation of bladder filling pressures and is voiding without retention.  His bowel function has improved without need for bowel program by discharge.  He was advised to continue using oral laxatives due to ongoing need of baclofen.  He has made good gains during his rehab stay and supervision is recommended with mobility.  He will continue to receive follow-up home health PT, OT and aide by by her home health after discharge.     Rehab course: During patient's stay in rehab weekly team conferences were held to monitor patient's progress, set goals and discuss barriers to discharge. At admission, patient required min assist with basic ADL tasks and with mobility. He  has had improvement in activity tolerance, balance, postural control as well as ability to compensate for deficits. He is able to  complete ADL tasks at modified independent level and requires supervision with showers. He is independent for transfers and is able to ambulate 180' with supervision and use of RW.   Disposition: Home.   Diet: Regular.   Wound care: Keep back incision clean and ry Cover area on buttock with Band-Aid.     Special Instructions: No driving or strenuous activity till cleared by MD.  2.  Recommend recheck of CBC in 5-7 days to  monitor for stability of H/H.   Discharge Instructions     Ambulatory referral to Physical Medicine Rehab   Complete by: As directed    Hospital follow up  Allergies as of 11/06/2022       Reactions   Iodine Itching, Swelling   Red Eyes, Watery    Pravastatin Other (See Comments)   myalgia   Shrimp [shellfish Allergy] Itching, Swelling   Lobster, crab Swollen eyes   Simvastatin Other (See Comments)   myalgia        Medication List     STOP taking these medications    dexamethasone 4 MG tablet Commonly known as: DECADRON       TAKE these medications    acetaminophen 325 MG tablet Commonly known as: TYLENOL Take 1-2 tablets (325-650 mg total) by mouth every 4 (four) hours as needed for mild pain. What changed:  medication strength how much to take when to take this reasons to take this   amLODipine 10 MG tablet Commonly known as: NORVASC Take 10 mg by mouth daily.   Aspirin 81 81 MG tablet Generic drug: aspirin EC Take 81 mg by mouth daily.   atenolol 50 MG tablet Commonly known as: TENORMIN Take 50 mg by mouth daily.   atorvastatin 20 MG tablet Commonly known as: LIPITOR Take 20 mg by mouth daily.   baclofen 10 MG tablet Commonly known as: LIORESAL Take 1 tablet (10 mg total) by mouth 2 (two) times daily. Take at 4 pm and at bedtime   Cholecalciferol 50 MCG (2000 UT) Tabs Take 1 tablet by mouth daily.   cyanocobalamin 1000 MCG tablet Take 1 tablet (1,000 mcg total) by mouth daily.   cyclobenzaprine 10 MG  tablet Commonly known as: FLEXERIL Take 1 tablet (10 mg total) by mouth 3 (three) times daily as needed for muscle spasms. What changed:  how much to take when to take this   diclofenac Sodium 1 % Gel Commonly known as: VOLTAREN Apply 2 g topically in the morning, at noon, and at bedtime.   HYDROcodone-acetaminophen 10-325 MG tablet--Rx# 28 pills.  Commonly known as: NORCO Take 1 tablet by mouth every 6 (six) hours as needed for severe pain ((score 8 to 10)). What changed:  how much to take when to take this reasons to take this   pantoprazole 40 MG tablet Commonly known as: PROTONIX Take 1 tablet (40 mg total) by mouth 2 (two) times daily. What changed:  medication strength how much to take when to take this   polyethylene glycol powder 17 GM/SCOOP powder Commonly known as: GLYCOLAX/MIRALAX Take 1 capful (17 g) by mouth daily.   Prolia 60 MG/ML Sosy injection Generic drug: denosumab Inject 60 mg into the skin every 6 (six) months.   senna-docusate 8.6-50 MG tablet Commonly known as: Senokot-S Take 2 tablets by mouth 2 (two) times daily.   tamsulosin 0.4 MG Caps capsule Commonly known as: FLOMAX Take 1 capsule (0.4 mg total) by mouth daily after supper.   zolpidem 10 MG tablet Commonly known as: AMBIEN Take 10 mg by mouth at bedtime.        Follow-up Information     Andreas Blower., MD Follow up.   Specialty: Internal Medicine Why: Call in 1-2 days for post hospital follow up Contact information: [address] [place] [address] [phone]         Julio Sicks, MD Follow up.   Specialty: Neurosurgery Why: Call in 1-2 days for post hospital follow up Contact information: [address] [place] [address] [phone]         Genice Rouge, MD Follow up.   Specialty:  Physical Medicine and Rehabilitation Why: office will call you with follow up appointment Contact information: [address] [place] [address] [phone]                 Signed: Jacquelynn Cree 11/09/2022, 11:40 PM

## 2022-11-05 NOTE — Progress Notes (Addendum)
Patient had 3 Bms 11/04/22 without bowel program. All continent Bms with the exception to one.  Had nurse verify that bowel program in binder and she did provide education if needed at home.

## 2022-11-05 NOTE — Progress Notes (Signed)
PROGRESS NOTE   Subjective/Complaints:  Had to take Norco last night due to muscle cramping/spasms- but controlled with Baclofen and Norco.   Jerking/spasms much better with regimen, but did have muscle cramping.  4 Bms yesterday without bowel program- had 1 accident. All formed per pt   ROS:   Pt denies SOB, abd pain, CP, N/V/C/D, and vision changes   Except for HPI Objective:   No results found. No results for input(s): "WBC", "HGB", "HCT", "PLT" in the last 72 hours.  No results for input(s): "NA", "K", "CL", "CO2", "GLUCOSE", "BUN", "CREATININE", "CALCIUM" in the last 72 hours.   Intake/Output Summary (Last 24 hours) at 11/05/2022 0844 Last data filed at 11/05/2022 8119 Gross per 24 hour  Intake 474 ml  Output 525 ml  Net -51 ml     Pressure Injury 10/29/22 Coccyx Mid Stage 2 -  Partial thickness loss of dermis presenting as a shallow open injury with a red, pink wound bed without slough. small area over coccyx (Active)  10/29/22 1524  Location: Coccyx  Location Orientation: Mid  Staging: Stage 2 -  Partial thickness loss of dermis presenting as a shallow open injury with a red, pink wound bed without slough.  Wound Description (Comments): small area over coccyx  Present on Admission: Yes    Physical Exam: Vital Signs Blood pressure (!) 143/74, pulse 78, temperature 97.9 F (36.6 C), temperature source Oral, resp. rate 16, height 5\' 9"  (1.753 m), weight 89.4 kg, SpO2 96 %.     General: awake, alert, appropriate, seen at EOB; appears increased Resp rate due to walking with OTA who's in room;  NAD HENT: conjugate gaze; oropharynx moist CV: regular rate and rhythm once breathing calmed; no JVD Pulmonary: CTA B/L; no W/R/R- good air movement GI: soft, NT, ND, (+)BS- protuberant Psychiatric: appropriate- interactive Neurological: Ox3  Maybe MAS of 1, no spasms seen this AM Extremities: No clubbing,  cyanosis, or edema. Pulses are 2+ Psych: Pt's affect is appropriate. Pt is cooperative   PRIOR EXAMS: Skin: Thoracic incision CDI, foam dressing on sacrum Neuro:  Alert and oriented x 3. Normal insight and awareness. Intact Memory. Normal language and speech. Cranial nerve exam unremarkable. MMT: BUE 5/5 except for left shoulder which is 4/5. BLE: HF 4-, KE 4, ADF/APF 4+ to 5/5. Decreased sense of LT and pain below the T12 level. DTR's 1+ in LE's. No abnl resting tone.   Musculoskeletal: Left shoulder limited with ABD/IR due to pain/RTC. Tenderness along thoracic spine/surgical site. Otherwise WNL    Assessment/Plan: 1. Functional deficits which require 3+ hours per day of interdisciplinary therapy in a comprehensive inpatient rehab setting. Physiatrist is providing close team supervision and 24 hour management of active medical problems listed below. Physiatrist and rehab team continue to assess barriers to discharge/monitor patient progress toward functional and medical goals  Care Tool:  Bathing    Body parts bathed by patient: Right arm, Left arm, Abdomen, Chest, Front perineal area, Buttocks, Right upper leg, Left upper leg, Right lower leg, Left lower leg, Face         Bathing assist Assist Level: Set up assist     Upper Body Dressing/Undressing  Upper body dressing   What is the patient wearing?: Pull over shirt    Upper body assist Assist Level: Independent    Lower Body Dressing/Undressing Lower body dressing      What is the patient wearing?: Pants, Underwear/pull up     Lower body assist Assist for lower body dressing: Supervision/Verbal cueing     Toileting Toileting    Toileting assist Assist for toileting: Contact Guard/Touching assist     Transfers Chair/bed transfer  Transfers assist     Chair/bed transfer assist level: Contact Guard/Touching assist     Locomotion Ambulation   Ambulation assist      Assist level: Minimal Assistance -  Patient > 75% Assistive device: Walker-rolling Max distance: 180   Walk 10 feet activity   Assist  Walk 10 feet activity did not occur: Safety/medical concerns (endurance after OT eval)  Assist level: Contact Guard/Touching assist Assistive device: Walker-rolling   Walk 50 feet activity   Assist Walk 50 feet with 2 turns activity did not occur: Safety/medical concerns  Assist level: Contact Guard/Touching assist Assistive device: Walker-rolling    Walk 150 feet activity   Assist Walk 150 feet activity did not occur: Safety/medical concerns  Assist level: Minimal Assistance - Patient > 75% Assistive device: Walker-rolling    Walk 10 feet on uneven surface  activity   Assist Walk 10 feet on uneven surfaces activity did not occur: Safety/medical concerns         Wheelchair     Assist Is the patient using a wheelchair?: Yes Type of Wheelchair: Manual    Wheelchair assist level: Supervision/Verbal cueing Max wheelchair distance: 150    Wheelchair 50 feet with 2 turns activity    Assist        Assist Level: Supervision/Verbal cueing   Wheelchair 150 feet activity     Assist      Assist Level: Supervision/Verbal cueing   Blood pressure (!) 143/74, pulse 78, temperature 97.9 F (36.6 C), temperature source Oral, resp. rate 16, height 5\' 9"  (1.753 m), weight 89.4 kg, SpO2 96 %.  Medical Problem List and Plan: 1. 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 Dutch Quint on 10/15/2022.  Patient with continued lower extremity numbness and weakness and decreased control bowel bladder.             -patient may shower             -ELOS/Goals:  7-10 days, Mod I PT/OT, no SLP             d/c 6/21  D/c tomorrow- discussed sexuality somewhat- not in depth- pt will work on figuring out if it's an issue at home and f/u with me in clinic to let me know  Con't CIR PT and OT 2.   Antithrombotics: -DVT/anticoagulation:  Mechanical: Sequential compression devices, below knee Bilateral lower extremities             -antiplatelet therapy: ASA 81mg  QD 3. Pain Management: Tylenol 500 mg/hs + PRN. Norco PRN.  --continue Flexeril and Hydrocodone prn.  --resume Voltaren gel to bilateral knees and left shoulder for chronic pain.  --LEFT knee injected a month ago at Dr. Nilsa Nutting office.--observe for gait/wb tolerance with staff/therapy  6/17- pain controlled, but jerking/spasticity is not 4. Mood/Behavior/Sleep:  LCSW to follow for evaluation and support.              -antipsychotic agents: N/A --continue Ambien prn for insomnia.--> 10/31/22  increased to 10mg  PRN since that's his home dose--improved! 6/17- sleeping much better- con't regimen 5. Neuropsych/cognition: This patient is capable of making decisions on his own behalf. 6. Skin/Wound Care: Monitor incision for healing.  7. Fluids/Electrolytes/Nutrition: encourage po, monitor routine labs.  8. Duodenitis/Evidence of PUD: Will increase protonix 40mg  to BID for now. .  9. HTN: Monitor BP TID--continue atenolol 50mg  daily and amlodipine 10mg  daily. -6/17-6/20- BP controlled- con't regimen Vitals:   11/01/22 1333 11/01/22 1938 11/02/22 0521 11/02/22 1512  BP: 113/68 116/76 122/71 111/71   11/02/22 2007 11/03/22 0549 11/03/22 1410 11/03/22 1935  BP: (!) 150/77 137/71 116/71 (!) 150/82   11/04/22 0425 11/04/22 1325 11/04/22 1911 11/05/22 0549  BP: 134/79 128/65 130/78 (!) 143/74    10. Suboptimal B 12 level @ 195: Continue supplement.  11. Neurogenic bowel/constipation:  Continue Miralax QD and Senna S 2 tabs BID-->am/noon             -- KUB with moderate stool burden  -Pt is fine with a daily suppository after supper. Can continue until he's has more consistent BM's. He had results last night and had another BM on his own this am. -11/01/22 LBM last night, cont regimen 6/17- 3 Bms yesterday- 1 at 2pm, and 2 with bowel  program/after- will con't bowel program for now- likely will be able to stop soon.  6/18- BM with bowel program- will hold next 2 days- if goes on his own, will stop- if needs it, will teach Thursday night before d/c.   6/19- no BM last night- if doesn't go by 6pm, will need bowel porgram- let charge nurse know- also needs education on bowel program  6/20- had 4 Bms yesterday without bowel program 12. Hyponatremia: Na has been ranging from 128 to 133 range since 05/31.              --changed diet to regular. (No hx of CAD documented)  -6/14 sodium 133--continue current plan  6/17- Na 137 this AM 13. Leukocytosis: Has been on prednisone/steroids for 4-5 weeks followed by decadron with last admission.               --no fevers, WBC now normal.   6/17- WBC 6.4 14. Abnormal LFTs: LFT's now WNL  15. Pre diabetes w/stress induced/steroid induced hyperglycemia: Hgb A1C 5.9. Monitor, no need for insulin or CBG checks for now.  16. Neurogenic bladder/BPH: Has been toileting himself but now with frequency/voiding small amounts.   -pt reports improved sense of bladder filling pressure  -continue timed toileting --Continue flomax 0.4mg  daily 6/18- going without retention  17. Spasticity  6/17- didn't get increased tone/clonus on exam, however spasticity has circadian rhythm to it and usually worse at night, when pt having issues- will start Baclofen 10 mg at bedtime for now and monitor Sx's.   6/18- worked great when given, but spasms worse til meds at 8pm- will change Baclofen to 4pm and 11pm and monitor  6/19- educated on spasticity- ways to help with ROM and techniques to calm down- also educated on progressive- up to 1+ years- and will need to call me in office to get meds if it progresses which is likely  6/20- having some muscle cramping, but jerking/spasms much better on current regimen- educated pt again that can progress over time.   I spent a total of 39   minutes on total care today- >50%  coordination of care- due to discussion with pt about spasticity as well as sexuality-  will d/c bowel program because going without program   LOS: 7 days A FACE TO FACE EVALUATION WAS PERFORMED  Netasha Wehrli 11/05/2022, 8:44 AM

## 2022-11-05 NOTE — Progress Notes (Signed)
Occupational Therapy Session Note  Patient Details  Name: Mitchell Morgan MRN: [id number] Date of Birth: May 21, 1945  Today's Date: 11/05/2022 OT Individual Time: 0700-0800 OT Individual Time Calculation (min): 60 min    Short Term Goals: Week 1:  OT Short Term Goal 1 (Week 1): STG = LTGS (d/t ELOS)  Skilled Therapeutic Interventions/Progress Updates:    PT sitting EOB upon arrival. Pt declined shower this morning, remarking that he always feel exhausted following showers. Reviewed energy conservation strategies/techniques. Pt amb with RW day room-7 mins NuStep level 4. Block sit<>stand without UE support-4x5. Pt returned to room and sat EOB. Sit>supine with supervision. Bed alarm activated. All needs wihtin reach.   Therapy Documentation Precautions:  Precautions Precautions: Fall, Back Precaution Comments: pt able to state 3/3; required no cues to adhere, Back precautions posted above bed Restrictions Weight Bearing Restrictions: No  Pain: Pt c/o BLE soreness from previous night's BLE spasms; MD aware  Therapy/Group: Individual Therapy  Rich Brave 11/05/2022, 10:05 AM

## 2022-11-05 NOTE — Plan of Care (Signed)
  Problem: RH Tub/Shower Transfers Goal: LTG Patient will perform tub/shower transfers w/assist (OT) Description: LTG: Patient will perform tub/shower transfers with assist, with/without cues using equipment (OT) Outcome: Completed/Met   

## 2022-11-06 NOTE — Progress Notes (Signed)
PROGRESS NOTE   Subjective/Complaints:  Pt had 2 small Bms yesterday- felt it was enough.   Suggested to keep taking Laxatives since taking Baclofen and does still has neurogenic bowel- slowed gut.   Asking more about intimacy.   Raw spot on R inner buttock, per pt- asking how to care for.  ROS:    Pt denies SOB, abd pain, CP, N/V/C/D, and vision changes   Except for HPI Objective:   No results found. No results for input(s): "WBC", "HGB", "HCT", "PLT" in the last 72 hours.  No results for input(s): "NA", "K", "CL", "CO2", "GLUCOSE", "BUN", "CREATININE", "CALCIUM" in the last 72 hours.   Intake/Output Summary (Last 24 hours) at 11/06/2022 0755 Last data filed at 11/06/2022 0754 Gross per 24 hour  Intake 954 ml  Output 300 ml  Net 654 ml     Pressure Injury 10/29/22 Coccyx Mid Stage 2 -  Partial thickness loss of dermis presenting as a shallow open injury with a red, pink wound bed without slough. small area over coccyx (Active)  10/29/22 1524  Location: Coccyx  Location Orientation: Mid  Staging: Stage 2 -  Partial thickness loss of dermis presenting as a shallow open injury with a red, pink wound bed without slough.  Wound Description (Comments): small area over coccyx  Present on Admission: Yes    Physical Exam: Vital Signs Blood pressure 125/72, pulse 73, temperature 97.7 F (36.5 C), resp. rate 18, height 5\' 9"  (1.753 m), weight 89.4 kg, SpO2 94 %.      General: awake, alert, appropriate, sitting EOB; finishing breakfast; NAD HENT: conjugate gaze; oropharynx moist CV: regular rate and rhythm; no JVD Pulmonary: CTA B/L; no W/R/R- good air movement GI: soft, NT, ND, (+)BS- protuberant Psychiatric: appropriate Neurological: Ox3 Skin- >50% healed stage II- skin barely open on R inner gluteal cleft- L side closed now- just slightly erythematous- ~ 1 -1.5 cm in diameter-  Maybe MAS of 1, no spasms seen  this AM Extremities: No clubbing, cyanosis, or edema. Pulses are 2+ Psych: Pt's affect is appropriate. Pt is cooperative   PRIOR EXAMS: Skin: Thoracic incision CDI, foam dressing on sacrum Neuro:  Alert and oriented x 3. Normal insight and awareness. Intact Memory. Normal language and speech. Cranial nerve exam unremarkable. MMT: BUE 5/5 except for left shoulder which is 4/5. BLE: HF 4-, KE 4, ADF/APF 4+ to 5/5. Decreased sense of LT and pain below the T12 level. DTR's 1+ in LE's. No abnl resting tone.   Musculoskeletal: Left shoulder limited with ABD/IR due to pain/RTC. Tenderness along thoracic spine/surgical site. Otherwise WNL    Assessment/Plan: 1. Functional deficits which require 3+ hours per day of interdisciplinary therapy in a comprehensive inpatient rehab setting. Physiatrist is providing close team supervision and 24 hour management of active medical problems listed below. Physiatrist and rehab team continue to assess barriers to discharge/monitor patient progress toward functional and medical goals  Care Tool:  Bathing    Body parts bathed by patient: Right arm, Left arm, Abdomen, Chest, Front perineal area, Buttocks, Right upper leg, Left upper leg, Right lower leg, Left lower leg, Face  Bathing assist Assist Level: Independent with assistive device     Upper Body Dressing/Undressing Upper body dressing   What is the patient wearing?: Pull over shirt    Upper body assist Assist Level: Independent    Lower Body Dressing/Undressing Lower body dressing      What is the patient wearing?: Pants, Underwear/pull up     Lower body assist Assist for lower body dressing: Independent with assitive device     Toileting Toileting    Toileting assist Assist for toileting: Independent with assistive device     Transfers Chair/bed transfer  Transfers assist     Chair/bed transfer assist level: Independent with assistive device Chair/bed transfer assistive  device: Arboriculturist assist      Assist level: Supervision/Verbal cueing Assistive device: Walker-rolling Max distance: 180   Walk 10 feet activity   Assist  Walk 10 feet activity did not occur: Safety/medical concerns (endurance after OT eval)  Assist level: Supervision/Verbal cueing Assistive device: Walker-rolling   Walk 50 feet activity   Assist Walk 50 feet with 2 turns activity did not occur: Safety/medical concerns  Assist level: Supervision/Verbal cueing Assistive device: Walker-rolling    Walk 150 feet activity   Assist Walk 150 feet activity did not occur: Safety/medical concerns  Assist level: Supervision/Verbal cueing Assistive device: Walker-rolling    Walk 10 feet on uneven surface  activity   Assist Walk 10 feet on uneven surfaces activity did not occur: Safety/medical concerns   Assist level: Supervision/Verbal cueing Assistive device: Development worker, international aid     Assist Is the patient using a wheelchair?: No Type of Wheelchair: Manual Wheelchair activity did not occur: N/A  Wheelchair assist level: Supervision/Verbal cueing Max wheelchair distance: 150    Wheelchair 50 feet with 2 turns activity    Assist    Wheelchair 50 feet with 2 turns activity did not occur: N/A   Assist Level: Supervision/Verbal cueing   Wheelchair 150 feet activity     Assist  Wheelchair 150 feet activity did not occur: N/A   Assist Level: Supervision/Verbal cueing   Blood pressure 125/72, pulse 73, temperature 97.7 F (36.5 C), resp. rate 18, height 5\' 9"  (1.753 m), weight 89.4 kg, SpO2 94 %.  Medical Problem List and Plan: 1. 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 Dutch Quint on 10/15/2022.  Patient with continued lower extremity numbness and weakness and decreased control bowel bladder.             -patient may shower              -ELOS/Goals:  7-10 days, Mod I PT/OT, no SLP             d/c today D/w pt needs f/u with surgeon as well as me- within 1 month for me  Discussed intimacy more in depth- about what Sx's could manifest- pt voiced comfort with info shared.   Also d/w pt stage II came to rehab with- on R inner gluteal cleft- needs large bandaid- no cream from what I could see.  2.  Antithrombotics: -DVT/anticoagulation:  Mechanical: Sequential compression devices, below knee Bilateral lower extremities             -antiplatelet therapy: ASA 81mg  QD 3. Pain Management: Tylenol 500 mg/hs + PRN. Norco PRN.  --continue Flexeril and Hydrocodone prn.  --resume Voltaren gel to bilateral knees and left shoulder for chronic pain.  --LEFT knee injected  a month ago at Dr. Nilsa Nutting office.--observe for gait/wb tolerance with staff/therapy  6/17- pain controlled, but jerking/spasticity is not 4. Mood/Behavior/Sleep:  LCSW to follow for evaluation and support.              -antipsychotic agents: N/A --continue Ambien prn for insomnia.--> 10/31/22 increased to 10mg  PRN since that's his home dose--improved! 6/17- sleeping much better- con't regimen 5. Neuropsych/cognition: This patient is capable of making decisions on his own behalf. 6. Skin/Wound Care: Monitor incision for healing.  7. Fluids/Electrolytes/Nutrition: encourage po, monitor routine labs.  8. Duodenitis/Evidence of PUD: Will increase protonix 40mg  to BID for now. .  9. HTN: Monitor BP TID--continue atenolol 50mg  daily and amlodipine 10mg  daily. -6/17-6/20- BP controlled- con't regimen  6/21- BP is borderline- will need to talk with PCP in [place] over time if needs titration.  Vitals:   11/02/22 1512 11/02/22 2007 11/03/22 0549 11/03/22 1410  BP: 111/71 (!) 150/77 137/71 116/71   11/03/22 1935 11/04/22 0425 11/04/22 1325 11/04/22 1911  BP: (!) 150/82 134/79 128/65 130/78   11/05/22 0549 11/05/22 1300 11/05/22 1919 11/06/22 0445  BP: (!) 143/74 136/70 125/78  125/72    10. Suboptimal B 12 level @ 195: Continue supplement.  11. Neurogenic bowel/constipation:  Continue Miralax QD and Senna S 2 tabs BID-->am/noon             -- KUB with moderate stool burden  -Pt is fine with a daily suppository after supper. Can continue until he's has more consistent BM's. He had results last night and had another BM on his own this am. -11/01/22 LBM last night, cont regimen 6/17- 3 Bms yesterday- 1 at 2pm, and 2 with bowel program/after- will con't bowel program for now- likely will be able to stop soon.  6/18- BM with bowel program- will hold next 2 days- if goes on his own, will stop- if needs it, will teach Thursday night before d/c.   6/19- no BM last night- if doesn't go by 6pm, will need bowel porgram- let charge nurse know- also needs education on bowel program  6/20- had 4 Bms yesterday without bowel program 6/21- going well without bowel program 12. Hyponatremia: Na has been ranging from 128 to 133 range since 05/31.              --changed diet to regular. (No hx of CAD documented)  -6/14 sodium 133--continue current plan  6/17- Na 137 this AM 13. Leukocytosis: Has been on prednisone/steroids for 4-5 weeks followed by decadron with last admission.               --no fevers, WBC now normal.   6/17- WBC 6.4 14. Abnormal LFTs: LFT's now WNL  15. Pre diabetes w/stress induced/steroid induced hyperglycemia: Hgb A1C 5.9. Monitor, no need for insulin or CBG checks for now.  16. Neurogenic bladder/BPH: Has been toileting himself but now with frequency/voiding small amounts.   -pt reports improved sense of bladder filling pressure  -continue timed toileting --Continue flomax 0.4mg  daily 6/18- going without retention  17. Spasticity  6/17- didn't get increased tone/clonus on exam, however spasticity has circadian rhythm to it and usually worse at night, when pt having issues- will start Baclofen 10 mg at bedtime for now and monitor Sx's.   6/18- worked great  when given, but spasms worse til meds at 8pm- will change Baclofen to 4pm and 11pm and monitor  6/19- educated on spasticity- ways to help with ROM and techniques to calm  down- also educated on progressive- up to 1+ years- and will need to call me in office to get meds if it progresses which is likely  6/20- having some muscle cramping, but jerking/spasms much better on current regimen- educated pt again that can progress over time.   6/21- will go home on baclofen 10 mg BID for now- call if needs more 18. Stage II that came with on R inner gluteal cleft-  6/21- will suggest to pt to use a big bandaid at home to cover- no ointment- is >50% healed since got to rehab.    I spent a total of 45   minutes on total care today- >50% coordination of care- due to d/w tp pt about intimacy- went more in depth- and need for f/u as spasticity can progress. - also assessed skin breakdown Stage II on R inner gluteal cleft-    LOS: 8 days A FACE TO FACE EVALUATION WAS PERFORMED  Mitchell Morgan 11/06/2022, 7:55 AM

## 2022-11-06 NOTE — Progress Notes (Signed)
Inpatient Rehabilitation Discharge Medication Review by a Pharmacist  A complete drug regimen review was completed for this patient to identify any potential clinically significant medication issues.  High Risk Drug Classes Is patient taking? Indication by Medication  Antipsychotic No   Anticoagulant No   Antibiotic No   Opioid Yes Vicodin - prn pain  Antiplatelet Yes ASA- antiplatelet therapy  Hypoglycemics/insulin No   Vasoactive Medication Yes Amlodipine, atenolol- HTN  Chemotherapy No   Other Yes Atorvastatin - HLD Acetaminophen,Diclofenac gel- pain management Baclofen- spasticity Flexeril prn spasms  Flomax- BPH/urinary retention Pantoprazole - Reflux Prolia(denosumab) inj- osteoporosis Zolpidem - insomnia     Type of Medication Issue Identified Description of Issue Recommendation(s)  Drug Interaction(s) (clinically significant)     Duplicate Therapy     Allergy     No Medication Administration End Date     Incorrect Dose     Additional Drug Therapy Needed     Significant med changes from prior encounter (inform family/care partners about these prior to discharge).    Other       Clinically significant medication issues were identified that warrant physician communication and completion of prescribed/recommended actions by midnight of the next day:  No  Pharmacist comments: None    Time spent performing this drug regimen review (minutes):  20   Noah Delaine, [place] Clinical Pharmacist 11/06/2022 10:52 AM

## 2022-11-06 NOTE — Progress Notes (Signed)
Inpatient Rehabilitation Care Coordinator Discharge Note   Patient Details  Name: Mitchell Morgan MRN: [id number] Date of Birth: 03-Mar-1946   Discharge location: D/c to home  Length of Stay: 7 days  Discharge activity level: Supervision  Home/community participation: Limited  Patient response WU:[id number] Literacy - How often do you need to have someone help you when you read instructions, pamphlets, or other written material from your doctor or pharmacy?: Never  Patient response YN:WGNFAO Isolation - How often do you feel lonely or isolated from those around you?: Never  Services provided included: MD, RD, PT, OT, RN, CM, TR, Pharmacy, Neuropsych, SW  Financial Services:  Field seismologist Utilized: Media planner [facility] and [employer]  Choices offered to/list presented to: pt and pt wife  Follow-up services arranged:  Home Health, DME Home Health Agency: [facility] for HHPT/OT/aide    D[facility]- 3in1 BSC and shower seat with back and arms    Patient response to transportation need: Is the patient able to respond to transportation needs?: Yes In the past 12 months, has lack of transportation kept you from medical appointments or from getting medications?: No In the past 12 months, has lack of transportation kept you from meetings, work, or from getting things needed for daily living?: No   Patient/Family verbalized understanding of follow-up arrangements:  Yes  Individual responsible for coordination of the follow-up plan: contact pt [phone] or pt wife Darel Hong [phone]  Confirmed correct DME delivered: Gretchen Short 11/06/2022    Comments (or additional information):fam edu completed  Summary of Stay    Date/Time Discharge Planning CSW  11/02/22 1333 Pt to return to his son's home here in [place]. Pt wife primary support. PRN support from son. All DME and outpatient therapies to be arranged by [facility]. DME- 3in1 BSC and TTB with armrests and  back; Outpatient- PT/OT. Orders and clinicals faxed. SW will confirm if any barriers to discharge. AAC       Pape Parson A Lula Olszewski

## 2022-11-09 DIAGNOSIS — N319 Neuromuscular dysfunction of bladder, unspecified: Secondary | ICD-10-CM | POA: Insufficient documentation

## 2022-11-09 DIAGNOSIS — K592 Neurogenic bowel, not elsewhere classified: Secondary | ICD-10-CM | POA: Insufficient documentation

## 2022-11-09 DIAGNOSIS — K2980 Duodenitis without bleeding: Secondary | ICD-10-CM | POA: Insufficient documentation

## 2022-11-09 DIAGNOSIS — R252 Cramp and spasm: Secondary | ICD-10-CM | POA: Insufficient documentation

## 2022-11-09 DIAGNOSIS — D62 Acute posthemorrhagic anemia: Secondary | ICD-10-CM | POA: Insufficient documentation

## 2022-11-09 DIAGNOSIS — I10 Essential (primary) hypertension: Secondary | ICD-10-CM | POA: Insufficient documentation

## 2022-12-07 ENCOUNTER — Encounter: Payer: Medicare HMO | Attending: Physical Medicine and Rehabilitation | Admitting: Physical Medicine and Rehabilitation

## 2022-12-07 ENCOUNTER — Encounter: Payer: Self-pay | Admitting: Physical Medicine and Rehabilitation

## 2022-12-07 VITALS — BP 129/82 | HR 82 | Wt 204.4 lb

## 2022-12-07 DIAGNOSIS — R29898 Other symptoms and signs involving the musculoskeletal system: Secondary | ICD-10-CM | POA: Insufficient documentation

## 2022-12-07 DIAGNOSIS — M549 Dorsalgia, unspecified: Secondary | ICD-10-CM

## 2022-12-07 DIAGNOSIS — N319 Neuromuscular dysfunction of bladder, unspecified: Secondary | ICD-10-CM | POA: Diagnosis not present

## 2022-12-07 DIAGNOSIS — G8222 Paraplegia, incomplete: Secondary | ICD-10-CM | POA: Insufficient documentation

## 2022-12-07 DIAGNOSIS — K592 Neurogenic bowel, not elsewhere classified: Secondary | ICD-10-CM

## 2022-12-07 DIAGNOSIS — M792 Neuralgia and neuritis, unspecified: Secondary | ICD-10-CM | POA: Insufficient documentation

## 2022-12-07 DIAGNOSIS — M5104 Intervertebral disc disorders with myelopathy, thoracic region: Secondary | ICD-10-CM | POA: Diagnosis present

## 2022-12-07 DIAGNOSIS — R252 Cramp and spasm: Secondary | ICD-10-CM | POA: Diagnosis present

## 2022-12-07 DIAGNOSIS — M5415 Radiculopathy, thoracolumbar region: Secondary | ICD-10-CM | POA: Diagnosis present

## 2022-12-07 DIAGNOSIS — M4805 Spinal stenosis, thoracolumbar region: Secondary | ICD-10-CM | POA: Diagnosis present

## 2022-12-07 DIAGNOSIS — G959 Disease of spinal cord, unspecified: Secondary | ICD-10-CM

## 2022-12-07 MED ORDER — DULOXETINE HCL 30 MG PO CPEP: 30.0000 mg | ORAL_CAPSULE | Freq: Every day | ORAL | 5 refills | Status: DC

## 2022-12-07 MED ORDER — HYDROCODONE-ACETAMINOPHEN 10-325 MG PO TABS: 1.0000 | ORAL_TABLET | Freq: Three times a day (TID) | ORAL | 0 refills | Status: DC | PRN

## 2022-12-07 MED ORDER — BACLOFEN 10 MG PO TABS: 10.0000 mg | ORAL_TABLET | Freq: Three times a day (TID) | ORAL | 5 refills | Status: DC

## 2022-12-07 NOTE — Patient Instructions (Signed)
Pt is a 77 yr old male with hx of T10 myelopathy due to thoracic stenosis- s/p T10/11 lami 10/15/22. 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  With At level SCI pain.   Spasticity- Will increase Baclofen to 10 mg at 4pm and 20 mg nightly at bedtime-  the max dose is 160 mg/day. 4 hours between doses.   2.  For back pain- Hydrocodone 5/325 mg up to 3x/day-  #90- take up to 3x/day as needed for severe pain.   3.  Discussed gabapentin and Duloxetine - will try 30 mg at bedtime- x 1 week, then 60 mg nightly- for at level SCI pain. For nerve pain.    4. Can use pillow between or under knees-   5. Was told needs to address neck surgery. " Spine is a mess".    6. Went over prognosis and diagnosis with pt- takes 12 months to get things back.  I would love you to make more gains.    7. Needs hip and knees stronger- getting off commode, knee bends; etc- to strengthen is the best choice. Do sme every day to strengthen.  5-10 rep a couple times per day.   8. F/U in 3months double appt.    9. SCI support group- last Thursday of the month 6-7 pm- first floor conference- [facility] [address]

## 2022-12-07 NOTE — Progress Notes (Signed)
Subjective:    Patient ID: Mitchell Morgan, male    DOB: Oct 02, 1945, 77 y.o.   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    Was hoping would be better, faster.   Walking with RW- can get out to restaurant- but not grocery store- - pushing  to do what he can.   Tomorrow is last H/H therapy.   Doesn't think still needs OT-  Showering going well- grab bars and mats that don't move-  dressing fine- so doesn't think needs OT- just PT.    Still having pain in back- getting MRI this Thursday- by Dr Jordan Likes-    Pain- constant pain in midline thoracic back-  More aching pain than anything.  Gets worse with movement.   Doesn't use Norco during  day- only at night.  Hasn't taken much of Flexeril- was just given.    Spasms- still having leg spasms every night- starts as soon as gets in bed. Til 2-3am-  Takes baclofen- hasn't been taking one at 4pm- but taking bedtime medicine.  Keeps on jumping- whole bed is jumping.   Near falls- knees collapsed- no actual falls.    At level SCI pain- a band of tightness- like a rope squeezing him.    Pain Inventory Average Pain 6 Pain Right Now 6 My pain is sharp, dull, tingling, and aching  In the last 24 hours, has pain interfered with the following? General activity 10 Relation with others 10 Enjoyment of life 10 What TIME of day is your pain at its worst? morning , evening, and night Sleep (in general) Poor  Pain is worse with: walking, sitting, and standing Pain improves with: rest and medication Relief from Meds: 4  walk without assistance walk with assistance use a walker how many minutes can you walk? 10 ability to climb steps?   no do you drive?  no Do you have any goals in this area?  yes  employed # of hrs/week sales retired  numbness tingling trouble walking spasms depression anxiety  Any changes since last visit?  no  Any changes since last visit?  no    Family History  Problem Relation Age of Onset   Hypertension Mother    Kidney failure Mother    Heart disease Father    Social History   Socioeconomic History   Marital status: Married    Spouse name: Not on file   Number of children: Not on file   Years of education: Not on file   Highest education level: Not on file  Occupational History   Not on file  Tobacco Use   Smoking status: Never   Smokeless tobacco: Never  Substance and Sexual Activity   Alcohol use: No   Drug use: Never   Sexual activity: Not on file  Other Topics Concern   Not on file  Social History Narrative   Not on file   Social Determinants of Health   Financial Resource Strain: Low Risk  (09/09/2022)   [facility], [facility]   Overall Financial Resource Strain (CARDIA)    Difficulty of Paying Living Expenses: Not hard at all  Food Insecurity: No  Food Insecurity (10/26/2022)   Hunger Vital Sign    Worried About Running Out of Food in the Last Year: Never true    Ran Out of Food in the Last Year: Never true  Transportation Needs: No Transportation Needs (10/26/2022)   PRAPARE - Administrator, Civil Service (Medical): No    Lack of Transportation (Non-Medical): No  Physical Activity: Not on file  Stress: Not on file  Social Connections: Unknown (09/30/2021)   Received from [facility], [facility]   Social Network    Social Network: Not on file   Past Surgical History:  Procedure Laterality Date   DECOMPRESSIVE LUMBAR LAMINECTOMY LEVEL 1 N/A 10/15/2022   Procedure: DECOMPRESSIVE thoracic laminectomy THORACIC TEN -ELEVEN;  Surgeon: Julio Sicks, MD;  Location: [facility] OR;  Service: Neurosurgery;  Laterality: N/A;   Past  Medical History:  Diagnosis Date   Basal cell carcinoma    left cheek   BPH (benign prostatic hyperplasia)    Diverticulosis    GERD (gastroesophageal reflux disease)    High cholesterol    Hip pain, bilateral    left > right   Hypertension    Insomnia    Knee pain, bilateral    Prediabetes    Rotator cuff disorder, left    BP 129/82   Pulse 82   Wt 204 lb 6.4 oz (92.7 kg)   SpO2 95%   BMI 30.18 kg/m   Opioid Risk Score:   Fall Risk Score:  `1  Depression screen [facility] 2/9     12/07/2022    1:02 PM  Depression screen PHQ 2/9  Decreased Interest 3  Down, Depressed, Hopeless 1  PHQ - 2 Score 4  Altered sleeping 0  Tired, decreased energy 3  Change in appetite 1  Feeling bad or failure about yourself  2  Trouble concentrating 3  Moving slowly or fidgety/restless 1  Suicidal thoughts 0  PHQ-9 Score 14    Review of Systems  Constitutional: Negative.   HENT: Negative.    Eyes: Negative.   Respiratory: Negative.    Cardiovascular:  Positive for leg swelling.  Gastrointestinal:  Positive for abdominal pain and constipation.  Endocrine: Negative.   Genitourinary: Negative.   Musculoskeletal:  Positive for back pain, gait problem and neck pain.       Spasms  Skin: Negative.   Allergic/Immunologic: Negative.   Neurological:  Positive for numbness.       Tingling  Hematological: Negative.   Psychiatric/Behavioral: Negative.    All other systems reviewed and are negative.      Objective:   Physical Exam Awake, alert, appropriate, using RW,  accompanied by wife, NAD MS:  RLE_ HF 4/5;  KE 5-/5; DF and PF 5-5 LLE- HF 3-/5; KE 4/5; and DF/PF 4+/5  Crossed DTRs  at patella B/L No clonus but MAS of 1-has a catch B/L   More sad mood.      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  With At level SCI pain.   Spasticity- Will increase Baclofen to 10 mg at 4pm and 20 mg nightly at bedtime-  the max dose is 160 mg/day. 4  hours between doses.   2.  For back pain- Hydrocodone 5/325 mg up to 3x/day-  #90- take up to 3x/day as needed for severe pain.   3.  Discussed gabapentin and Duloxetine - will try 30 mg at bedtime- x 1 week, then 60 mg nightly- for at level SCI pain. For nerve pain.    4. Can use pillow between or under knees-   5. Was told needs to address neck surgery. " Spine is a mess".    6. Went over prognosis and diagnosis with pt- takes 12 months to get things back.  I would love you to make more gains.    7. Needs hip and knees stronger- getting off commode, knee bends; etc- to strengthen is the best choice. Do sme every day to strengthen.  5-10 rep a couple times per day.   8. F/U in 3months double appt.    9. SCI support group- last Thursday of the month 6-7 pm- first floor conference- [facility] [address]     I spent a total of  43  minutes on total care today- >50% coordination of care- due to  D/w tp about SCI at level pain; nerve pain; back pain; weakness spasticity and will hold off Sextil next appt.

## 2022-12-25 NOTE — Therapy (Unsigned)
OUTPATIENT PHYSICAL THERAPY THORACOLUMBAR EVALUATION   Patient Name: Mitchell Morgan MRN: [id number] DOB:[birth year], 77 y.o., male Today's Date: 12/25/2022  END OF SESSION:   Past Medical History:  Diagnosis Date   Basal cell carcinoma    left cheek   BPH (benign prostatic hyperplasia)    Diverticulosis    GERD (gastroesophageal reflux disease)    High cholesterol    Hip pain, bilateral    left > right   Hypertension    Insomnia    Knee pain, bilateral    Prediabetes    Rotator cuff disorder, left    Past Surgical History:  Procedure Laterality Date   DECOMPRESSIVE LUMBAR LAMINECTOMY LEVEL 1 N/A 10/15/2022   Procedure: DECOMPRESSIVE thoracic laminectomy THORACIC TEN -ELEVEN;  Surgeon: Julio Sicks, MD;  Location: [facility] OR;  Service: Neurosurgery;  Laterality: N/A;   Patient Active Problem List   Diagnosis Date Noted   Nerve pain 12/07/2022   Incomplete paraplegia (HCC) 12/07/2022   Thoracic disc disorder with myelopathy 12/07/2022   Duodenitis without bleeding 11/09/2022   Neurogenic bladder 11/09/2022   Essential hypertension 11/09/2022   Neurogenic bowel 11/09/2022   Acute blood loss anemia 11/09/2022   Spasticity 11/09/2022   Thoracic myelopathy 10/25/2022   Bilateral leg weakness 10/14/2022   HTN (hypertension) 10/14/2022   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  ONSET DATE: 12/07/22  SUBJECTIVE:                                                                                                                                                                                            SUBJECTIVE STATEMENT: ***  PERTINENT HISTORY:  Patient with recent T10-11 decompressive laminectomy by Dr Dutch Quint, also had low B 12. D/C'd home, but returned due to Duodenitis. Also found to have decreased sensation and strength in legs with balance issues, and B & B challenges. 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  With At level SCI pain   PAIN:  Are you having pain? {OPRCPAIN:27236}  PRECAUTIONS: Fall  RED FLAGS: {PT Red Flags:29287}   WEIGHT BEARING RESTRICTIONS: {Yes ***/No:24003}  FALLS:  Has patient fallen in last 6 months? {fallsyesno:27318}  LIVING ENVIRONMENT: Lives with: {OPRC lives with:25569::"lives with their family"} Lives in: {Lives in:25570} Stairs: {opstairs:27293} Has following equipment at home: {Assistive devices:23999}  OCCUPATION: ***  PLOF: {PLOF:24004}  PATIENT GOALS: ***  NEXT MD VISIT: ***  OBJECTIVE:   DIAGNOSTIC FINDINGS:  MRI 10/25/22 CERVICAL SPINE:   1. C4-C5 severe spinal canal stenosis with moderate right and severe left neural foraminal narrowing. 2. C3-C4 moderate to severe spinal canal stenosis and severe bilateral neural foraminal narrowing. 3. C2-C3 mild spinal canal stenosis and moderate to severe bilateral neural foraminal narrowing. 4. C1-C2 mild-to-moderate spinal canal stenosis, secondary to pannus formation posterior to the dens and  ligamentum flavum hypertrophy.   THORACIC SPINE:   1. T10-T11 moderate spinal canal stenosis with moderate to severe left and moderate right neural foraminal narrowing. 2. T11-T12 moderate spinal canal stenosis and mild bilateral neural foraminal narrowing.   LUMBAR SPINE:   1. L2-L3 and L3-L4 moderate spinal canal stenosis and mild left neural foraminal narrowing. 2. L1-L2 mild spinal canal stenosis and mild-to-moderate right neural foraminal narrowing. 3. L5-S1 moderate bilateral neural foraminal narrowing. In addition a right subarticular disc protrusion and annular fissure likely contacts the descending right S1 nerve roots. 4. Narrowing of the lateral recesses throughout the lumbar spine, which can affect the descending nerve roots at each level.  PATIENT SURVEYS:  {rehab surveys:24030}  SCREENING FOR RED FLAGS: Bowel or bladder incontinence: {Yes/No:304960894} Spinal tumors: {Yes/No:304960894} Cauda equina syndrome: {Yes/No:304960894} Compression fracture: {Yes/No:304960894} Abdominal aneurysm: {Yes/No:304960894}  COGNITION: Overall cognitive status: {cognition:24006}     SENSATION: {sensation:27233}  MUSCLE LENGTH: Hamstrings: Right *** deg; Left *** deg Thomas test: Right *** deg; Left *** deg  POSTURE: {posture:25561}  PALPATION: ***  LUMBAR ROM:   AROM eval  Flexion   Extension   Right lateral flexion   Left lateral flexion   Right rotation   Left rotation    (Blank rows = not tested)  LOWER EXTREMITY ROM:     {AROM/PROM:27142}  Right eval Left eval  Hip flexion    Hip extension    Hip abduction    Hip adduction    Hip internal rotation    Hip external rotation    Knee flexion    Knee extension    Ankle dorsiflexion    Ankle plantarflexion    Ankle inversion    Ankle eversion     (Blank rows = not tested)  LOWER EXTREMITY MMT:    MMT Right eval Left eval  Hip flexion    Hip extension    Hip abduction    Hip adduction     Hip internal rotation    Hip external rotation    Knee flexion    Knee extension    Ankle dorsiflexion    Ankle plantarflexion    Ankle inversion    Ankle eversion     (Blank rows = not tested)  LUMBAR SPECIAL TESTS:  {lumbar special test:25242}  FUNCTIONAL TESTS:  {Functional tests:24029}  GAIT: Distance walked: *** Assistive device utilized: {Assistive devices:23999} Level  of assistance: {Levels of assistance:24026} Comments: ***  TODAY'S TREATMENT:                                                                                                                              DATE:  12/28/22 Education    PATIENT EDUCATION:  Education details: POC Person educated: Patient Education method: Explanation Education comprehension: verbalized understanding  HOME EXERCISE PROGRAM: ***  ASSESSMENT:  CLINICAL IMPRESSION: Patient is a 76 y.o. who was seen today for physical therapy evaluation and treatment for ***.   OBJECTIVE IMPAIRMENTS: Abnormal gait, decreased activity tolerance, decreased balance, decreased coordination, decreased mobility, difficulty walking, decreased ROM, decreased strength, postural dysfunction, obesity, and pain.   ACTIVITY LIMITATIONS: carrying, lifting, bending, stairs, transfers, reach over head, and locomotion level  PARTICIPATION LIMITATIONS: meal prep, cleaning, laundry, driving, and shopping  PERSONAL FACTORS: Past/current experiences are also affecting patient's functional outcome.   REHAB POTENTIAL: Good  CLINICAL DECISION MAKING: Stable/uncomplicated  EVALUATION COMPLEXITY: Low   GOALS: Goals reviewed with patient? {yes/no:20286}  SHORT TERM GOALS: Target date: ***  *** Baseline: Goal status: INITIAL  2.  *** Baseline:  Goal status: INITIAL  3.  *** Baseline:  Goal status: INITIAL  4.  *** Baseline:  Goal status: INITIAL  5.  *** Baseline:  Goal status: INITIAL  6.  *** Baseline:  Goal status: INITIAL  LONG  TERM GOALS: Target date: ***  *** Baseline:  Goal status: INITIAL  2.  *** Baseline:  Goal status: INITIAL  3.  *** Baseline:  Goal status: INITIAL  4.  *** Baseline:  Goal status: INITIAL  5.  *** Baseline:  Goal status: INITIAL  6.  *** Baseline:  Goal status: INITIAL  PLAN:  PT FREQUENCY: {rehab frequency:25116}  PT DURATION: {rehab duration:25117}  PLANNED INTERVENTIONS: {rehab planned interventions:25118::"Therapeutic exercises","Therapeutic activity","Neuromuscular re-education","Balance training","Gait training","Patient/Family education","Self Care","Joint mobilization"}.  PLAN FOR NEXT SESSION: ***   Iona Beard, DPT 12/25/2022, 7:48 AM

## 2022-12-28 ENCOUNTER — Ambulatory Visit: Payer: Medicare HMO | Attending: Physical Medicine and Rehabilitation | Admitting: Physical Therapy

## 2022-12-28 ENCOUNTER — Encounter: Payer: Self-pay | Admitting: Physical Therapy

## 2022-12-28 DIAGNOSIS — M5104 Intervertebral disc disorders with myelopathy, thoracic region: Secondary | ICD-10-CM | POA: Insufficient documentation

## 2022-12-28 DIAGNOSIS — R2681 Unsteadiness on feet: Secondary | ICD-10-CM | POA: Diagnosis present

## 2022-12-28 DIAGNOSIS — M4714 Other spondylosis with myelopathy, thoracic region: Secondary | ICD-10-CM | POA: Insufficient documentation

## 2022-12-28 DIAGNOSIS — R262 Difficulty in walking, not elsewhere classified: Secondary | ICD-10-CM | POA: Insufficient documentation

## 2022-12-28 DIAGNOSIS — M6281 Muscle weakness (generalized): Secondary | ICD-10-CM | POA: Insufficient documentation

## 2022-12-28 DIAGNOSIS — G8222 Paraplegia, incomplete: Secondary | ICD-10-CM | POA: Insufficient documentation

## 2023-01-07 ENCOUNTER — Encounter (HOSPITAL_COMMUNITY): Payer: Self-pay

## 2023-01-07 ENCOUNTER — Emergency Department (HOSPITAL_COMMUNITY)
Admission: EM | Admit: 2023-01-07 | Discharge: 2023-01-07 | Disposition: A | Discharge status: Home / Self Care | Payer: No Typology Code available for payment source | Attending: Emergency Medicine | Admitting: Emergency Medicine

## 2023-01-07 ENCOUNTER — Other Ambulatory Visit: Payer: Self-pay

## 2023-01-07 DIAGNOSIS — Z7982 Long term (current) use of aspirin: Secondary | ICD-10-CM | POA: Insufficient documentation

## 2023-01-07 DIAGNOSIS — K625 Hemorrhage of anus and rectum: Secondary | ICD-10-CM | POA: Diagnosis present

## 2023-01-07 DIAGNOSIS — R1010 Upper abdominal pain, unspecified: Secondary | ICD-10-CM | POA: Insufficient documentation

## 2023-01-07 LAB — COMPREHENSIVE METABOLIC PANEL
ALT: 17 U/L (ref 0–44)
AST: 20 U/L (ref 15–41)
Albumin: 4 g/dL (ref 3.5–5.0)
Alkaline Phosphatase: 53 U/L (ref 38–126)
Anion gap: 10 (ref 5–15)
BUN: 14 mg/dL (ref 8–23)
CO2: 19 mmol/L — ABNORMAL LOW (ref 22–32)
Calcium: 8.8 mg/dL — ABNORMAL LOW (ref 8.9–10.3)
Chloride: 99 mmol/L (ref 98–111)
Creatinine, Ser: 0.89 mg/dL (ref 0.61–1.24)
GFR, Estimated: >60 mL/min (ref >60)
Glucose, Bld: 100 mg/dL — ABNORMAL HIGH (ref 70–99)
Potassium: 3.7 mmol/L (ref 3.5–5.1)
Sodium: 128 mmol/L — ABNORMAL LOW (ref 135–145)
Total Bilirubin: 1.3 mg/dL — ABNORMAL HIGH (ref 0.3–1.2)
Total Protein: 7.2 g/dL (ref 6.5–8.1)

## 2023-01-07 LAB — CBC
HCT: 38.7 % — ABNORMAL LOW (ref 39.0–52.0)
Hemoglobin: 13 g/dL (ref 13.0–17.0)
MCH: 31.7 pg (ref 26.0–34.0)
MCHC: 33.6 g/dL (ref 30.0–36.0)
MCV: 94.4 fL (ref 80.0–100.0)
Platelets: 247 10*3/uL (ref 150–400)
RBC: 4.1 MIL/uL — ABNORMAL LOW (ref 4.22–5.81)
RDW: 12.2 % (ref 11.5–15.5)
WBC: 9 10*3/uL (ref 4.0–10.5)
nRBC: 0 % (ref 0.0–0.2)

## 2023-01-07 LAB — POC OCCULT BLOOD, ED: Fecal Occult Bld: POSITIVE — AB

## 2023-01-07 LAB — TYPE AND SCREEN
ABO/RH(D): A NEG
Antibody Screen: NEGATIVE

## 2023-01-07 NOTE — Discharge Instructions (Signed)
1.  Do not take your aspirin for several days. 2.  Call your doctor in the morning and have them schedule a recheck of your blood counts tomorrow afternoon. 3.  At this time your blood counts are good and your vital signs are good.  If your counts stay stable, you should get scheduled with a gastroenterologist for your upper and lower endoscopies. 4.  If your blood counts are decreasing and there is any evidence you are having brisk bleeding, you must return to the emergency department.  Return if you feel lightheaded, your blood pressure gets low, you see very black or frankly bloody looking stool.

## 2023-01-07 NOTE — ED Triage Notes (Addendum)
Patient has had dark black rectal bleeding since yesterday x2. Denies abdominal pain. Not on a blood thinner.

## 2023-01-07 NOTE — ED Provider Notes (Signed)
[place] EMERGENCY DEPARTMENT AT [facility] Provider Note   CSN: [id number] Arrival date & time: 01/07/23  1149     History  Chief Complaint  Patient presents with   Rectal Bleeding    Mitchell Morgan is a 77 y.o. male.  HPI Patient does not take any anticoagulants.  He does take a daily aspirin.  He does not take any ibuprofen or NSAIDs.  He reports that he noted dark stool yesterday and had an appointment with his doctor today.  He reports his doctor did a stool card in the office and was concern for GI bleeding and referred to the patient the emergency department for further evaluation.  Patient denies abdominal pain.  He does note with further questioning however about 3 days ago he had some upper abdominal upset and took some Mylanta.  He used to be on Protonix but was discontinued about a month ago.  He is not having any problems with regular ongoing upper abdominal pain.  He has had no vomiting.  He has been eating without pain.  No lightheadedness no dizziness.  Patient reports baseline gait dysfunction since spinal surgery.  He uses a walking roller or rollator.  He has problems with spastic nerve pain in movements of his lower legs sometimes.    Home Medications Prior to Admission medications   Medication Sig Start Date End Date Taking? Authorizing Provider  acetaminophen (TYLENOL) 325 MG tablet Take 1-2 tablets (325-650 mg total) by mouth every 4 (four) hours as needed for mild pain. 11/02/22   Love, Evlyn Kanner, PA-C  amLODipine (NORVASC) 10 MG tablet Take 10 mg by mouth daily.    [provider]  aspirin EC (ASPIRIN 81) 81 MG tablet Take 81 mg by mouth daily.    [provider]  atenolol (TENORMIN) 50 MG tablet Take 50 mg by mouth daily. 06/07/15   [provider]  atorvastatin (LIPITOR) 20 MG tablet Take 20 mg by mouth daily. 06/07/15   [provider]  baclofen (LIORESAL) 10 MG tablet Take 1 tablet (10 mg total) by mouth 2  (two) times daily. Take at 4 pm and at bedtime 11/05/22   Love, Evlyn Kanner, PA-C  baclofen (LIORESAL) 10 MG tablet Take 1 tablet (10 mg total) by mouth 3 (three) times daily. 10 mg at 4pm and 20 mg at bedtime/10 pm or so- for spasticity 12/07/22   Lovorn, Aundra Millet, MD  Cholecalciferol 50 MCG (2000 UT) TABS Take 1 tablet by mouth daily. 03/31/17   [provider]  cyanocobalamin 1000 MCG tablet Take 1 tablet (1,000 mcg total) by mouth daily. 11/05/22   Love, Evlyn Kanner, PA-C  cyclobenzaprine (FLEXERIL) 10 MG tablet Take 1 tablet (10 mg total) by mouth 3 (three) times daily as needed for muscle spasms. 11/05/22   Love, Evlyn Kanner, PA-C  diclofenac Sodium (VOLTAREN) 1 % GEL Apply 2 g topically in the morning, at noon, and at bedtime. 11/05/22   Love, Evlyn Kanner, PA-C  DULoxetine (CYMBALTA) 30 MG capsule Take 1 capsule (30 mg total) by mouth at bedtime. X 1 week, then 60 mg nightly- for nerve at level SCI pain 12/07/22   Lovorn, Aundra Millet, MD  HYDROcodone-acetaminophen (NORCO) 10-325 MG tablet Take 1 tablet by mouth 3 (three) times daily as needed for severe pain ((score 8 to 10)). For back pain- 12/07/22   Lovorn, Aundra Millet, MD  pantoprazole (PROTONIX) 40 MG tablet Take 1 tablet (40 mg total) by mouth 2 (two) times daily. 11/05/22  Love, Pamela S, PA-C  polyethylene glycol powder (GLYCOLAX/MIRALAX) 17 GM/SCOOP powder Take 1 capful (17 g) by mouth daily. 11/05/22   Love, Evlyn Kanner, PA-C  PROLIA 60 MG/ML SOSY injection Inject 60 mg into the skin every 6 (six) months. 04/06/22   [provider]  senna-docusate (SENOKOT-S) 8.6-50 MG tablet Take 2 tablets by mouth 2 (two) times daily. 11/05/22   Love, Evlyn Kanner, PA-C  tamsulosin (FLOMAX) 0.4 MG CAPS capsule Take 1 capsule (0.4 mg total) by mouth daily after supper. 11/05/22   Love, Evlyn Kanner, PA-C  zolpidem (AMBIEN) 10 MG tablet Take 10 mg by mouth at bedtime. 06/07/15   [provider]      Allergies    Iodine, Pravastatin, Shrimp [shellfish allergy], and  Simvastatin    Review of Systems   Review of Systems  Physical Exam Updated Vital Signs BP 130/75   Pulse 68   Temp 98 F (36.7 C) (Oral)   Resp 16   Ht 5' 9.5" (1.765 m)   Wt 89.8 kg   SpO2 100%   BMI 28.82 kg/m  Physical Exam Constitutional:      Comments: Nontoxic well in appearance.  HENT:     Mouth/Throat:     Pharynx: Oropharynx is clear.  Eyes:     Extraocular Movements: Extraocular movements intact.  Cardiovascular:     Rate and Rhythm: Normal rate and regular rhythm.  Pulmonary:     Effort: Pulmonary effort is normal.     Breath sounds: Normal breath sounds.  Abdominal:     General: There is no distension.     Palpations: Abdomen is soft.     Tenderness: There is no abdominal tenderness. There is no guarding.  Genitourinary:    Comments: Soft stool in the vault.  Green/dark brown appearance.  No melena.  No visible blood.  Prostate enlarged and nontender Musculoskeletal:        General: No swelling or tenderness. Normal range of motion.     Right lower leg: No edema.     Left lower leg: No edema.  Skin:    General: Skin is warm and dry.  Neurological:     General: No focal deficit present.     Mental Status: He is oriented to person, place, and time.  Psychiatric:        Mood and Affect: Mood normal.     ED Results / Procedures / Treatments   Labs (all labs ordered are listed, but only abnormal results are displayed) Labs Reviewed  COMPREHENSIVE METABOLIC PANEL - Abnormal; Notable for the following components:      Result Value   Sodium 128 (*)    CO2 19 (*)    Glucose, Bld 100 (*)    Calcium 8.8 (*)    Total Bilirubin 1.3 (*)    All other components within normal limits  CBC - Abnormal; Notable for the following components:   RBC 4.10 (*)    HCT 38.7 (*)    All other components within normal limits  POC OCCULT BLOOD, ED - Abnormal; Notable for the following components:   Fecal Occult Bld POSITIVE (*)    All other components within normal  limits  TYPE AND SCREEN    EKG None  Radiology No results found.  Procedures Procedures    Medications Ordered in ED Medications - No data to display  ED Course/ Medical Decision Making/ A&P  Medical Decision Making Amount and/or Complexity of Data Reviewed Labs: ordered.   Patient having noted dark black stool.  Referred from PCP office for evaluation of GI bleed.  Patient is not anticoagulated.  He does take a daily baby aspirin.  No use of NSAIDs.  Clinically patient is well in appearance.  Will proceed with diagnostic evaluation.  Vital signs are stable.  White count 9.0 hemoglobin 13.0 hematocrit 38.7 platelets 247.  Sodium 128.  GFR normal at greater than 60.  Occult stool card positive.  At this time, patient clinically stable and asymptomatic.  He has no abdominal pain, there is been no hematemesis, vital signs and H&H are normal.  Currently no indication of brisk blood loss.  Feel patient is stable for continued outpatient management and scheduling for endoscopies.  Patient has been advised to initiate PPI and temporarily discontinue aspirin.  We extensively reviewed careful return precautions.  He and family members voiced understanding and agree with this plan.         Final Clinical Impression(s) / ED Diagnoses Final diagnoses:  Rectal bleeding    Rx / DC Orders ED Discharge Orders     None         Arby Barrette, MD 01/14/23 1550

## 2023-01-12 ENCOUNTER — Ambulatory Visit: Payer: Medicare HMO | Admitting: Physical Therapy

## 2023-01-14 ENCOUNTER — Ambulatory Visit: Payer: Medicare HMO | Admitting: Physical Therapy

## 2023-01-19 ENCOUNTER — Ambulatory Visit: Payer: Medicare HMO | Admitting: Physical Therapy

## 2023-01-21 ENCOUNTER — Ambulatory Visit: Payer: Medicare HMO | Admitting: Physical Therapy

## 2023-01-25 ENCOUNTER — Ambulatory Visit: Payer: Medicare HMO | Attending: Physical Medicine and Rehabilitation | Admitting: Physical Therapy

## 2023-01-25 ENCOUNTER — Telehealth: Payer: Self-pay

## 2023-01-25 ENCOUNTER — Encounter: Payer: Self-pay | Admitting: Physical Therapy

## 2023-01-25 DIAGNOSIS — R262 Difficulty in walking, not elsewhere classified: Secondary | ICD-10-CM | POA: Diagnosis present

## 2023-01-25 DIAGNOSIS — M4714 Other spondylosis with myelopathy, thoracic region: Secondary | ICD-10-CM | POA: Insufficient documentation

## 2023-01-25 DIAGNOSIS — M6281 Muscle weakness (generalized): Secondary | ICD-10-CM | POA: Insufficient documentation

## 2023-01-25 DIAGNOSIS — R2681 Unsteadiness on feet: Secondary | ICD-10-CM | POA: Insufficient documentation

## 2023-01-25 NOTE — Therapy (Signed)
OUTPATIENT PHYSICAL THERAPY THORACOLUMBAR TREATMENT   Patient Name: Mitchell Morgan MRN: [id number] DOB:[birth year], 77 y.o., male Today's Date: 01/25/2023  END OF SESSION:  PT End of Session - 01/25/23 1446     Visit Number 2    Date for PT Re-Evaluation 03/08/23    PT Start Time 1432    PT Stop Time 1512    PT Time Calculation (min) 40 min    Activity Tolerance Patient tolerated treatment well    Behavior During Therapy WFL for tasks assessed/performed              Past Medical History:  Diagnosis Date   Basal cell carcinoma    left cheek   BPH (benign prostatic hyperplasia)    Diverticulosis    GERD (gastroesophageal reflux disease)    High cholesterol    Hip pain, bilateral    left > right   Hypertension    Insomnia    Knee pain, bilateral    Prediabetes    Rotator cuff disorder, left    Past Surgical History:  Procedure Laterality Date   DECOMPRESSIVE LUMBAR LAMINECTOMY LEVEL 1 N/A 10/15/2022   Procedure: DECOMPRESSIVE thoracic laminectomy THORACIC TEN -ELEVEN;  Surgeon: Julio Sicks, MD;  Location: [facility] OR;  Service: Neurosurgery;  Laterality: N/A;   Patient Active Problem List   Diagnosis Date Noted   Nerve pain 12/07/2022   Incomplete paraplegia (HCC) 12/07/2022   Thoracic disc disorder with myelopathy 12/07/2022   Duodenitis without bleeding 11/09/2022   Neurogenic bladder 11/09/2022   Essential hypertension 11/09/2022   Neurogenic bowel 11/09/2022   Acute blood loss anemia 11/09/2022   Spasticity 11/09/2022   Thoracic myelopathy 10/25/2022   Bilateral leg weakness 10/14/2022   HTN (hypertension) 10/14/2022   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 10/14/2022   Type 2 diabetes mellitus without complication, without long-term current use of insulin (HCC) 07/21/2016    PCP: Andreas Blower, MD  REFERRING PROVIDER: Genice Rouge, MD  REFERRING DIAG:  M51.04 (ICD-10-CM) - Thoracic disc disorder with myelopathy  G82.22 (ICD-10-CM) -  Incomplete paraplegia (HCC)    Rationale for Evaluation and Treatment: Rehabilitation  THERAPY DIAG:  Difficulty in walking, not elsewhere classified  Muscle weakness (generalized)  Unsteadiness on feet  Thoracic myelopathy  ONSET DATE: 12/07/22  SUBJECTIVE:                                                                                                                                                                                           SUBJECTIVE STATEMENT:  I've got a baker's cyst on the back of my knee now and I'm still having a  lot of spasms in my legs. I'm still semi-numb from my hips down. I have spasms in my legs and my legs will shake for about 20 seconds. I feel like I've plateaued, I take a step forward then a bunch of steps back. If I still for awhile its hard to get up, I was walking about 3000-4000 steps per day but it was too much for my legs so I dropped it down to 1500. Left leg is generally the culprit with any problems. Going back to the MD in 3-4 weeks to take another look at my neck.  PERTINENT HISTORY:  Patient with recent T10-11 decompressive laminectomy by Dr Dutch Quint, also had low B 12. D/C'd home, but returned due to Duodenitis. Also found to have decreased sensation and strength in legs with balance issues, and B & B challenges. 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  With At level SCI pain   PAIN:  Are you  having pain? Yes: NPRS scale: 5/10 Pain location: BLEs  Pain description: tightening  Aggravating factors: Worse in the evening Relieving factors: rest  PRECAUTIONS: Fall  RED FLAGS: Bowel or bladder incontinence: Yes: Improved since surgery, but still present.    WEIGHT BEARING RESTRICTIONS: No  FALLS:  Has patient fallen in last 6 months? No  LIVING ENVIRONMENT: Lives with: lives with their family Lives in: House/apartment Stairs: Yes: External: 1 steps; none Has following equipment at home: Environmental consultant - 2 wheeled and [employer] - 4 wheeled  OCCUPATION: Retired, Hasn't been able to drive, going out, walking  PLOF: Independent  PATIENT GOALS: Be able to move and go places with LRAD, get back to his normal level of activities.  NEXT MD VISIT: Unknown- having another MRI and awaiting results.  OBJECTIVE:   DIAGNOSTIC FINDINGS:  MRI 10/25/22 CERVICAL SPINE:   1. C4-C5 severe spinal canal stenosis with moderate right and severe left neural foraminal narrowing. 2. C3-C4 moderate to severe spinal canal stenosis and severe bilateral neural foraminal narrowing. 3. C2-C3 mild spinal canal stenosis and moderate to severe bilateral neural foraminal narrowing. 4. C1-C2 mild-to-moderate spinal canal stenosis, secondary to pannus formation posterior to the dens and ligamentum flavum hypertrophy.   THORACIC SPINE:   1. T10-T11 moderate spinal canal stenosis with moderate to severe left and moderate right neural foraminal narrowing. 2. T11-T12 moderate spinal canal stenosis and mild bilateral neural foraminal narrowing.   LUMBAR SPINE:   1. L2-L3 and L3-L4 moderate spinal canal stenosis and mild left neural foraminal narrowing. 2. L1-L2 mild spinal canal stenosis and mild-to-moderate right neural foraminal narrowing. 3. L5-S1 moderate bilateral neural foraminal narrowing. In addition a right subarticular disc protrusion and annular fissure likely contacts the descending right S1  nerve roots. 4. Narrowing of the lateral recesses throughout the lumbar spine, which can affect the descending nerve roots at each level.  PATIENT SURVEYS:  FOTO 33.2  COGNITION: Overall cognitive status: Within functional limits for tasks assessed  SENSATION: Patient reports T&N in hands at times, feet are cold  MUSCLE LENGTH: Hamstrings: Right 45 deg; Left 45 deg Thomas test: B hip flexors tight  POSTURE: rounded shoulders, forward head, decreased lumbar lordosis, and decreased thoracic kyphosis  PALPATION: No TTP, tight all over.  LUMBAR ROM: Deferred due to back precautions.  LOWER EXTREMITY ROM:   B hips with mod limitations in flexion ext, IR, B knees lack full extension  LOWER EXTREMITY MMT:    MMT Right eval Left eval  Hip flexion 3+ 3+  Hip extension 3- 3-  Hip abduction  3+  Hip adduction    Hip internal rotation    Hip external rotation    Knee flexion 4- 4-  Knee extension 4- 4-  Ankle dorsiflexion 4- 4-  Ankle plantarflexion    Ankle inversion    Ankle eversion     (Blank rows = not tested)  FUNCTIONAL TESTS:  5 times sit to stand: 31.8 Timed up and go (TUG): TBD 3 minute walk test: TBD  GAIT: Distance walked: 87' Assistive device utilized: Environmental consultant - 4 wheeled Level of assistance: Modified independence Comments: Slow, relies on UE support, small step length, poor control of either LE.  TODAY'S TREATMENT:                                                                                                                              DATE:   01/25/23  TherEx  Nustep L4x6 minutes BLEs only Bridges x10 Supine clams + TA set red TB x10 Seated LAQs red TB x10 B feet not touching ground/cues for core activation Seated marches red TB + core set x10 B STS with red TB above knees x10 no UEs    NMR  Semi-tandem stance solid surface 2x30 seconds B Narrow BOS 2x30 seconds B     12/28/22 Education    PATIENT EDUCATION:  Education details:  POC Person educated: Patient Education method: Explanation Education comprehension: verbalized understanding  HOME EXERCISE PROGRAM: TBD  ASSESSMENT:  CLINICAL IMPRESSION:  Alika arrived today doing OK, we worked on functional strength and balance this session to tolerance. Encouraged f/u with MD about LE spasms that are keeping him up at night and causing some considerable pain. Will continue to progress as tolerated.   OBJECTIVE IMPAIRMENTS: Abnormal gait, decreased activity tolerance, decreased balance, decreased coordination, decreased mobility, difficulty walking, decreased ROM, decreased strength, postural dysfunction, obesity, and pain.   ACTIVITY LIMITATIONS: carrying, lifting, bending, stairs, transfers, reach over head, and locomotion level  PARTICIPATION LIMITATIONS: meal prep, cleaning, laundry, driving, and shopping  PERSONAL FACTORS: Past/current experiences are also affecting patient's functional outcome.   REHAB POTENTIAL: Good  CLINICAL DECISION MAKING: Stable/uncomplicated  EVALUATION COMPLEXITY: Low   GOALS: Goals reviewed with patient? Yes  SHORT TERM GOALS: Target date: 01/15/23  I with initial HEP Baseline: Goal status: INITIAL LONG TERM GOALS: Target date: 03/08/23  I with final HEP Baseline:  Goal status: INITIAL  2.  Increase FOTO to within 5 points of predicated end score. Baseline: 33 Goal status: INITIAL  3.  Complete 5 x sit to stand in < 15 sec to demonstrate improved balance and strength Baseline: 30 sec with hands on knees, leaning back against mat at times. Goal status: INITIAL  4.  Complete TUG in < 15 sec with LRAD Baseline: TBD Goal status: INITIAL  5.  Complete 6 minute walk test, walking at least 800', with LRAD. Baseline: TBD Goal status: INITIAL  6.  Patient will score at least 21/30 on FGA Baseline: Deferred due to difficulty with walking. Goal status: INITIAL  PLAN:  PT FREQUENCY: 1-2x/week  PT DURATION: 10  weeks  PLANNED INTERVENTIONS: Therapeutic exercises, Therapeutic activity, Neuromuscular re-education, Balance training, Gait training, Patient/Family education, Self Care, Joint mobilization, Stair training, Electrical stimulation, Cryotherapy, Moist heat, Ionotophoresis 4mg /ml Dexamethasone, and Manual therapy.  PLAN FOR NEXT SESSION: TUG, HEP; functional strength, balance, functional activity tolerance. Per epic secure chat with surgeon, no restrictions, activity as tolerated   Nedra Hai, PT, DPT 01/25/23 3:13 PM

## 2023-01-25 NOTE — Telephone Encounter (Signed)
Patient called stating that he started Baclofen, Duloxetine taking as directed since 12/07/22 and no change in spasms. Please advise.

## 2023-01-27 ENCOUNTER — Ambulatory Visit: Payer: Medicare HMO | Admitting: Physical Therapy

## 2023-01-27 ENCOUNTER — Encounter: Payer: Self-pay | Admitting: Physical Therapy

## 2023-01-27 DIAGNOSIS — M6281 Muscle weakness (generalized): Secondary | ICD-10-CM

## 2023-01-27 DIAGNOSIS — R2681 Unsteadiness on feet: Secondary | ICD-10-CM

## 2023-01-27 DIAGNOSIS — R262 Difficulty in walking, not elsewhere classified: Secondary | ICD-10-CM

## 2023-01-27 DIAGNOSIS — M4714 Other spondylosis with myelopathy, thoracic region: Secondary | ICD-10-CM

## 2023-01-27 NOTE — Therapy (Signed)
OUTPATIENT PHYSICAL THERAPY THORACOLUMBAR TREATMENT   Patient Name: Mitchell Morgan MRN: [id number] DOB:[birth year], 77 y.o., male Today's Date: 01/27/2023  END OF SESSION:  PT End of Session - 01/27/23 1456     Visit Number 3    Date for PT Re-Evaluation 03/08/23    PT Start Time 1556    PT Stop Time 1635    PT Time Calculation (min) 39 min    Activity Tolerance Patient tolerated treatment well    Behavior During Therapy WFL for tasks assessed/performed            Past Medical History:  Diagnosis Date   Basal cell carcinoma    left cheek   BPH (benign prostatic hyperplasia)    Diverticulosis    GERD (gastroesophageal reflux disease)    High cholesterol    Hip pain, bilateral    left > right   Hypertension    Insomnia    Knee pain, bilateral    Prediabetes    Rotator cuff disorder, left    Past Surgical History:  Procedure Laterality Date   DECOMPRESSIVE LUMBAR LAMINECTOMY LEVEL 1 N/A 10/15/2022   Procedure: DECOMPRESSIVE thoracic laminectomy THORACIC TEN -ELEVEN;  Surgeon: Julio Sicks, MD;  Location: [facility] OR;  Service: Neurosurgery;  Laterality: N/A;   Patient Active Problem List   Diagnosis Date Noted   Nerve pain 12/07/2022   Incomplete paraplegia (HCC) 12/07/2022   Thoracic disc disorder with myelopathy 12/07/2022   Duodenitis without bleeding 11/09/2022   Neurogenic bladder 11/09/2022   Essential hypertension 11/09/2022   Neurogenic bowel 11/09/2022   Acute blood loss anemia 11/09/2022   Spasticity 11/09/2022   Thoracic myelopathy 10/25/2022   Bilateral leg weakness 10/14/2022   HTN (hypertension) 10/14/2022   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 10/14/2022   Type 2 diabetes mellitus without complication, without long-term current use of insulin (HCC) 07/21/2016    PCP: Andreas Blower, MD  REFERRING PROVIDER: Genice Rouge, MD  REFERRING DIAG:  M51.04 (ICD-10-CM) - Thoracic disc disorder with myelopathy  G82.22 (ICD-10-CM) -  Incomplete paraplegia (HCC)   Rationale for Evaluation and Treatment: Rehabilitation  THERAPY DIAG:  Difficulty in walking, not elsewhere classified  Muscle weakness (generalized)  Unsteadiness on feet  Thoracic myelopathy  ONSET DATE: 12/07/22  SUBJECTIVE:                                                                                                                                                                                           SUBJECTIVE STATEMENT: Patient reports continued spasming in his legs. He notes them when sitting or at night. He feels that walking helps.  I'm still having a lot of spasms in my legs. I'm still semi-numb from my hips down. I have spasms in my legs and my legs will shake for about 20 seconds. I feel like I've plateaued, I take a step forward then a bunch of steps back. If I still for awhile its hard to get up, I was walking about 3000-4000 steps per day but it was too much for my legs so I dropped it down to 1500. Left leg is generally the culprit with any problems. Going back to the MD in 3-4 weeks to take another look at my neck.  PERTINENT HISTORY:  Patient with recent T10-11 decompressive laminectomy by Dr Dutch Quint, also had low B 12. D/C'd home, but returned due to Duodenitis. Also found to have decreased sensation and strength in legs with balance issues, and B & B challenges. 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  With At level SCI pain  PAIN:  Are you having pain? Yes: NPRS scale: 5/10 Pain location: BLEs  Pain description: tightening  Aggravating factors: Worse in the evening Relieving factors: rest  PRECAUTIONS: Fall  RED FLAGS: Bowel or bladder incontinence: Yes: Improved since surgery, but still present.    WEIGHT BEARING RESTRICTIONS: No  FALLS:  Has patient fallen in last 6 months? No  LIVING ENVIRONMENT: Lives with: lives with their family Lives in: House/apartment Stairs: Yes: External: 1 steps; none Has following equipment at home: Environmental consultant - 2 wheeled and [employer] - 4 wheeled  OCCUPATION: Retired, Hasn't been able to drive, going out, walking  PLOF: Independent  PATIENT GOALS: Be able to move and go places with LRAD, get back to his normal level of activities.  NEXT MD VISIT: Unknown- having another MRI and awaiting results.  OBJECTIVE:   DIAGNOSTIC FINDINGS:  MRI 10/25/22 CERVICAL SPINE:   1. C4-C5 severe spinal canal stenosis with moderate right and severe left neural foraminal narrowing. 2. C3-C4 moderate to severe spinal canal stenosis and severe bilateral neural foraminal narrowing. 3. C2-C3 mild spinal canal stenosis and moderate to severe bilateral neural foraminal narrowing. 4. C1-C2 mild-to-moderate spinal canal stenosis, secondary to pannus formation posterior to the dens and ligamentum flavum hypertrophy.   THORACIC SPINE:   1. T10-T11 moderate spinal canal stenosis with moderate to severe left and moderate right neural foraminal narrowing. 2. T11-T12 moderate spinal canal stenosis and mild bilateral neural foraminal narrowing.   LUMBAR SPINE:   1. L2-L3 and L3-L4 moderate spinal canal stenosis and mild left neural foraminal narrowing. 2. L1-L2 mild spinal canal stenosis and mild-to-moderate right neural foraminal narrowing. 3. L5-S1 moderate bilateral neural foraminal narrowing. In addition a right subarticular disc protrusion and  annular fissure likely contacts the descending right S1 nerve roots. 4. Narrowing of the lateral recesses throughout the lumbar spine, which can affect the descending nerve roots at each level.  PATIENT SURVEYS:  FOTO 33.2  COGNITION: Overall cognitive status: Within functional limits for  tasks assessed     SENSATION: Patient reports T&N in hands at times, feet are cold  MUSCLE LENGTH: Hamstrings: Right 45 deg; Left 45 deg Thomas test: B hip flexors tight  POSTURE: rounded shoulders, forward head, decreased lumbar lordosis, and decreased thoracic kyphosis  PALPATION: No TTP, tight all over.  LUMBAR ROM: Deferred due to back precautions.  LOWER EXTREMITY ROM:   B hips with mod limitations in flexion ext, IR, B knees lack full extension  LOWER EXTREMITY MMT:    MMT Right eval Left eval  Hip flexion 3+ 3+  Hip extension 3- 3-  Hip abduction  3+  Hip adduction    Hip internal rotation    Hip external rotation    Knee flexion 4- 4-  Knee extension 4- 4-  Ankle dorsiflexion 4- 4-  Ankle plantarflexion    Ankle inversion    Ankle eversion     (Blank rows = not tested)  FUNCTIONAL TESTS:  5 times sit to stand: 31.8 Timed up and go (TUG): TBD 3 minute walk test: TBD  GAIT: Distance walked: 62' Assistive device utilized: Environmental consultant - 4 wheeled Level of assistance: Modified independence Comments: Slow, relies on UE support, small step length, poor control of either LE.  TODAY'S TREATMENT:                                                                                                                              DATE:  01/27/23 NuStep L5 x 6 minutes, Reported some pain in L knee, improved when resistance lowered to L4 Supine stretching- SKTC, DKTC, figure 4, glut Supine strength- Pelvic tilts, bridging Seated HS stretch, Knee flex and ext against resistance  01/25/23 TherEx Nustep L4x6 minutes BLEs only Bridges x10 Supine clams + TA set red TB x10 Seated LAQs red  TB x10 B feet not touching ground/cues for core activation Seated marches red TB + core set x10 B STS with red TB above knees x10 no UEs  NMR Semi-tandem stance solid surface 2x30 seconds B Narrow BOS 2x30 seconds B   12/28/22 Education   PATIENT EDUCATION:  Education details: POC Person educated: Patient Education method: Explanation Education comprehension: verbalized understanding  HOME EXERCISE PROGRAM: TBD  ASSESSMENT:  CLINICAL IMPRESSION: Patient reports continued spasms in legs, worse at night. He has tried to contact his Dr, awaiting her response. Initiated HEP today with some stretch and strengthen. He reports B groin pain at times.  OBJECTIVE IMPAIRMENTS: Abnormal gait, decreased activity tolerance, decreased balance, decreased coordination, decreased mobility, difficulty walking, decreased ROM, decreased strength, postural dysfunction, obesity, and pain.   ACTIVITY LIMITATIONS: carrying, lifting, bending, stairs, transfers, reach over head, and locomotion level  PARTICIPATION LIMITATIONS: meal prep, cleaning, laundry, driving, and shopping  PERSONAL FACTORS: Past/current experiences are also affecting patient's functional outcome.   REHAB POTENTIAL: Good  CLINICAL DECISION MAKING: Stable/uncomplicated  EVALUATION COMPLEXITY: Low   GOALS: Goals reviewed with patient? Yes  SHORT TERM GOALS:  Target date: 01/15/23  I with initial HEP Baseline: Goal status: 01/27/23-initiated, ongoin  LONG TERM GOALS: Target date: 03/08/23  I with final HEP Baseline:  Goal status: INITIAL  2.  Increase FOTO to within 5 points of predicated end score. Baseline: 33 Goal status: INITIAL  3.  Complete 5 x sit to stand in < 15 sec to demonstrate improved balance and strength Baseline: 30 sec with hands on knees, leaning back against mat at times. Goal status: INITIAL  4.  Complete TUG in < 15 sec with LRAD Baseline: TBD Goal status: INITIAL  5.  Complete 6 minute walk  test, walking at least 800', with LRAD. Baseline: TBD Goal status: INITIAL  6.  Patient will score at least 21/30 on FGA Baseline: Deferred due to difficulty with walking. Goal status: INITIAL  PLAN:  PT FREQUENCY: 1-2x/week  PT DURATION: 10 weeks  PLANNED INTERVENTIONS: Therapeutic exercises, Therapeutic activity, Neuromuscular re-education, Balance training, Gait training, Patient/Family education, Self Care, Joint mobilization, Stair training, Electrical stimulation, Cryotherapy, Moist heat, Ionotophoresis 4mg /ml Dexamethasone, and Manual therapy.  PLAN FOR NEXT SESSION: TUG, HEP; functional strength, balance, functional activity tolerance. Per epic secure chat with surgeon, no restrictions, activity as tolerated  Assess B hips due to reports of groin pain.  Oley Balm DPT 01/27/23 3:47 PM

## 2023-01-27 NOTE — Telephone Encounter (Signed)
L/m on voicemail 01/27/23 at 12:36pm

## 2023-02-01 ENCOUNTER — Ambulatory Visit: Payer: Medicare HMO | Admitting: Physical Therapy

## 2023-02-01 ENCOUNTER — Encounter: Payer: Self-pay | Admitting: Physical Therapy

## 2023-02-01 DIAGNOSIS — R262 Difficulty in walking, not elsewhere classified: Secondary | ICD-10-CM

## 2023-02-01 DIAGNOSIS — R2681 Unsteadiness on feet: Secondary | ICD-10-CM

## 2023-02-01 DIAGNOSIS — M4714 Other spondylosis with myelopathy, thoracic region: Secondary | ICD-10-CM

## 2023-02-01 DIAGNOSIS — M6281 Muscle weakness (generalized): Secondary | ICD-10-CM

## 2023-02-01 NOTE — Therapy (Signed)
OUTPATIENT PHYSICAL THERAPY THORACOLUMBAR TREATMENT   Patient Name: Mitchell Morgan MRN: [id number] DOB:1946/02/01, 78 y.o., male Today's Date: 02/01/2023  END OF SESSION:  PT End of Session - 02/01/23 1320     Visit Number 4    Date for PT Re-Evaluation 03/08/23    PT Start Time 1320    PT Stop Time 1400    PT Time Calculation (min) 40 min            Past Medical History:  Diagnosis Date   Basal cell carcinoma    left cheek   BPH (benign prostatic hyperplasia)    Diverticulosis    GERD (gastroesophageal reflux disease)    High cholesterol    Hip pain, bilateral    left > right   Hypertension    Insomnia    Knee pain, bilateral    Prediabetes    Rotator cuff disorder, left    Past Surgical History:  Procedure Laterality Date   DECOMPRESSIVE LUMBAR LAMINECTOMY LEVEL 1 N/A 10/15/2022   Procedure: DECOMPRESSIVE thoracic laminectomy THORACIC TEN -ELEVEN;  Surgeon: Julio Sicks, MD;  Location: [facility] OR;  Service: Neurosurgery;  Laterality: N/A;   Patient Active Problem List   Diagnosis Date Noted   Nerve pain 12/07/2022   Incomplete paraplegia (HCC) 12/07/2022   Thoracic disc disorder with myelopathy 12/07/2022   Duodenitis without bleeding 11/09/2022   Neurogenic bladder 11/09/2022   Essential hypertension 11/09/2022   Neurogenic bowel 11/09/2022   Acute blood loss anemia 11/09/2022   Spasticity 11/09/2022   Thoracic myelopathy 10/25/2022   Bilateral leg weakness 10/14/2022   HTN (hypertension) 10/14/2022   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 10/14/2022   Type 2 diabetes mellitus without complication, without long-term current use of insulin (HCC) 07/21/2016    PCP: Andreas Blower, MD  REFERRING PROVIDER: Genice Rouge, MD  REFERRING DIAG:  M51.04 (ICD-10-CM) - Thoracic disc disorder with myelopathy  G82.22 (ICD-10-CM) - Incomplete paraplegia (HCC)   Rationale for Evaluation and Treatment: Rehabilitation  THERAPY DIAG:  Difficulty in  walking, not elsewhere classified  Muscle weakness (generalized)  Unsteadiness on feet  Thoracic myelopathy  ONSET DATE: 12/07/22  SUBJECTIVE:                                                                                                                                                                                           SUBJECTIVE STATEMENT: Patient reports some improvement with his muscle spasms, but still present.  I'm still having a lot of spasms in my legs. I'm still semi-numb from my hips down. I have spasms in my legs and my legs will shake  for about 20 seconds. I feel like I've plateaued, I take a step forward then a bunch of steps back. If I still for awhile its hard to get up, I was walking about 3000-4000 steps per day but it was too much for my legs so I dropped it down to 1500. Left leg is generally the culprit with any problems. Going back to the MD in 3-4 weeks to take another look at my neck.  PERTINENT HISTORY:  Patient with recent T10-11 decompressive laminectomy by Dr Dutch Quint, also had low B 12. D/C'd home, but returned due to Duodenitis. Also found to have decreased sensation and strength in legs with balance issues, and B & B challenges. 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  With At level SCI pain  PAIN:  Are you having pain? Yes: NPRS scale: 5/10 Pain location: BLEs  Pain description: tightening  Aggravating  factors: Worse in the evening Relieving factors: rest  PRECAUTIONS: Fall  RED FLAGS: Bowel or bladder incontinence: Yes: Improved since surgery, but still present.    WEIGHT BEARING RESTRICTIONS: No  FALLS:  Has patient fallen in last 6 months? No  LIVING ENVIRONMENT: Lives with: lives with their family Lives in: House/apartment Stairs: Yes: External: 1 steps; none Has following equipment at home: Environmental consultant - 2 wheeled and [employer] - 4 wheeled  OCCUPATION: Retired, Hasn't been able to drive, going out, walking  PLOF: Independent  PATIENT GOALS: Be able to move and go places with LRAD, get back to his normal level of activities.  NEXT MD VISIT: Unknown- having another MRI and awaiting results.  OBJECTIVE:   DIAGNOSTIC FINDINGS:  MRI 10/25/22 CERVICAL SPINE:   1. C4-C5 severe spinal canal stenosis with moderate right and severe left neural foraminal narrowing. 2. C3-C4 moderate to severe spinal canal stenosis and severe bilateral neural foraminal narrowing. 3. C2-C3 mild spinal canal stenosis and moderate to severe bilateral neural foraminal narrowing. 4. C1-C2 mild-to-moderate spinal canal stenosis, secondary to pannus formation posterior to the dens and ligamentum flavum hypertrophy.   THORACIC SPINE:   1. T10-T11 moderate spinal canal stenosis with moderate to severe left and moderate right neural foraminal narrowing. 2. T11-T12 moderate spinal canal stenosis and mild bilateral neural foraminal narrowing.   LUMBAR SPINE:   1. L2-L3 and L3-L4 moderate spinal canal stenosis and mild left neural foraminal narrowing. 2. L1-L2 mild spinal canal stenosis and mild-to-moderate right neural foraminal narrowing. 3. L5-S1 moderate bilateral neural foraminal narrowing. In addition a right subarticular disc protrusion and annular fissure likely contacts the descending right S1 nerve roots. 4. Narrowing of the lateral recesses throughout the lumbar spine, which can affect the  descending nerve roots at each level.  PATIENT SURVEYS:  FOTO 33.2  COGNITION: Overall cognitive status: Within functional limits for tasks assessed     SENSATION: Patient reports T&N in hands at times, feet are cold  MUSCLE LENGTH: Hamstrings: Right 45 deg; Left 45 deg Maisie Fus  test: B hip flexors tight  POSTURE: rounded shoulders, forward head, decreased lumbar lordosis, and decreased thoracic kyphosis  PALPATION: No TTP, tight all over.  LUMBAR ROM: Deferred due to back precautions.  LOWER EXTREMITY ROM:   B hips with mod limitations in flexion ext, IR, B knees lack full extension  LOWER EXTREMITY MMT:    MMT Right eval Left eval  Hip flexion 3+ 3+  Hip extension 3- 3-  Hip abduction  3+  Hip adduction    Hip internal rotation    Hip external rotation    Knee flexion 4- 4-  Knee extension 4- 4-  Ankle dorsiflexion 4- 4-  Ankle plantarflexion    Ankle inversion    Ankle eversion     (Blank rows = not tested)  FUNCTIONAL TESTS:  5 times sit to stand: 31.8 Timed up and go (TUG): TBD 3 minute walk test: TBD  GAIT: Distance walked: 77' Assistive device utilized: Environmental consultant - 4 wheeled Level of assistance: Modified independence Comments: Slow, relies on UE support, small step length, poor control of either LE.  TODAY'S TREATMENT:                                                                                                                              DATE:  02/01/23 NuStep L5 x 6 minutes Supine LTR Supine strengthening with physioball- B KTC and hold in pelvic tilt, bridge, bridge with hip IR, bridge with ball roll, ball crunches, 10 reps each. TUG See goals. Sit <> Stand x 10 from elevated mat, hands on Rollator out in front. Standing heel raises, 2 x 10 reps with BUE support, on floor Ball on wall standing hip abd x 10 on each side, with BUE support on Rollator and CGA  01/27/23 NuStep L5 x 6 minutes, Reported some pain in L knee, improved when resistance  lowered to L4 Supine stretching- SKTC, DKTC, figure 4, glut Supine strength- Pelvic tilts, bridging Seated HS stretch, Knee flex and ext against resistance  01/25/23 TherEx Nustep L4x6 minutes BLEs only Bridges x10 Supine clams + TA set red TB x10 Seated LAQs red TB x10 B feet not touching ground/cues for core activation Seated marches red TB + core set x10 B STS with red TB above knees x10 no UEs  NMR Semi-tandem stance solid surface 2x30 seconds B Narrow BOS 2x30 seconds B   12/28/22 Education   PATIENT EDUCATION:  Education details: POC Person educated: Patient Education method: Explanation Education comprehension: verbalized understanding  HOME EXERCISE PROGRAM: [id number]  ASSESSMENT:  CLINICAL IMPRESSION: Patient reports improved spasms in legs, but still worse at night. Treatment focused on trunk stability and LE strength. Utilized physioball. Tolerated all activities iwhtout C/O increased pain.  OBJECTIVE IMPAIRMENTS: Abnormal gait, decreased activity tolerance, decreased balance, decreased coordination, decreased mobility, difficulty walking, decreased ROM, decreased strength, postural dysfunction, obesity, and pain.   ACTIVITY LIMITATIONS: carrying, lifting, bending, stairs, transfers, reach over head, and locomotion level  PARTICIPATION LIMITATIONS: meal prep, cleaning, laundry, driving, and shopping  PERSONAL FACTORS: Past/current experiences are also affecting patient's functional outcome.   REHAB POTENTIAL: Good  CLINICAL DECISION MAKING: Stable/uncomplicated  EVALUATION COMPLEXITY: Low   GOALS: Goals reviewed with patient? Yes  SHORT TERM GOALS: Target date: 01/15/23  I with initial HEP Baseline: Goal status: 01/27/23-initiated, ongoin  LONG TERM GOALS: Target date: 03/08/23  I with final HEP Baseline:  Goal status: INITIAL  2.  Increase FOTO to within 5 points of predicated end score. Baseline: 33 Goal status: INITIAL  3.  Complete 5 x  sit to stand in < 15 sec to demonstrate improved balance and strength Baseline: 30 sec with hands on knees, leaning back against mat at times. Goal status: INITIAL  4.  Complete TUG in < 15 sec with LRAD Baseline: 02/01/23-20.4 Goal status: Ongoing  5.  Complete 6 minute walk test, walking at least 800', with LRAD. Baseline: TBD Goal status: INITIAL  6.  Patient will score at least 21/30 on FGA Baseline: Deferred due to difficulty with walking. Goal status: INITIAL  PLAN:  PT FREQUENCY: 1-2x/week  PT DURATION: 10 weeks  PLANNED INTERVENTIONS: Therapeutic exercises, Therapeutic activity, Neuromuscular re-education, Balance training, Gait training, Patient/Family education, Self Care, Joint mobilization, Stair training, Electrical stimulation, Cryotherapy, Moist heat, Ionotophoresis 4mg /ml Dexamethasone, and Manual therapy.  PLAN FOR NEXT SESSION: TUG, HEP; functional strength, balance, functional activity tolerance. Per epic secure chat with surgeon, no restrictions, activity as tolerated  Assess B hips due to reports of groin pain.  Oley Balm DPT 02/01/23 2:30 PM

## 2023-02-03 ENCOUNTER — Encounter: Payer: Self-pay | Admitting: Physical Therapy

## 2023-02-03 ENCOUNTER — Ambulatory Visit: Payer: Medicare HMO | Admitting: Physical Therapy

## 2023-02-03 DIAGNOSIS — R2681 Unsteadiness on feet: Secondary | ICD-10-CM

## 2023-02-03 DIAGNOSIS — M6281 Muscle weakness (generalized): Secondary | ICD-10-CM

## 2023-02-03 DIAGNOSIS — M4714 Other spondylosis with myelopathy, thoracic region: Secondary | ICD-10-CM

## 2023-02-03 DIAGNOSIS — R262 Difficulty in walking, not elsewhere classified: Secondary | ICD-10-CM

## 2023-02-03 NOTE — Therapy (Signed)
OUTPATIENT PHYSICAL THERAPY THORACOLUMBAR TREATMENT   Patient Name: Mitchell Morgan MRN: [id number] DOB:01-24-1946, 77 y.o., male Today's Date: 02/03/2023  END OF SESSION:  PT End of Session - 02/03/23 1307     Visit Number 5    Date for PT Re-Evaluation 03/08/23    PT Start Time 1307    PT Stop Time 1350    PT Time Calculation (min) 43 min    Activity Tolerance Patient tolerated treatment well    Behavior During Therapy WFL for tasks assessed/performed            Past Medical History:  Diagnosis Date   Basal cell carcinoma    left cheek   BPH (benign prostatic hyperplasia)    Diverticulosis    GERD (gastroesophageal reflux disease)    High cholesterol    Hip pain, bilateral    left > right   Hypertension    Insomnia    Knee pain, bilateral    Prediabetes    Rotator cuff disorder, left    Past Surgical History:  Procedure Laterality Date   DECOMPRESSIVE LUMBAR LAMINECTOMY LEVEL 1 N/A 10/15/2022   Procedure: DECOMPRESSIVE thoracic laminectomy THORACIC TEN -ELEVEN;  Surgeon: Julio Sicks, MD;  Location: [facility] OR;  Service: Neurosurgery;  Laterality: N/A;   Patient Active Problem List   Diagnosis Date Noted   Nerve pain 12/07/2022   Incomplete paraplegia (HCC) 12/07/2022   Thoracic disc disorder with myelopathy 12/07/2022   Duodenitis without bleeding 11/09/2022   Neurogenic bladder 11/09/2022   Essential hypertension 11/09/2022   Neurogenic bowel 11/09/2022   Acute blood loss anemia 11/09/2022   Spasticity 11/09/2022   Thoracic myelopathy 10/25/2022   Bilateral leg weakness 10/14/2022   HTN (hypertension) 10/14/2022   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 10/14/2022   Type 2 diabetes mellitus without complication, without long-term current use of insulin (HCC) 07/21/2016    PCP: Andreas Blower, MD  REFERRING PROVIDER: Genice Rouge, MD  REFERRING DIAG:  M51.04 (ICD-10-CM) - Thoracic disc disorder with myelopathy  G82.22 (ICD-10-CM) -  Incomplete paraplegia (HCC)   Rationale for Evaluation and Treatment: Rehabilitation  THERAPY DIAG:  Difficulty in walking, not elsewhere classified  Muscle weakness (generalized)  Unsteadiness on feet  Thoracic myelopathy  ONSET DATE: 12/07/22  SUBJECTIVE:                                                                                                                                                                                           SUBJECTIVE STATEMENT: Patient reports that his spasms have returned the last 2 nights.  I'm still having a lot of spasms in my  legs. I'm still semi-numb from my hips down. I have spasms in my legs and my legs will shake for about 20 seconds. I feel like I've plateaued, I take a step forward then a bunch of steps back. If I still for awhile its hard to get up, I was walking about 3000-4000 steps per day but it was too much for my legs so I dropped it down to 1500. Left leg is generally the culprit with any problems. Going back to the MD in 3-4 weeks to take another look at my neck.  PERTINENT HISTORY:  Patient with recent T10-11 decompressive laminectomy by Dr Dutch Quint, also had low B 12. D/C'd home, but returned due to Duodenitis. Also found to have decreased sensation and strength in legs with balance issues, and B & B challenges. 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  With At level SCI pain  PAIN:  Are  you having pain? Yes: NPRS scale: 5/10 Pain location: BLEs  Pain description: tightening  Aggravating factors: Worse in the evening Relieving factors: rest  PRECAUTIONS: Fall  RED FLAGS: Bowel or bladder incontinence: Yes: Improved since surgery, but still present.    WEIGHT BEARING RESTRICTIONS: No  FALLS:  Has patient fallen in last 6 months? No  LIVING ENVIRONMENT: Lives with: lives with their family Lives in: House/apartment Stairs: Yes: External: 1 steps; none Has following equipment at home: Environmental consultant - 2 wheeled and [employer] - 4 wheeled  OCCUPATION: Retired, Hasn't been able to drive, going out, walking  PLOF: Independent  PATIENT GOALS: Be able to move and go places with LRAD, get back to his normal level of activities.  NEXT MD VISIT: Unknown- having another MRI and awaiting results.  OBJECTIVE:   DIAGNOSTIC FINDINGS:  MRI 10/25/22 CERVICAL SPINE:   1. C4-C5 severe spinal canal stenosis with moderate right and severe left neural foraminal narrowing. 2. C3-C4 moderate to severe spinal canal stenosis and severe bilateral neural foraminal narrowing. 3. C2-C3 mild spinal canal stenosis and moderate to severe bilateral neural foraminal narrowing. 4. C1-C2 mild-to-moderate spinal canal stenosis, secondary to pannus formation posterior to the dens and ligamentum flavum hypertrophy.   THORACIC SPINE:   1. T10-T11 moderate spinal canal stenosis with moderate to severe left and moderate right neural foraminal narrowing. 2. T11-T12 moderate spinal canal stenosis and mild bilateral neural foraminal narrowing.   LUMBAR SPINE:   1. L2-L3 and L3-L4 moderate spinal canal stenosis and mild left neural foraminal narrowing. 2. L1-L2 mild spinal canal stenosis and mild-to-moderate right neural foraminal narrowing. 3. L5-S1 moderate bilateral neural foraminal narrowing. In addition a right subarticular disc protrusion and annular fissure likely contacts the descending right  S1 nerve roots. 4. Narrowing of the lateral recesses throughout the lumbar spine, which can affect the descending nerve roots at each level.  PATIENT SURVEYS:  FOTO 33.2  COGNITION: Overall cognitive status: Within functional limits for tasks assessed     SENSATION: Patient reports  T&N in hands at times, feet are cold  MUSCLE LENGTH: Hamstrings: Right 45 deg; Left 45 deg Thomas test: B hip flexors tight  POSTURE: rounded shoulders, forward head, decreased lumbar lordosis, and decreased thoracic kyphosis  PALPATION: No TTP, tight all over.  LUMBAR ROM: Deferred due to back precautions.  LOWER EXTREMITY ROM:   B hips with mod limitations in flexion ext, IR, B knees lack full extension  LOWER EXTREMITY MMT:    MMT Right eval Left eval  Hip flexion 3+ 3+  Hip extension 3- 3-  Hip abduction  3+  Hip adduction    Hip internal rotation    Hip external rotation    Knee flexion 4- 4-  Knee extension 4- 4-  Ankle dorsiflexion 4- 4-  Ankle plantarflexion    Ankle inversion    Ankle eversion     (Blank rows = not tested)  FUNCTIONAL TESTS:  5 times sit to stand: 31.8 Timed up and go (TUG): TBD 3 minute walk test: TBD  GAIT: Distance walked: 18' Assistive device utilized: Environmental consultant - 4 wheeled Level of assistance: Modified independence Comments: Slow, relies on UE support, small step length, poor control of either LE.  TODAY'S TREATMENT:                                                                                                                              DATE:  02/03/23 Bike L1 x 6 minutes Supine manual traction-Patient did report some stretch in low back, but hurt his R ant hip Sitting lower trunk post mobilization with tactile cueing to engage lower body Seated forward flexion over physioball F/B stretch to R and L. Therapist demosntrated how patient could use his Rollator to perform at home. Side lying hip flexor stretch, not very effective, so moved to supine  for modified Thomas stretch, more effective.  Seated chest press with 2# ball while maintaining upright posture to activate trunk for stability.  02/01/23 NuStep L5 x 6 minutes Supine LTR Supine strengthening with physioball- B KTC and hold in pelvic tilt, bridge, bridge with hip IR, bridge with ball roll, ball crunches, 10 reps each. TUG See goals. Sit <> Stand x 10 from elevated mat, hands on Rollator out in front. Standing heel raises, 2 x 10 reps with BUE support, on floor Ball on wall standing hip abd x 10 on each side, with BUE support on Rollator and CGA  01/27/23 NuStep L5 x 6 minutes, Reported some pain in L knee, improved when resistance lowered to L4 Supine stretching- SKTC, DKTC, figure 4, glut Supine strength- Pelvic tilts, bridging Seated HS stretch, Knee flex and ext against resistance  01/25/23 TherEx Nustep L4x6 minutes BLEs only Bridges x10 Supine clams + TA set red TB x10 Seated LAQs red TB x10 B feet not touching ground/cues for core activation Seated marches red TB + core set x10 B STS with red TB above knees x10 no UEs  NMR Semi-tandem  stance solid surface 2x30 seconds B Narrow BOS 2x30 seconds B   12/28/22 Education   PATIENT EDUCATION:  Education details: POC Person educated: Patient Education method: Explanation Education comprehension: verbalized understanding  HOME EXERCISE PROGRAM: [email or web address]  ASSESSMENT:  CLINICAL IMPRESSION: Patient reports increased spasms in legs since last treatment. He reports L ankle DF spasms are the most severe. Treatment re-addressed various stretching and spinal self mob techniques, which patient was able to demonstrate and report stretch with each activity  OBJECTIVE IMPAIRMENTS: Abnormal gait, decreased activity tolerance, decreased balance, decreased coordination, decreased mobility, difficulty walking, decreased ROM, decreased strength, postural dysfunction, obesity, and pain.   ACTIVITY LIMITATIONS: carrying,  lifting, bending, stairs, transfers, reach over head, and locomotion level  PARTICIPATION LIMITATIONS: meal prep, cleaning, laundry, driving, and shopping  PERSONAL FACTORS: Past/current experiences are also affecting patient's functional outcome.   REHAB POTENTIAL: Good  CLINICAL DECISION MAKING: Stable/uncomplicated  EVALUATION COMPLEXITY: Low   GOALS: Goals reviewed with patient? Yes  SHORT TERM GOALS: Target date: 01/15/23  I with initial HEP Baseline: Goal status: 01/27/23-initiated, ongoin  LONG TERM GOALS: Target date: 03/08/23  I with final HEP Baseline:  Goal status: INITIAL  2.  Increase FOTO to within 5 points of predicated end score. Baseline: 33 Goal status: INITIAL  3.  Complete 5 x sit to stand in < 15 sec to demonstrate improved balance and strength Baseline: 30 sec with hands on knees, leaning back against mat at times. Goal status: INITIAL  4.  Complete TUG in < 15 sec with LRAD Baseline: 02/01/23-20.4 Goal status: Ongoing  5.  Complete 6 minute walk test, walking at least 800', with LRAD. Baseline: TBD Goal status: INITIAL  6.  Patient will score at least 21/30 on FGA Baseline: Deferred due to difficulty with walking. Goal status: INITIAL  PLAN:  PT FREQUENCY: 1-2x/week  PT DURATION: 10 weeks  PLANNED INTERVENTIONS: Therapeutic exercises, Therapeutic activity, Neuromuscular re-education, Balance training, Gait training, Patient/Family education, Self Care, Joint mobilization, Stair training, Electrical stimulation, Cryotherapy, Moist heat, Ionotophoresis 4mg /ml Dexamethasone, and Manual therapy.  PLAN FOR NEXT SESSION: TUG, HEP; functional strength, balance, functional activity tolerance. Per epic secure chat with surgeon, no restrictions, activity as tolerated  Assess B hips due to reports of groin pain.  Oley Balm DPT 02/03/23 2:34 PM

## 2023-02-08 ENCOUNTER — Ambulatory Visit: Payer: Medicare HMO | Admitting: Physical Therapy

## 2023-02-10 ENCOUNTER — Ambulatory Visit: Payer: Medicare HMO | Admitting: Physical Therapy

## 2023-02-10 ENCOUNTER — Encounter: Payer: Self-pay | Admitting: Physical Therapy

## 2023-02-10 DIAGNOSIS — R2681 Unsteadiness on feet: Secondary | ICD-10-CM

## 2023-02-10 DIAGNOSIS — R262 Difficulty in walking, not elsewhere classified: Secondary | ICD-10-CM | POA: Diagnosis not present

## 2023-02-10 DIAGNOSIS — M4714 Other spondylosis with myelopathy, thoracic region: Secondary | ICD-10-CM

## 2023-02-10 DIAGNOSIS — M6281 Muscle weakness (generalized): Secondary | ICD-10-CM

## 2023-02-10 NOTE — Therapy (Signed)
OUTPATIENT PHYSICAL THERAPY THORACOLUMBAR TREATMENT   Patient Name: Mitchell Morgan MRN: [id number] DOB:[birth year], 77 y.o., male Today's Date: 02/10/2023  END OF SESSION:  PT End of Session - 02/10/23 1230     Visit Number 6    Date for PT Re-Evaluation 03/08/23    PT Start Time 1230    PT Stop Time 1310    PT Time Calculation (min) 40 min    Activity Tolerance Patient tolerated treatment well    Behavior During Therapy WFL for tasks assessed/performed            Past Medical History:  Diagnosis Date   Basal cell carcinoma    left cheek   BPH (benign prostatic hyperplasia)    Diverticulosis    GERD (gastroesophageal reflux disease)    High cholesterol    Hip pain, bilateral    left > right   Hypertension    Insomnia    Knee pain, bilateral    Prediabetes    Rotator cuff disorder, left    Past Surgical History:  Procedure Laterality Date   DECOMPRESSIVE LUMBAR LAMINECTOMY LEVEL 1 N/A 10/15/2022   Procedure: DECOMPRESSIVE thoracic laminectomy THORACIC TEN -ELEVEN;  Surgeon: Julio Sicks, MD;  Location: [facility] OR;  Service: Neurosurgery;  Laterality: N/A;   Patient Active Problem List   Diagnosis Date Noted   Nerve pain 12/07/2022   Incomplete paraplegia (HCC) 12/07/2022   Thoracic disc disorder with myelopathy 12/07/2022   Duodenitis without bleeding 11/09/2022   Neurogenic bladder 11/09/2022   Essential hypertension 11/09/2022   Neurogenic bowel 11/09/2022   Acute blood loss anemia 11/09/2022   Spasticity 11/09/2022   Thoracic myelopathy 10/25/2022   Bilateral leg weakness 10/14/2022   HTN (hypertension) 10/14/2022   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 10/14/2022   Type 2 diabetes mellitus without complication, without long-term current use of insulin (HCC) 07/21/2016    PCP: Andreas Blower, MD  REFERRING PROVIDER: Genice Rouge, MD  REFERRING DIAG:  M51.04 (ICD-10-CM) - Thoracic disc disorder with myelopathy  G82.22 (ICD-10-CM) -  Incomplete paraplegia (HCC)   Rationale for Evaluation and Treatment: Rehabilitation  THERAPY DIAG:  Difficulty in walking, not elsewhere classified  Muscle weakness (generalized)  Unsteadiness on feet  Thoracic myelopathy  ONSET DATE: 12/07/22  SUBJECTIVE:                                                                                                                                                                                           SUBJECTIVE STATEMENT: Patient reports that his spasms continue, but are slightly less strong.  I'm still having a lot of spasms in my  legs. I'm still semi-numb from my hips down. I have spasms in my legs and my legs will shake for about 20 seconds. I feel like I've plateaued, I take a step forward then a bunch of steps back. If I still for awhile its hard to get up, I was walking about 3000-4000 steps per day but it was too much for my legs so I dropped it down to 1500. Left leg is generally the culprit with any problems. Going back to the MD in 3-4 weeks to take another look at my neck.  PERTINENT HISTORY:  Patient with recent T10-11 decompressive laminectomy by Dr Dutch Quint, also had low B 12. D/C'd home, but returned due to Duodenitis. Also found to have decreased sensation and strength in legs with balance issues, and B & B challenges. 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  With At level SCI pain  PAIN:   Are you having pain? Yes: NPRS scale: 5/10 Pain location: BLEs  Pain description: tightening  Aggravating factors: Worse in the evening Relieving factors: rest  PRECAUTIONS: Fall  RED FLAGS: Bowel or bladder incontinence: Yes: Improved since surgery, but still present.    WEIGHT BEARING RESTRICTIONS: No  FALLS:  Has patient fallen in last 6 months? No  LIVING ENVIRONMENT: Lives with: lives with their family Lives in: House/apartment Stairs: Yes: External: 1 steps; none Has following equipment at home: Environmental consultant - 2 wheeled and [employer] - 4 wheeled  OCCUPATION: Retired, Hasn't been able to drive, going out, walking  PLOF: Independent  PATIENT GOALS: Be able to move and go places with LRAD, get back to his normal level of activities.  NEXT MD VISIT: Unknown- having another MRI and awaiting results.  OBJECTIVE:   DIAGNOSTIC FINDINGS:  MRI 10/25/22 CERVICAL SPINE:   1. C4-C5 severe spinal canal stenosis with moderate right and severe left neural foraminal narrowing. 2. C3-C4 moderate to severe spinal canal stenosis and severe bilateral neural foraminal narrowing. 3. C2-C3 mild spinal canal stenosis and moderate to severe bilateral neural foraminal narrowing. 4. C1-C2 mild-to-moderate spinal canal stenosis, secondary to pannus formation posterior to the dens and ligamentum flavum hypertrophy.   THORACIC SPINE:   1. T10-T11 moderate spinal canal stenosis with moderate to severe left and moderate right neural foraminal narrowing. 2. T11-T12 moderate spinal canal stenosis and mild bilateral neural foraminal narrowing.   LUMBAR SPINE:   1. L2-L3 and L3-L4 moderate spinal canal stenosis and mild left neural foraminal narrowing. 2. L1-L2 mild spinal canal stenosis and mild-to-moderate right neural foraminal narrowing. 3. L5-S1 moderate bilateral neural foraminal narrowing. In addition a right subarticular disc protrusion and annular fissure likely contacts the descending  right S1 nerve roots. 4. Narrowing of the lateral recesses throughout the lumbar spine, which can affect the descending nerve roots at each level.  PATIENT SURVEYS:  FOTO 33.2  COGNITION: Overall cognitive status: Within functional limits for tasks assessed     SENSATION: Patient reports  T&N in hands at times, feet are cold  MUSCLE LENGTH: Hamstrings: Right 45 deg; Left 45 deg Thomas test: B hip flexors tight  POSTURE: rounded shoulders, forward head, decreased lumbar lordosis, and decreased thoracic kyphosis  PALPATION: No TTP, tight all over.  LUMBAR ROM: Deferred due to back precautions.  LOWER EXTREMITY ROM:   B hips with mod limitations in flexion ext, IR, B knees lack full extension  LOWER EXTREMITY MMT:    MMT Right eval Left eval  Hip flexion 3+ 3+  Hip extension 3- 3-  Hip abduction  3+  Hip adduction    Hip internal rotation    Hip external rotation    Knee flexion 4- 4-  Knee extension 4- 4-  Ankle dorsiflexion 4- 4-  Ankle plantarflexion    Ankle inversion    Ankle eversion     (Blank rows = not tested)  FUNCTIONAL TESTS:  5 times sit to stand: 31.8 Timed up and go (TUG): TBD 3 minute walk test: TBD  GAIT: Distance walked: 73' Assistive device utilized: Environmental consultant - 4 wheeled Level of assistance: Modified independence Comments: Slow, relies on UE support, small step length, poor control of either LE.  TODAY'S TREATMENT:                                                                                                                              DATE:  02/10/23 NuStep L4 x 6 minutes Seated Thoracic mobs with towel roll moved progressively up thoracic spine, 3 x 10 sec holds at each level Seated chin tucks against ball on wall, lumbar support provided to increase upright posture Standing facing mat, thoracic rotation with thread the needle and turn to the ceiling, 3 x each side. Seated hip stability, isometric hip add with ball squeeze x 5 sec,  clamshells against G tband. Sit to/from stand x 10, emphasize trying to control decent, BUE support on Rollator. Heel raises x 10 reps  02/03/23 Bike L1 x 6 minutes Supine manual traction-Patient did report some stretch in low back, but hurt his R ant hip Sitting lower trunk post mobilization with tactile cueing to engage lower body Seated forward flexion over physioball F/B stretch to R and L. Therapist demosntrated how patient could use his Rollator to perform at home. Side lying hip flexor stretch, not very effective, so moved to supine for modified Thomas stretch, more effective.  Seated chest press with 2# ball while maintaining upright posture to activate trunk for stability.  02/01/23 NuStep L5 x 6 minutes Supine LTR Supine strengthening with physioball- B KTC and hold in pelvic tilt, bridge, bridge with hip IR, bridge with ball roll, ball crunches, 10 reps each. TUG See goals. Sit <> Stand x 10 from elevated mat, hands on Rollator out in front. Standing heel raises, 2 x 10 reps with BUE support, on floor Ball on wall standing hip abd x 10 on each side, with BUE support on Rollator and  CGA  01/27/23 NuStep L5 x 6 minutes, Reported some pain in L knee, improved when resistance lowered to L4 Supine stretching- SKTC, DKTC, figure 4, glut Supine strength- Pelvic tilts, bridging Seated HS stretch, Knee flex and ext against resistance  01/25/23 TherEx Nustep L4x6 minutes BLEs only Bridges x10 Supine clams + TA set red TB x10 Seated LAQs red TB x10 B feet not touching ground/cues for core activation Seated marches red TB + core set x10 B STS with red TB above knees x10 no UEs  NMR Semi-tandem stance solid surface 2x30 seconds B Narrow BOS 2x30 seconds B   12/28/22 Education   PATIENT EDUCATION:  Education details: POC Person educated: Patient Education method: Explanation Education comprehension: verbalized understanding  HOME EXERCISE  PROGRAM: [id number]  ASSESSMENT:  CLINICAL IMPRESSION: Patient reports continued leg spasms, but slightly improved. Encouraged him to check back with Dr to see if meds could be adjusted as he is sometimes losing sleep at night. Treatment addressed thoracic and cervical mobs today, finishing with stability exercises for lower body and hips. He tolerated all activities well, felt like the mobilizations did loosen him up a bit.  OBJECTIVE IMPAIRMENTS: Abnormal gait, decreased activity tolerance, decreased balance, decreased coordination, decreased mobility, difficulty walking, decreased ROM, decreased strength, postural dysfunction, obesity, and pain.   ACTIVITY LIMITATIONS: carrying, lifting, bending, stairs, transfers, reach over head, and locomotion level  PARTICIPATION LIMITATIONS: meal prep, cleaning, laundry, driving, and shopping  PERSONAL FACTORS: Past/current experiences are also affecting patient's functional outcome.   REHAB POTENTIAL: Good  CLINICAL DECISION MAKING: Stable/uncomplicated  EVALUATION COMPLEXITY: Low   GOALS: Goals reviewed with patient? Yes  SHORT TERM GOALS: Target date: 01/15/23  I with initial HEP Baseline: Goal status: 01/27/23-initiated, ongoin  LONG TERM GOALS: Target date: 03/08/23  I with final HEP Baseline:  Goal status: INITIAL  2.  Increase FOTO to within 5 points of predicated end score. Baseline: 33 Goal status: INITIAL  3.  Complete 5 x sit to stand in < 15 sec to demonstrate improved balance and strength Baseline: 30 sec with hands on knees, leaning back against mat at times. Goal status: INITIAL  4.  Complete TUG in < 15 sec with LRAD Baseline: 02/01/23-20.4 Goal status: Ongoing  5.  Complete 6 minute walk test, walking at least 800', with LRAD. Baseline: TBD Goal status: INITIAL  6.  Patient will score at least 21/30 on FGA Baseline: Deferred due to difficulty with walking. Goal status: INITIAL  PLAN:  PT FREQUENCY:  1-2x/week  PT DURATION: 10 weeks  PLANNED INTERVENTIONS: Therapeutic exercises, Therapeutic activity, Neuromuscular re-education, Balance training, Gait training, Patient/Family education, Self Care, Joint mobilization, Stair training, Electrical stimulation, Cryotherapy, Moist heat, Ionotophoresis 4mg /ml Dexamethasone, and Manual therapy.  PLAN FOR NEXT SESSION: TUG, HEP; functional strength, balance, functional activity tolerance. Per epic secure chat with surgeon, no restrictions, activity as tolerated  Assess B hips due to reports of groin pain.  Oley Balm DPT 02/10/23 1:12 PM

## 2023-02-16 ENCOUNTER — Ambulatory Visit: Payer: Medicare HMO | Admitting: Physical Therapy

## 2023-02-18 ENCOUNTER — Ambulatory Visit: Payer: Medicare HMO | Admitting: Physical Therapy

## 2023-03-08 ENCOUNTER — Encounter: Payer: Medicare HMO | Admitting: Physical Medicine and Rehabilitation

## 2023-03-09 ENCOUNTER — Ambulatory Visit: Payer: Medicare HMO | Admitting: Physical Therapy

## 2023-03-11 ENCOUNTER — Ambulatory Visit: Payer: Medicare HMO | Admitting: Physical Therapy

## 2023-04-12 ENCOUNTER — Other Ambulatory Visit: Payer: Self-pay | Admitting: Neurosurgery

## 2023-04-21 NOTE — Pre-Procedure Instructions (Signed)
Surgical Instructions   Your procedure is scheduled on April 26, 2023. Report to [facility] Main Entrance "A" at 12:00 P.M., then check in with the Admitting office. Any questions or running late day of surgery: call [phone]  Questions prior to your surgery date: call [phone], Monday-Friday, 8am-4pm. If you experience any cold or flu symptoms such as cough, fever, chills, shortness of breath, etc. between now and your scheduled surgery, please notify us at the above number.     Remember:  Do not eat after midnight the night before your surgery  You may drink clear liquids until 11:00 AM the morning of your surgery.   Clear liquids allowed are: Water, Non-Citrus Juices (without pulp), Carbonated Beverages, Clear Tea (no milk, honey, etc.), Black Coffee Only (NO MILK, CREAM OR POWDERED CREAMER of any kind), and Gatorade.    Take these medicines the morning of surgery with A SIP OF WATER: amLODipine (NORVASC)  atenolol (TENORMIN)  atorvastatin (LIPITOR)  pantoprazole (PROTONIX)    May take these medicines IF NEEDED: acetaminophen (TYLENOL)    One week prior to surgery, STOP taking any Aspirin (unless otherwise instructed by your surgeon) Aleve, Naproxen, Ibuprofen, Motrin, Advil, Goody's, BC's, all herbal medications, fish oil, and non-prescription vitamins. This includes your medication: diclofenac Sodium (VOLTAREN) GEL                      Do NOT Smoke (Tobacco/Vaping) for 24 hours prior to your procedure.  If you use a CPAP at night, you may bring your mask/headgear for your overnight stay.   You will be asked to remove any contacts, glasses, piercing's, hearing aid's, dentures/partials prior to surgery. Please bring cases for these items if needed.    Patients discharged the day of surgery will not be allowed to drive home, and someone needs to stay with them for 24 hours.  SURGICAL WAITING ROOM VISITATION Patients may have no more than 2 support people in the  waiting area - these visitors may rotate.   Pre-op nurse will coordinate an appropriate time for 1 ADULT support person, who may not rotate, to accompany patient in pre-op.  Children under the age of 26 must have an adult with them who is not the patient and must remain in the main waiting area with an adult.  If the patient needs to stay at the hospital during part of their recovery, the visitor guidelines for inpatient rooms apply.  Please refer to the [facility] website for the visitor guidelines for any additional information.   If you received a COVID test during your pre-op visit  it is requested that you wear a mask when out in public, stay away from anyone that may not be feeling well and notify your surgeon if you develop symptoms. If you have been in contact with anyone that has tested positive in the last 10 days please notify you surgeon.      Pre-operative 5 CHG Bathing Instructions   You can play a key role in reducing the risk of infection after surgery. Your skin needs to be as free of germs as possible. You can reduce the number of germs on your skin by washing with CHG (chlorhexidine gluconate) soap before surgery. CHG is an antiseptic soap that kills germs and continues to kill germs even after washing.   DO NOT use if you have an allergy to chlorhexidine/CHG or antibacterial soaps. If your skin becomes reddened or irritated, stop using the CHG and notify  one of our RNs at [phone].   Please shower with the CHG soap starting 4 days before surgery using the following schedule:     Please keep in mind the following:  DO NOT shave, including legs and underarms, starting the day of your first shower.   You may shave your face at any point before/day of surgery.  Place clean sheets on your bed the day you start using CHG soap. Use a clean washcloth (not used since being washed) for each shower. DO NOT sleep with pets once you start using the CHG.   CHG Shower  Instructions:  Wash your face and private area with normal soap. If you choose to wash your hair, wash first with your normal shampoo.  After you use shampoo/soap, rinse your hair and body thoroughly to remove shampoo/soap residue.  Turn the water OFF and apply about 3 tablespoons (45 ml) of CHG soap to a CLEAN washcloth.  Apply CHG soap ONLY FROM YOUR NECK DOWN TO YOUR TOES (washing for 3-5 minutes)  DO NOT use CHG soap on face, private areas, open wounds, or sores.  Pay special attention to the area where your surgery is being performed.  If you are having back surgery, having someone wash your back for you may be helpful. Wait 2 minutes after CHG soap is applied, then you may rinse off the CHG soap.  Pat dry with a clean towel  Put on clean clothes/pajamas   If you choose to wear lotion, please use ONLY the CHG-compatible lotions on the back of this paper.   Additional instructions for the day of surgery: DO NOT APPLY any lotions, deodorants, cologne, or perfumes.   Do not bring valuables to the hospital. [facility] is not responsible for any belongings/valuables. Do not wear nail polish, gel polish, artificial nails, or any other type of covering on natural nails (fingers and toes) Do not wear jewelry or makeup Put on clean/comfortable clothes.  Please brush your teeth.  Ask your nurse before applying any prescription medications to the skin.     CHG Compatible Lotions   Aveeno Moisturizing lotion  Cetaphil Moisturizing Cream  Cetaphil Moisturizing Lotion  Clairol Herbal Essence Moisturizing Lotion, Dry Skin  Clairol Herbal Essence Moisturizing Lotion, Extra Dry Skin  Clairol Herbal Essence Moisturizing Lotion, Normal Skin  Curel Age Defying Therapeutic Moisturizing Lotion with Alpha Hydroxy  Curel Extreme Care Body Lotion  Curel Soothing Hands Moisturizing Hand Lotion  Curel Therapeutic Moisturizing Cream, Fragrance-Free  Curel Therapeutic Moisturizing Lotion,  Fragrance-Free  Curel Therapeutic Moisturizing Lotion, Original Formula  Eucerin Daily Replenishing Lotion  Eucerin Dry Skin Therapy Plus Alpha Hydroxy Crme  Eucerin Dry Skin Therapy Plus Alpha Hydroxy Lotion  Eucerin Original Crme  Eucerin Original Lotion  Eucerin Plus Crme Eucerin Plus Lotion  Eucerin TriLipid Replenishing Lotion  Keri Anti-Bacterial Hand Lotion  Keri Deep Conditioning Original Lotion Dry Skin Formula Softly Scented  Keri Deep Conditioning Original Lotion, Fragrance Free Sensitive Skin Formula  Keri Lotion Fast Absorbing Fragrance Free Sensitive Skin Formula  Keri Lotion Fast Absorbing Softly Scented Dry Skin Formula  Keri Original Lotion  Keri Skin Renewal Lotion Keri Silky Smooth Lotion  Keri Silky Smooth Sensitive Skin Lotion  Nivea Body Creamy Conditioning Oil  Nivea Body Extra Enriched Teacher, adult education Moisturizing Lotion Nivea Crme  Nivea Skin Firming Lotion  NutraDerm 30 Skin Lotion  NutraDerm Skin Lotion  NutraDerm Therapeutic Skin Cream  NutraDerm Therapeutic Skin Lotion  ProShield Protective Hand Cream  Provon moisturizing lotion  Please read over the following fact sheets that you were given.

## 2023-04-22 ENCOUNTER — Encounter (HOSPITAL_COMMUNITY): Payer: Self-pay

## 2023-04-22 ENCOUNTER — Other Ambulatory Visit: Payer: Self-pay

## 2023-04-22 ENCOUNTER — Encounter (HOSPITAL_COMMUNITY)
Admission: RE | Admit: 2023-04-22 | Discharge: 2023-04-22 | Disposition: A | Discharge status: Home / Self Care | Payer: Medicare HMO | Source: Ambulatory Visit | Attending: Neurosurgery | Admitting: Neurosurgery

## 2023-04-22 VITALS — BP 154/87 | HR 52 | Temp 98.2°F | Resp 18 | Ht 70.0 in | Wt 211.9 lb

## 2023-04-22 DIAGNOSIS — Z85828 Personal history of other malignant neoplasm of skin: Secondary | ICD-10-CM | POA: Insufficient documentation

## 2023-04-22 DIAGNOSIS — Z01812 Encounter for preprocedural laboratory examination: Secondary | ICD-10-CM | POA: Insufficient documentation

## 2023-04-22 DIAGNOSIS — I10 Essential (primary) hypertension: Secondary | ICD-10-CM | POA: Diagnosis not present

## 2023-04-22 DIAGNOSIS — Z01818 Encounter for other preprocedural examination: Secondary | ICD-10-CM

## 2023-04-22 DIAGNOSIS — M4802 Spinal stenosis, cervical region: Secondary | ICD-10-CM | POA: Insufficient documentation

## 2023-04-22 DIAGNOSIS — I4510 Unspecified right bundle-branch block: Secondary | ICD-10-CM | POA: Diagnosis not present

## 2023-04-22 HISTORY — DX: Depression, unspecified: F32.A

## 2023-04-22 HISTORY — DX: Headache, unspecified: R51.9

## 2023-04-22 HISTORY — DX: Cardiac murmur, unspecified: R01.1

## 2023-04-22 HISTORY — DX: Unspecified osteoarthritis, unspecified site: M19.90

## 2023-04-22 HISTORY — DX: Unspecified right bundle-branch block: I45.10

## 2023-04-22 LAB — SURGICAL PCR SCREEN
MRSA, PCR: NEGATIVE
Staphylococcus aureus: POSITIVE — AB

## 2023-04-22 LAB — CBC
HCT: 43.6 % (ref 39.0–52.0)
Hemoglobin: 15 g/dL (ref 13.0–17.0)
MCH: 31.3 pg (ref 26.0–34.0)
MCHC: 34.4 g/dL (ref 30.0–36.0)
MCV: 90.8 fL (ref 80.0–100.0)
Platelets: 272 10*3/uL (ref 150–400)
RBC: 4.8 MIL/uL (ref 4.22–5.81)
RDW: 12.3 % (ref 11.5–15.5)
WBC: 7 10*3/uL (ref 4.0–10.5)
nRBC: 0 % (ref 0.0–0.2)

## 2023-04-22 NOTE — Progress Notes (Signed)
PCP - Dr. Dennis Bast Cardiologist - denies  PPM/ICD - denies   Chest x-ray - 06/23/18 EKG - 01/07/23- CE- tracing requested Stress Test - 10+ years ago, in [place] per pt ECHO - 11/05/16 Cardiac Cath - mid 1990's  Sleep Study - OSA+ CPAP - denies, unable to tolerate  DM- denies  Last dose of GLP1 agonist-  n/a   ASA/Blood Thinner Instructions: n/a   ERAS Protcol - yes, no drink   COVID TEST- n/a   Anesthesia review: cardiac hx. Pt last saw Dr. Rhona Leavens, with cardiology, in [facility] on 07/21/16. Pt states he never had any reason to f/u, and was instructed to follow up as needed.   Patient denies shortness of breath, fever, cough and chest pain at PAT appointment   All instructions explained to the patient, with a verbal understanding of the material. Patient agrees to go over the instructions while at home for a better understanding.  The opportunity to ask questions was provided.

## 2023-04-23 NOTE — Anesthesia Preprocedure Evaluation (Signed)
Anesthesia Evaluation  Patient identified by MRN, date of birth, ID band Patient awake    Reviewed: Allergy & Precautions, NPO status , Patient's Chart, lab work & pertinent test results, reviewed documented beta blocker date and time   History of Anesthesia Complications Negative for: history of anesthetic complications  Airway Mallampati: II  TM Distance: >3 FB Neck ROM: Limited    Dental no notable dental hx.    Pulmonary sleep apnea , neg COPD, neg PE   breath sounds clear to auscultation       Cardiovascular hypertension, (-) CAD, (-) Past MI, (-) Cardiac Stents and (-) CABG + dysrhythmias + Valvular Problems/Murmurs  Rhythm:Regular Rate:Normal     Neuro/Psych  Headaches, neg Seizures PSYCHIATRIC DISORDERS  Depression     Neuromuscular disease    GI/Hepatic ,GERD  ,,(+) neg Cirrhosis        Endo/Other  diabetes, Type 2    Renal/GU Renal disease     Musculoskeletal  (+) Arthritis ,    Abdominal   Peds  Hematology  (+) Blood dyscrasia, anemia   Anesthesia Other Findings   Reproductive/Obstetrics                              Anesthesia Physical Anesthesia Plan  ASA: 2  Anesthesia Plan: General   Post-op Pain Management:    Induction: Intravenous  PONV Risk Score and Plan: 2 and Ondansetron and Dexamethasone  Airway Management Planned: Oral ETT and Video Laryngoscope Planned  Additional Equipment:   Intra-op Plan:   Post-operative Plan: Extubation in OR  Informed Consent: I have reviewed the patients History and Physical, chart, labs and discussed the procedure including the risks, benefits and alternatives for the proposed anesthesia with the patient or authorized representative who has indicated his/her understanding and acceptance.     Dental advisory given  Plan Discussed with: CRNA  Anesthesia Plan Comments: (PAT note written 04/23/2023 by Shonna Chock,  PA-C.  )        Anesthesia Quick Evaluation

## 2023-04-23 NOTE — Progress Notes (Signed)
Anesthesia Chart Review:  Case: [id number] Date/Time: 04/26/23 1348   Procedure: ACDF - C4-C5 - 3C   Anesthesia type: General   Pre-op diagnosis: Stenosis   [facility]: [facility] ROOM 20 / [facility]   Surgeons: Julio Sicks, MD       DISCUSSION: Patient is a 77 year old Morgan scheduled for the above procedure.  History includes never smoker, HTN, hypercholesterolemia, murmur (I/VI SEM 2018), right bundle branch block, GERD, BPH, skin cancer (BCC left cheek), spinal surgery (T10-T11 decompressive laminectomy 10/15/22 for myelopathy).    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 S/p decompressive laminectomy on 10/15/22. Considered for CIR but ultimately discharged with HHPT/OT. Readmitted 10/29/22 for difficulty with mobility at home and transferred to [facility] for rehab therapies. Discharged 11/06/22.   04/22/23 CBC normal. BMP hemolyzed; however, he had recent labs through the [facility] on 03/31/23. Results include: H/H 15.1/45.5, PLT 263, BUN 13, Cr 0.75, glucose 106, Na 138, K 4.3, Ca 9.6, AST 20, ALT 17, A1c 5.9% on 03/31/23 ([facility]).  01/07/23 EKG and 2018 echo report required from [facility].  Anesthesia team to evaluate on the day of surgery.   VS: BP (!) 154/Mitchell   Pulse (!) 52   Temp 36.8 C   Resp 18   Ht 5\' 10"  (1.778 m)   Wt 96.1 kg   SpO2 96%   BMI 30.40 kg/m    PROVIDERS: Andreas Blower., MD is PCP    LABS: CBC normal on 04/22/23. H/H 15.1/45.5, PLT 263, BUN 13, Cr 0.75, glucose 106, Na 138, K 4.3, Ca 9.6, AST 20, ALT 17, A1c 5.9% on 03/31/23 [facility]).  Labs Reviewed  SURGICAL PCR SCREEN - Abnormal; Notable for the following components:      Result Value   Staphylococcus aureus POSITIVE (*)    All other components within normal limits  CBC    IMAGES: MRI T-spine 03/12/23 ([facility]): IMPRESSION:  1. Spinal curvature and mildly increased kyphosis.  2.  Surgical changes again seen at  T10-T11 left-sided anterior extradural abnormality, not significantly changed. See comments above.  3.  Minimal cord signal abnormality at T10-T11 again noted.  4.  Mild central and foraminal stenosis at T10-T11, T11-T12 and T12-L1, as well is in the upper thoracic spine. Some foraminal narrowing in the upper, and lower thoracic spine, partly on a scoliotic basis, greatest left at T9-T10 through T12-L1.  5.  Other findings include thyroid heterogeneity, enlarged ascending aorta. Cardiomegaly with possible left ventricular hypertrophy.. Left renal cyst.   CT C-spine 03/08/23 ([facility]): IMPRESSION: Multilevel spondylosis. No acute injury.   EKG: EKG 01/07/23 ([facility]): Tracing requested. Per Result Narrative in CE:  Sinus rhythm  Left axis deviation  Right bundle branch block  Anterolateral infarct , age undetermined  No previous ECGs available  Confirmed by Jason Coop  [id number]  on 01-08-2023 1 13 02 PM  - Prior EKG on 02/14/20 showed NSR, LAD, RBBB, anterolateral infarct, age underdetermined.   CV:  Echo 11/05/16 [facility]): Report not viewable in [facility]. Evaluated by Caryl Ada, MD for I/VI SEM, likely innocent murmur. Will attempt to get report.   Reported remote history of a stress test greater than 10 years ago.  Reported a prior cardiac cath in the 1990s.  Past Medical History:  Diagnosis Date   Arthritis    spine   Basal cell carcinoma    left cheek   BPH (  benign prostatic hyperplasia)    Depression    pt lost his son in 2023   Diverticulosis    GERD (gastroesophageal reflux disease)    Headache    Heart murmur    pt has had ECHO 11/05/16- [facility]   High cholesterol    Hip pain, bilateral    left > right   Hypertension    Insomnia    Knee pain, bilateral    Prediabetes    RBBB    Rotator cuff disorder, left     Past Surgical History:  Procedure Laterality Date   BASAL CELL CARCINOMA EXCISION     left cheek   DECOMPRESSIVE LUMBAR LAMINECTOMY LEVEL 1  N/A 10/15/2022   Procedure: DECOMPRESSIVE thoracic laminectomy THORACIC TEN -ELEVEN;  Surgeon: Julio Sicks, MD;  Location: [facility] OR;  Service: Neurosurgery;  Laterality: N/A;    MEDICATIONS:  acetaminophen (TYLENOL) 500 MG tablet   amLODipine (NORVASC) 10 MG tablet   atenolol (TENORMIN) 50 MG tablet   atorvastatin (LIPITOR) 10 MG tablet   cholecalciferol (VITAMIN D3) 25 MCG (1000 UNIT) tablet   diclofenac Sodium (VOLTAREN) 1 % GEL   pantoprazole (PROTONIX) 40 MG tablet   polyethylene glycol powder (GLYCOLAX/MIRALAX) 17 GM/SCOOP powder   PROLIA 60 MG/ML SOSY injection   senna (SENOKOT) 8.6 MG tablet   zolpidem (AMBIEN) 10 MG tablet   No current facility-administered medications for this encounter.    Shonna Chock, PA-C Surgical Short Stay/Anesthesiology [facility] Phone [phone] [facility] Phone [phone] 04/23/2023 11:30 AM

## 2023-04-26 ENCOUNTER — Ambulatory Visit (HOSPITAL_COMMUNITY): Payer: Medicare HMO

## 2023-04-26 ENCOUNTER — Ambulatory Visit (HOSPITAL_COMMUNITY)
Admission: RE | Disposition: A | Discharge status: Home / Self Care | Payer: Self-pay | Source: Home / Self Care | Attending: Neurosurgery

## 2023-04-26 ENCOUNTER — Observation Stay (HOSPITAL_COMMUNITY)
Admission: RE | Admit: 2023-04-26 | Discharge: 2023-04-27 | Disposition: A | Discharge status: Home / Self Care | Payer: Medicare HMO | Attending: Neurosurgery | Admitting: Neurosurgery

## 2023-04-26 ENCOUNTER — Other Ambulatory Visit: Payer: Self-pay

## 2023-04-26 ENCOUNTER — Ambulatory Visit (HOSPITAL_COMMUNITY): Payer: Self-pay | Admitting: Vascular Surgery

## 2023-04-26 ENCOUNTER — Encounter (HOSPITAL_COMMUNITY): Payer: Self-pay | Admitting: Neurosurgery

## 2023-04-26 ENCOUNTER — Ambulatory Visit (HOSPITAL_BASED_OUTPATIENT_CLINIC_OR_DEPARTMENT_OTHER): Payer: Medicare HMO | Admitting: Anesthesiology

## 2023-04-26 DIAGNOSIS — M4722 Other spondylosis with radiculopathy, cervical region: Principal | ICD-10-CM | POA: Insufficient documentation

## 2023-04-26 DIAGNOSIS — Z85828 Personal history of other malignant neoplasm of skin: Secondary | ICD-10-CM | POA: Insufficient documentation

## 2023-04-26 DIAGNOSIS — M5412 Radiculopathy, cervical region: Secondary | ICD-10-CM | POA: Diagnosis not present

## 2023-04-26 DIAGNOSIS — M4712 Other spondylosis with myelopathy, cervical region: Secondary | ICD-10-CM | POA: Diagnosis not present

## 2023-04-26 DIAGNOSIS — I10 Essential (primary) hypertension: Secondary | ICD-10-CM | POA: Diagnosis not present

## 2023-04-26 DIAGNOSIS — M4802 Spinal stenosis, cervical region: Secondary | ICD-10-CM | POA: Diagnosis present

## 2023-04-26 DIAGNOSIS — Z01818 Encounter for other preprocedural examination: Secondary | ICD-10-CM

## 2023-04-26 DIAGNOSIS — Z79899 Other long term (current) drug therapy: Secondary | ICD-10-CM | POA: Diagnosis not present

## 2023-04-26 HISTORY — PX: ANTERIOR CERVICAL DECOMP/DISCECTOMY FUSION: SHX1161

## 2023-04-26 LAB — BASIC METABOLIC PANEL
Anion gap: 8 (ref 5–15)
BUN: 9 mg/dL (ref 8–23)
CO2: 26 mmol/L (ref 22–32)
Calcium: 9.2 mg/dL (ref 8.9–10.3)
Chloride: 101 mmol/L (ref 98–111)
Creatinine, Ser: 0.97 mg/dL (ref 0.61–1.24)
GFR, Estimated: >60 mL/min (ref >60)
Glucose, Bld: 106 mg/dL — ABNORMAL HIGH (ref 70–99)
Potassium: 4 mmol/L (ref 3.5–5.1)
Sodium: 135 mmol/L (ref 135–145)

## 2023-04-26 SURGERY — ANTERIOR CERVICAL DECOMPRESSION/DISCECTOMY FUSION 1 LEVEL
Anesthesia: General

## 2023-04-26 MED ORDER — VITAMIN D 25 MCG (1000 UNIT) PO TABS
1000.0000 [IU] | ORAL_TABLET | Freq: Every day | ORAL | Status: DC
  Administered 2023-04-27: 1000 [IU] via ORAL
  Filled 2023-04-26: qty 1

## 2023-04-26 MED ORDER — CHLORHEXIDINE GLUCONATE 0.12 % MT SOLN
OROMUCOSAL | Status: AC
  Administered 2023-04-26: 15 mL via OROMUCOSAL
  Filled 2023-04-26: qty 15

## 2023-04-26 MED ORDER — SENNA 8.6 MG PO TABS
2.0000 | ORAL_TABLET | Freq: Every day | ORAL | Status: DC | PRN
  Administered 2023-04-26: 17.2 mg via ORAL
  Filled 2023-04-26: qty 2

## 2023-04-26 MED ORDER — ROCURONIUM BROMIDE 10 MG/ML (PF) SYRINGE
PREFILLED_SYRINGE | INTRAVENOUS | Status: DC | PRN
  Administered 2023-04-26: 10 mg via INTRAVENOUS
  Administered 2023-04-26: 70 mg via INTRAVENOUS

## 2023-04-26 MED ORDER — PANTOPRAZOLE SODIUM 40 MG PO TBEC
40.0000 mg | DELAYED_RELEASE_TABLET | Freq: Every day | ORAL | Status: DC
  Administered 2023-04-27: 40 mg via ORAL
  Filled 2023-04-26: qty 1

## 2023-04-26 MED ORDER — ACETAMINOPHEN 10 MG/ML IV SOLN: 1000.0000 mg | Freq: Once | INTRAVENOUS | Status: DC | PRN

## 2023-04-26 MED ORDER — LACTATED RINGERS IV SOLN
INTRAVENOUS | Status: DC
Start: 2023-04-26 — End: 2023-04-26

## 2023-04-26 MED ORDER — EPHEDRINE SULFATE-NACL 50-0.9 MG/10ML-% IV SOSY
PREFILLED_SYRINGE | INTRAVENOUS | Status: DC | PRN
  Administered 2023-04-26 (×4): 10 mg via INTRAVENOUS

## 2023-04-26 MED ORDER — ZOLPIDEM TARTRATE 5 MG PO TABS
10.0000 mg | ORAL_TABLET | Freq: Every day | ORAL | Status: DC
Start: 2023-04-26 — End: 2023-04-27
  Administered 2023-04-26: 10 mg via ORAL
  Filled 2023-04-26: qty 2

## 2023-04-26 MED ORDER — ATORVASTATIN CALCIUM 10 MG PO TABS
10.0000 mg | ORAL_TABLET | Freq: Every day | ORAL | Status: DC
  Administered 2023-04-27: 10 mg via ORAL
  Filled 2023-04-26: qty 1

## 2023-04-26 MED ORDER — FENTANYL CITRATE (PF) 100 MCG/2ML IJ SOLN
25.0000 ug | INTRAMUSCULAR | Status: DC | PRN
  Administered 2023-04-26: 50 ug via INTRAVENOUS

## 2023-04-26 MED ORDER — HYDROCODONE-ACETAMINOPHEN 10-325 MG PO TABS: 2.0000 | ORAL_TABLET | ORAL | Status: DC | PRN

## 2023-04-26 MED ORDER — LIDOCAINE 2% (20 MG/ML) 5 ML SYRINGE
INTRAMUSCULAR | Status: DC | PRN
  Administered 2023-04-26: 90 mg via INTRAVENOUS

## 2023-04-26 MED ORDER — CHLORHEXIDINE GLUCONATE 0.12 % MT SOLN: 15.0000 mL | Freq: Once | OROMUCOSAL | Status: AC

## 2023-04-26 MED ORDER — PROPOFOL 10 MG/ML IV BOLUS
INTRAVENOUS | Status: DC | PRN
  Administered 2023-04-26: 30 mg via INTRAVENOUS
  Administered 2023-04-26: 100 mg via INTRAVENOUS

## 2023-04-26 MED ORDER — HYDROCODONE-ACETAMINOPHEN 10-325 MG PO TABS: 1.0000 | ORAL_TABLET | ORAL | Status: DC | PRN

## 2023-04-26 MED ORDER — SODIUM CHLORIDE 0.9% FLUSH: 3.0000 mL | INTRAVENOUS | Status: DC | PRN

## 2023-04-26 MED ORDER — ATENOLOL 50 MG PO TABS
50.0000 mg | ORAL_TABLET | Freq: Every day | ORAL | Status: DC
Start: 2023-04-27 — End: 2023-04-27
  Administered 2023-04-27: 50 mg via ORAL
  Filled 2023-04-26: qty 1

## 2023-04-26 MED ORDER — ACETAMINOPHEN 650 MG RE SUPP: 650.0000 mg | RECTAL | Status: DC | PRN

## 2023-04-26 MED ORDER — ONDANSETRON HCL 4 MG/2ML IJ SOLN: 4.0000 mg | Freq: Once | INTRAMUSCULAR | Status: DC | PRN

## 2023-04-26 MED ORDER — ONDANSETRON HCL 4 MG/2ML IJ SOLN
INTRAMUSCULAR | Status: DC | PRN
  Administered 2023-04-26: 4 mg via INTRAVENOUS

## 2023-04-26 MED ORDER — MIDAZOLAM HCL 2 MG/2ML IJ SOLN
INTRAMUSCULAR | Status: DC | PRN
  Administered 2023-04-26: 1 mg via INTRAVENOUS

## 2023-04-26 MED ORDER — MENTHOL 3 MG MT LOZG: 1.0000 | LOZENGE | OROMUCOSAL | Status: DC | PRN

## 2023-04-26 MED ORDER — THROMBIN (RECOMBINANT) 5000 UNITS EX SOLR
CUTANEOUS | Status: DC | PRN
  Administered 2023-04-26: 10 mL via TOPICAL

## 2023-04-26 MED ORDER — 0.9 % SODIUM CHLORIDE (POUR BTL) OPTIME
TOPICAL | Status: DC | PRN
  Administered 2023-04-26: 1000 mL

## 2023-04-26 MED ORDER — CEFAZOLIN SODIUM-DEXTROSE 1-4 GM/50ML-% IV SOLN
1.0000 g | Freq: Three times a day (TID) | INTRAVENOUS | Status: AC
  Administered 2023-04-26 – 2023-04-27 (×2): 1 g via INTRAVENOUS
  Filled 2023-04-26 (×2): qty 50

## 2023-04-26 MED ORDER — CEFAZOLIN SODIUM-DEXTROSE 2-4 GM/100ML-% IV SOLN
2.0000 g | INTRAVENOUS | Status: AC
  Administered 2023-04-26: 2 g via INTRAVENOUS

## 2023-04-26 MED ORDER — CYCLOBENZAPRINE HCL 10 MG PO TABS: 10.0000 mg | ORAL_TABLET | Freq: Three times a day (TID) | ORAL | Status: DC | PRN

## 2023-04-26 MED ORDER — DEXAMETHASONE SODIUM PHOSPHATE 10 MG/ML IJ SOLN
INTRAMUSCULAR | Status: DC | PRN
  Administered 2023-04-26: 10 mg via INTRAVENOUS

## 2023-04-26 MED ORDER — SODIUM CHLORIDE 0.9 % IV SOLN
250.0000 mL | INTRAVENOUS | Status: DC
  Administered 2023-04-26: 250 mL via INTRAVENOUS

## 2023-04-26 MED ORDER — HYDROMORPHONE HCL 1 MG/ML IJ SOLN
1.0000 mg | INTRAMUSCULAR | Status: DC | PRN
  Administered 2023-04-26: 1 mg via INTRAVENOUS
  Filled 2023-04-26: qty 1

## 2023-04-26 MED ORDER — MUPIROCIN 2 % EX OINT
TOPICAL_OINTMENT | CUTANEOUS | Status: AC
  Filled 2023-04-26: qty 22

## 2023-04-26 MED ORDER — SODIUM CHLORIDE 0.9% FLUSH
3.0000 mL | Freq: Two times a day (BID) | INTRAVENOUS | Status: DC
  Administered 2023-04-26: 3 mL via INTRAVENOUS

## 2023-04-26 MED ORDER — CHLORHEXIDINE GLUCONATE CLOTH 2 % EX PADS
6.0000 | MEDICATED_PAD | Freq: Once | CUTANEOUS | Status: DC
Start: 2023-04-26 — End: 2023-04-26

## 2023-04-26 MED ORDER — CHLORHEXIDINE GLUCONATE CLOTH 2 % EX PADS: 6.0000 | MEDICATED_PAD | Freq: Once | CUTANEOUS | Status: DC

## 2023-04-26 MED ORDER — OXYCODONE HCL 5 MG PO TABS: 5.0000 mg | ORAL_TABLET | Freq: Once | ORAL | Status: DC | PRN

## 2023-04-26 MED ORDER — THROMBIN 5000 UNITS EX SOLR
CUTANEOUS | Status: AC
  Filled 2023-04-26: qty 10000

## 2023-04-26 MED ORDER — DOCUSATE SODIUM 100 MG PO CAPS
100.0000 mg | ORAL_CAPSULE | Freq: Two times a day (BID) | ORAL | Status: DC | PRN
  Administered 2023-04-26: 100 mg via ORAL
  Filled 2023-04-26: qty 1

## 2023-04-26 MED ORDER — PROPOFOL 1000 MG/100ML IV EMUL
INTRAVENOUS | Status: AC
  Filled 2023-04-26: qty 100

## 2023-04-26 MED ORDER — CEFAZOLIN SODIUM-DEXTROSE 2-4 GM/100ML-% IV SOLN
INTRAVENOUS | Status: AC
  Filled 2023-04-26: qty 100

## 2023-04-26 MED ORDER — PHENOL 1.4 % MT LIQD: 1.0000 | OROMUCOSAL | Status: DC | PRN

## 2023-04-26 MED ORDER — PHENYLEPHRINE HCL-NACL 20-0.9 MG/250ML-% IV SOLN
INTRAVENOUS | Status: AC
  Filled 2023-04-26: qty 250

## 2023-04-26 MED ORDER — PHENYLEPHRINE HCL-NACL 20-0.9 MG/250ML-% IV SOLN
INTRAVENOUS | Status: DC | PRN
  Administered 2023-04-26: 25 ug/min via INTRAVENOUS

## 2023-04-26 MED ORDER — FENTANYL CITRATE (PF) 100 MCG/2ML IJ SOLN
INTRAMUSCULAR | Status: AC
  Filled 2023-04-26: qty 2

## 2023-04-26 MED ORDER — ONDANSETRON HCL 4 MG/2ML IJ SOLN
4.0000 mg | Freq: Four times a day (QID) | INTRAMUSCULAR | Status: DC | PRN
Start: 2023-04-26 — End: 2023-04-27

## 2023-04-26 MED ORDER — ACETAMINOPHEN 325 MG PO TABS: 650.0000 mg | ORAL_TABLET | ORAL | Status: DC | PRN

## 2023-04-26 MED ORDER — ONDANSETRON HCL 4 MG PO TABS: 4.0000 mg | ORAL_TABLET | Freq: Four times a day (QID) | ORAL | Status: DC | PRN

## 2023-04-26 MED ORDER — ORAL CARE MOUTH RINSE: 15.0000 mL | Freq: Once | OROMUCOSAL | Status: AC

## 2023-04-26 MED ORDER — HYDROCODONE-ACETAMINOPHEN 5-325 MG PO TABS: 1.0000 | ORAL_TABLET | ORAL | Status: DC | PRN

## 2023-04-26 MED ORDER — OXYCODONE HCL 5 MG/5ML PO SOLN
5.0000 mg | Freq: Once | ORAL | Status: DC | PRN
Start: 2023-04-26 — End: 2023-04-26

## 2023-04-26 MED ORDER — AMLODIPINE BESYLATE 10 MG PO TABS
10.0000 mg | ORAL_TABLET | Freq: Every day | ORAL | Status: DC
Start: 2023-04-27 — End: 2023-04-27
  Administered 2023-04-27: 10 mg via ORAL
  Filled 2023-04-26: qty 1

## 2023-04-26 MED ORDER — MIDAZOLAM HCL 2 MG/2ML IJ SOLN
INTRAMUSCULAR | Status: AC
  Filled 2023-04-26: qty 2

## 2023-04-26 MED ORDER — FENTANYL CITRATE (PF) 250 MCG/5ML IJ SOLN
INTRAMUSCULAR | Status: DC | PRN
  Administered 2023-04-26: 100 ug via INTRAVENOUS
  Administered 2023-04-26: 50 ug via INTRAVENOUS

## 2023-04-26 MED ORDER — SUGAMMADEX SODIUM 200 MG/2ML IV SOLN
INTRAVENOUS | Status: DC | PRN
  Administered 2023-04-26 (×2): 100 mg via INTRAVENOUS

## 2023-04-26 MED ORDER — FENTANYL CITRATE (PF) 250 MCG/5ML IJ SOLN
INTRAMUSCULAR | Status: AC
  Filled 2023-04-26: qty 5

## 2023-04-26 SURGICAL SUPPLY — 43 items
BAG COUNTER SPONGE SURGICOUNT (BAG) ×1 IMPLANT
BENZOIN TINCTURE PRP APPL 2/3 (GAUZE/BANDAGES/DRESSINGS) ×1 IMPLANT
BIT DRILL 13 (BIT) IMPLANT
BUR MATCHSTICK NEURO 3.0 LAGG (BURR) ×1 IMPLANT
CANISTER SUCT 3000ML PPV (MISCELLANEOUS) ×1 IMPLANT
DRAPE C-ARM 42X72 X-RAY (DRAPES) ×2 IMPLANT
DRAPE LAPAROTOMY 100X72 PEDS (DRAPES) ×1 IMPLANT
DRAPE MICROSCOPE SLANT 54X150 (MISCELLANEOUS) ×1 IMPLANT
DURAPREP 6ML APPLICATOR 50/CS (WOUND CARE) ×1 IMPLANT
ELECT COATED BLADE 2.86 ST (ELECTRODE) ×1 IMPLANT
ELECT REM PT RETURN 9FT ADLT (ELECTROSURGICAL) ×1
ELECTRODE REM PT RTRN 9FT ADLT (ELECTROSURGICAL) ×1 IMPLANT
GAUZE 4X4 16PLY ~~LOC~~+RFID DBL (SPONGE) IMPLANT
GAUZE SPONGE 4X4 12PLY STRL (GAUZE/BANDAGES/DRESSINGS) ×1 IMPLANT
GAUZE SPONGE 4X4 12PLY STRL LF (GAUZE/BANDAGES/DRESSINGS) IMPLANT
GLOVE ECLIPSE 9.0 STRL (GLOVE) ×1 IMPLANT
GLOVE EXAM NITRILE XL STR (GLOVE) IMPLANT
GOWN STRL REUS W/ TWL LRG LVL3 (GOWN DISPOSABLE) IMPLANT
GOWN STRL REUS W/ TWL XL LVL3 (GOWN DISPOSABLE) IMPLANT
GOWN STRL REUS W/TWL 2XL LVL3 (GOWN DISPOSABLE) IMPLANT
HALTER HD/CHIN CERV TRACTION D (MISCELLANEOUS) ×1 IMPLANT
HEMOSTAT POWDER KIT SURGIFOAM (HEMOSTASIS) IMPLANT
KIT BASIN OR (CUSTOM PROCEDURE TRAY) ×1 IMPLANT
KIT TURNOVER KIT B (KITS) ×1 IMPLANT
NDL SPNL 20GX3.5 QUINCKE YW (NEEDLE) ×1 IMPLANT
NEEDLE SPNL 20GX3.5 QUINCKE YW (NEEDLE) ×1 IMPLANT
NS IRRIG 1000ML POUR BTL (IV SOLUTION) ×1 IMPLANT
PACK LAMINECTOMY NEURO (CUSTOM PROCEDURE TRAY) ×1 IMPLANT
PAD ARMBOARD 7.5X6 YLW CONV (MISCELLANEOUS) ×3 IMPLANT
PLATE 23MM (Plate) IMPLANT
SCREW ST 13X4XST VA NS SPNE (Screw) IMPLANT
SPACER BONE CORNERSTONE 6X14 (Orthopedic Implant) IMPLANT
SPONGE INTESTINAL PEANUT (DISPOSABLE) ×1 IMPLANT
SPONGE SURGIFOAM ABS GEL SZ50 (HEMOSTASIS) ×1 IMPLANT
STRIP CLOSURE SKIN 1/2X4 (GAUZE/BANDAGES/DRESSINGS) ×1 IMPLANT
SUT VIC AB 3-0 SH 8-18 (SUTURE) ×1 IMPLANT
SUT VIC AB 4-0 RB1 18 (SUTURE) ×1 IMPLANT
TAPE CLOTH 4X10 WHT NS (GAUZE/BANDAGES/DRESSINGS) ×1 IMPLANT
TAPE CLOTH SURG 4X10 WHT LF (GAUZE/BANDAGES/DRESSINGS) IMPLANT
TOWEL GREEN STERILE (TOWEL DISPOSABLE) ×1 IMPLANT
TOWEL GREEN STERILE FF (TOWEL DISPOSABLE) ×1 IMPLANT
TRAP SPECIMEN MUCUS 40CC (MISCELLANEOUS) ×1 IMPLANT
WATER STERILE IRR 1000ML POUR (IV SOLUTION) ×1 IMPLANT

## 2023-04-26 NOTE — Brief Op Note (Signed)
04/26/2023  3:43 PM  PATIENT:  Mitchell Morgan  77 y.o. male  PRE-OPERATIVE DIAGNOSIS:  Stenosis  POST-OPERATIVE DIAGNOSIS:  Stenosis  PROCEDURE:  Procedure(s) with comments: Anterior Cervical Decompression/Discectomy Fusion - Cervical Four-Cervical Five (N/A) - 3C  SURGEON:  Surgeons and Role:    Julio Sicks, MD - Primary  PHYSICIAN ASSISTANT:   ASSISTANTSMarland Mcalpine   ANESTHESIA:   general  EBL:  50 mL   BLOOD ADMINISTERED:none  DRAINS: none   LOCAL MEDICATIONS USED:  NONE  SPECIMEN:  No Specimen  DISPOSITION OF SPECIMEN:  N/A  COUNTS:  YES  TOURNIQUET:  * No tourniquets in log *  DICTATION: .Dragon Dictation  PLAN OF CARE: Admit for overnight observation  PATIENT DISPOSITION:  PACU - hemodynamically stable.   Delay start of Pharmacological VTE agent (>24hrs) due to surgical blood loss or risk of bleeding: yes

## 2023-04-26 NOTE — Anesthesia Procedure Notes (Signed)
Procedure Name: Intubation Date/Time: 04/26/2023 2:53 PM  Performed by: Wilder Glade, CRNAPre-anesthesia Checklist: Patient identified, Emergency Drugs available, Patient being monitored, Suction available and Timeout performed Patient Re-evaluated:Patient Re-evaluated prior to induction Oxygen Delivery Method: Circle system utilized Preoxygenation: Pre-oxygenation with 100% oxygen Induction Type: IV induction Ventilation: Mask ventilation without difficulty Laryngoscope Size: Mac, Glidescope and 3 Grade View: Grade I Tube type: Oral Tube size: 7.5 mm Number of attempts: 1 Airway Equipment and Method: Stylet and Video-laryngoscopy Placement Confirmation: ETT inserted through vocal cords under direct vision, positive ETCO2 and breath sounds checked- equal and bilateral Secured at: 24 cm Tube secured with: Tape Dental Injury: Teeth and Oropharynx as per pre-operative assessment  Comments: Brief atraumatic smooth induction dvl x1 carefully placed ETT vc open visualized throughout lubricated end of ETT with KY Jelly easy passage Grade 1 view dentition unchanged

## 2023-04-26 NOTE — Plan of Care (Signed)

## 2023-04-26 NOTE — Anesthesia Postprocedure Evaluation (Signed)
Anesthesia Post Note  Patient: Mitchell Morgan  Procedure(s) Performed: Anterior Cervical Decompression/Discectomy Fusion - Cervical Four-Cervical Five     Patient location during evaluation: PACU Anesthesia Type: General Level of consciousness: awake and alert Pain management: pain level controlled Vital Signs Assessment: post-procedure vital signs reviewed and stable Respiratory status: spontaneous breathing, nonlabored ventilation, respiratory function stable and patient connected to nasal cannula oxygen Cardiovascular status: blood pressure returned to baseline and stable Postop Assessment: no apparent nausea or vomiting Anesthetic complications: no   No notable events documented.  Last Vitals:  Vitals:   04/26/23 1645 04/26/23 1719  BP: (!) 156/77 (!) 162/83  Pulse: 66 (!) 59  Resp: 20 18  Temp: 36.7 C 36.5 C  SpO2: 98% 98%    Last Pain:  Vitals:   04/26/23 1719  TempSrc: Oral  PainSc:                  Mariann Barter

## 2023-04-26 NOTE — Op Note (Signed)
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  Asst. Surgeon: Doran Durand, NP  Anesthesia: General  Indication: 77 year old male with bilateral extremity numbness paresthesias and weakness.  Patient also with history of prior lower thoracic myelopathy and has continued gait instability and some urinary and fecal incontinence which are possibly also related to his severe cervical stenosis at C4-5 where he has marked cord compression and signal abnormality.  Patient presents now for C4-5 anterior cervical discectomy and fusion in hopes of improving his symptoms.  Operative note: After induction of anesthesia, patient positioned supine with neck slightly extended and held placeholder traction.  Patient's anterior cervical region prepped and draped sterilely.  Incision made overlying C4-5.  Dissection performed on the right.  Retractor placed.  Fluoroscopy used.  Level confirmed.  Disc base at C4-5 was incised with a 15 blade.  Anterior osteophytes and disc material were resected.  Discectomy then performed using various instruments down to level of the posterior annulus.  Microscope was then brought into the field used throughout the remainder of the discectomy.  Remaining aspects of annulus and osteophytes were removed using high-speed drill down to level the posterior logical ligament.  Posterior logical was then elevated and resected in a piecemeal fashion.  Underlying thecal sac was then identified.  A wide central decompression then performed undercutting the bodies of C4 and C5.  Decompression then proceeded each neural foramina.  Wide anterior foraminotomies performed on course the exiting C5 nerve roots  bilaterally.  At this point a very thorough decompression of been achieved.  There was no evidence of injury to the thecal sac or nerve roots.  Wound was then irrigated.  Hemostasis was achieved with bipolar cautery and Gelfoam sponges.  Gelfoam was removed.  6 mm allograft wedge was then impacted in place and recessed slightly from the anterior cortical margin.  Atlantis anterior cervical plate was then placed through the C3-4 and C5 levels.  This then attached under fluoroscopic guidance using 13 mm variable angle screws to each at both levels.  All 4 screws given final tightening.  Locking screws engaged both levels.  Final images reveal good position of the cage and the hardware with proper operative level with normal alignment of spine.  Wound was then irrigated.  Hemostasis was confirmed.  Wound is then closed in layers with Vicryl sutures.  Steri-Strips and sterile dressing were applied.  No apparent complications.  Patient tolerated the procedure well and he returns to the recovery room postop.

## 2023-04-26 NOTE — H&P (Signed)
Mitchell Morgan is an 77 y.o. male.   Chief Complaint: Weakness HPI: 77 year old male with progressive bilateral upper extremity numbness and weakness.  Patient with history of thoracic myelopathy status post decompressive surgery.  Workup demonstrates evidence of significant degenerative anterior listhesis with stenosis at C4-5 with cord signal change.  Patient presents now for C4-5 anterior cervical discectomy and fusion in hopes of improving his symptoms.  Past Medical History:  Diagnosis Date   Arthritis    spine   Basal cell carcinoma    left cheek   BPH (benign prostatic hyperplasia)    Depression    pt lost his son in 2023   Diverticulosis    GERD (gastroesophageal reflux disease)    Headache    Heart murmur    pt has had ECHO 11/05/16- [facility]   High cholesterol    Hip pain, bilateral    left > right   Hypertension    Insomnia    Knee pain, bilateral    Prediabetes    RBBB    Rotator cuff disorder, left     Past Surgical History:  Procedure Laterality Date   BASAL CELL CARCINOMA EXCISION     left cheek   DECOMPRESSIVE LUMBAR LAMINECTOMY LEVEL 1 N/A 10/15/2022   Procedure: DECOMPRESSIVE thoracic laminectomy THORACIC TEN -ELEVEN;  Surgeon: Julio Sicks, MD;  Location: [facility] OR;  Service: Neurosurgery;  Laterality: N/A;    Family History  Problem Relation Age of Onset   Hypertension Mother    Kidney failure Mother    Heart disease Father    Social History:  reports that he has never smoked. He has never used smokeless tobacco. He reports that he does not drink alcohol and does not use drugs.  Allergies:  Allergies  Allergen Reactions   Iodine Itching and Swelling    Red Eyes, Watery    Pravastatin Other (See Comments)    myalgia   Shrimp [Shellfish Allergy] Itching and Swelling    Lobster, crab  Swollen eyes   Simvastatin Other (See Comments)    myalgia    Medications Prior to Admission  Medication Sig Dispense Refill   acetaminophen (TYLENOL) 500 MG tablet  Take 500-1,000 mg by mouth every 6 (six) hours as needed for moderate pain (pain score 4-6).     amLODipine (NORVASC) 10 MG tablet Take 10 mg by mouth daily.     atenolol (TENORMIN) 50 MG tablet Take 50 mg by mouth daily.     atorvastatin (LIPITOR) 10 MG tablet Take 10 mg by mouth daily.     cholecalciferol (VITAMIN D3) 25 MCG (1000 UNIT) tablet Take 1,000 Units by mouth daily.     diclofenac Sodium (VOLTAREN) 1 % GEL Apply 2 g topically in the morning, at noon, and at bedtime. (Patient taking differently: Apply 2 g topically 3 (three) times daily as needed (pain).) 3500 g 0   pantoprazole (PROTONIX) 40 MG tablet Take 1 tablet (40 mg total) by mouth 2 (two) times daily. (Patient taking differently: Take 40 mg by mouth daily.) 60 tablet 0   polyethylene glycol powder (GLYCOLAX/MIRALAX) 17 GM/SCOOP powder Take 1 capful (17 g) by mouth daily. (Patient taking differently: Take 17 g by mouth daily as needed for moderate constipation.) 510 g 0   PROLIA 60 MG/ML SOSY injection Inject 60 mg into the skin every 6 (six) months.     senna (SENOKOT) 8.6 MG tablet Take 2 tablets by mouth daily as needed for constipation.     zolpidem (AMBIEN)  10 MG tablet Take 10 mg by mouth at bedtime.      No results found for this or any previous visit (from the past 48 hour(s)). No results found.  Pertinent items noted in HPI and remainder of comprehensive ROS otherwise negative.  Blood pressure (!) 150/81, pulse (!) 55, temperature 98.1 F (36.7 C), temperature source Oral, resp. rate 17, height 5\' 10"  (1.778 m), weight 94.3 kg, SpO2 96%.  Patient is awake and alert.  He is oriented and appropriate.  Speech is fluent.  Judgment insight are intact.  Cranial nerve function normal by.  Motor examination reveals moderate weakness of both hands and grip function otherwise motor strength intact in his upper extremities.  Lower extremity function with chronic 4+/5 weakness in both lower extremities.  Sensory examination with  decrease sensation in both distal upper extremities and both lower extremities.  Reflexes are brisk.  No evidence of long track signs.  Gait is unsteady.  Examination head ears eyes nose and throat is unremarked.  Chest and abdomen are benign.  Extremities are free from injury deformity. 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  Plan C4-5 anterior cervical discectomy and fusion with interbody allograft and anterior plate instrumentation.  Risks and benefits been explained.  Patient wishes to proceed.  Sherilyn Cooter A Bryam Taborda 04/26/2023, 2:02 PM

## 2023-04-26 NOTE — Transfer of Care (Signed)
Immediate Anesthesia Transfer of Care Note  Patient: Mitchell Morgan  Procedure(s) Performed: Anterior Cervical Decompression/Discectomy Fusion - Cervical Four-Cervical Five  Patient Location: PACU  Anesthesia Type:General  Level of Consciousness: awake, alert , oriented, and patient cooperative  Airway & Oxygen Therapy: Patient Spontanous Breathing and Patient connected to face mask oxygen  Post-op Assessment: Report given to RN and Post -op Vital signs reviewed and stable  Post vital signs: Reviewed and stable  Last Vitals:  Vitals Value Taken Time  BP 145/72 04/26/23 1600  Temp    Pulse 55 04/26/23 1602  Resp 15 04/26/23 1602  SpO2 95 % 04/26/23 1602  Vitals shown include unfiled device data.  Last Pain:  Vitals:   04/26/23 1314  TempSrc:   PainSc: 3       Patients Stated Pain Goal: 1 (04/26/23 1314)  Complications: No notable events documented.

## 2023-04-27 ENCOUNTER — Encounter (HOSPITAL_COMMUNITY): Payer: Self-pay | Admitting: Neurosurgery

## 2023-04-27 DIAGNOSIS — M4802 Spinal stenosis, cervical region: Secondary | ICD-10-CM | POA: Diagnosis not present

## 2023-04-27 MED ORDER — CYCLOBENZAPRINE HCL 10 MG PO TABS: 10.0000 mg | ORAL_TABLET | Freq: Three times a day (TID) | ORAL | 0 refills | Status: DC | PRN

## 2023-04-27 MED ORDER — HYDROCODONE-ACETAMINOPHEN 5-325 MG PO TABS: 1.0000 | ORAL_TABLET | ORAL | 0 refills | Status: DC | PRN

## 2023-04-27 MED FILL — Thrombin For Soln 5000 Unit: CUTANEOUS | Qty: 2 | Status: AC

## 2023-04-27 NOTE — Discharge Summary (Signed)
Physician Discharge Summary  Patient ID: Mitchell Morgan MRN: [id number] DOB/AGE: 1945/05/31 77 y.o.  Admit date: 04/26/2023 Discharge date: 04/27/2023  Admission Diagnoses:  Discharge Diagnoses:  Principal Problem:   Cervical spondylosis with myelopathy and radiculopathy   Discharged Condition: good  Hospital Course: Patient admitted to the hospital where he underwent uncomplicated C4-5 anterior cervical discectomy and fusion for treatment of his compressive cervical myelopathy.  Postoperatively he is doing very well.  Preoperative upper extremity numbness and weakness much improved.  Standing and walking better.  Swallowing well.  Voice strong.  Ready for discharge home.  Consults:   Significant Diagnostic Studies:   Treatments:   Discharge Exam: Blood pressure (!) 140/72, pulse 96, temperature 98.8 F (37.1 C), temperature source Oral, resp. rate 20, height 5\' 10"  (1.778 m), weight 94.3 kg, SpO2 97%. Awake and alert.  Oriented and appropriate.  Motor and sensory function stable.  Wound clean and dry.  Chest and abdomen benign.  Disposition: Discharge disposition: 01-Home or Self Care        Allergies as of 04/27/2023       Reactions   Iodine Itching, Swelling   Red Eyes, Watery    Pravastatin Other (See Comments)   myalgia   Shrimp [shellfish Allergy] Itching, Swelling   Lobster, crab Swollen eyes   Simvastatin Other (See Comments)   myalgia        Medication List     TAKE these medications    acetaminophen 500 MG tablet Commonly known as: TYLENOL Take 500-1,000 mg by mouth every 6 (six) hours as needed for moderate pain (pain score 4-6).   amLODipine 10 MG tablet Commonly known as: NORVASC Take 10 mg by mouth daily.   atenolol 50 MG tablet Commonly known as: TENORMIN Take 50 mg by mouth daily.   atorvastatin 10 MG tablet Commonly known as: LIPITOR Take 10 mg by mouth daily.   cholecalciferol 25 MCG (1000 UNIT) tablet Commonly known as:  VITAMIN D3 Take 1,000 Units by mouth daily.   cyclobenzaprine 10 MG tablet Commonly known as: FLEXERIL Take 1 tablet (10 mg total) by mouth 3 (three) times daily as needed for muscle spasms.   diclofenac Sodium 1 % Gel Commonly known as: VOLTAREN Apply 2 g topically in the morning, at noon, and at bedtime. What changed:  when to take this reasons to take this   HYDROcodone-acetaminophen 5-325 MG tablet Commonly known as: NORCO/VICODIN Take 1 tablet by mouth every 4 (four) hours as needed for moderate pain (pain score 4-6) ((score 4 to 6)).   pantoprazole 40 MG tablet Commonly known as: PROTONIX Take 1 tablet (40 mg total) by mouth 2 (two) times daily. What changed: when to take this   polyethylene glycol powder 17 GM/SCOOP powder Commonly known as: GLYCOLAX/MIRALAX Take 1 capful (17 g) by mouth daily. What changed:  when to take this reasons to take this   Prolia 60 MG/ML Sosy injection Generic drug: denosumab Inject 60 mg into the skin every 6 (six) months.   senna 8.6 MG tablet Commonly known as: SENOKOT Take 2 tablets by mouth daily as needed for constipation.   zolpidem 10 MG tablet Commonly known as: AMBIEN Take 10 mg by mouth at bedtime.        Follow-up Information     Julio Sicks, MD. Call.   Specialty: Neurosurgery Why: As needed, If symptoms worsen Contact information: [address] [place] [address] [phone]  Signed: Kathaleen Maser Jorgeluis Gurganus 04/27/2023, 10:00 AM

## 2023-04-27 NOTE — Discharge Instructions (Signed)
  Wound Care Leave steri-strips on neck.  They will fall off by themselves. Do not put any creams, lotions, or ointments on incision. You are fine to shower. Let water run over incision and pat dry.  Activity Walk each and every day, increasing distance each day. No lifting greater than 8 lbs.  Avoid excessive neck motion. No driving, or riding a car unless coming back and forth to see the doctor  Diet Resume your normal diet.   Call Your Doctor If Any of These Occur Redness, drainage, or swelling at the wound.  Temperature greater than 101 degrees. Severe pain not relieved by pain medication. Incision starts to come apart.  Follow Up Appt Call [phone] if you have one or any problem.

## 2023-04-27 NOTE — Progress Notes (Signed)
Patient alert and oriented, mae's well, voiding adequate amount of urine, swallowing without difficulty, no c/o pain at time of discharge. Patient discharged home with family. Script and discharged instructions given to patient. Patient and family stated understanding of instructions given. Patient has an appointment with Dr. Pool  

## 2023-04-27 NOTE — Evaluation (Signed)
Occupational Therapy Evaluation Patient Details Name: Mitchell Morgan MRN: [id number] DOB: 16-Aug-1945 Today's Date: 04/27/2023   History of Present Illness Pt is a 77 y/o M s/p C4-5 ACDF. PMH includes arthritis, depression, diverticulosis, GERD, headache, HTN   Clinical Impression   Pt reports ind at baseline with ADLs and uses cane for functional mobility, lives with spouse who can assist at d/c. Pt currently needing CGA for ADLs, mod I for bed mobility and CGA for transfers with [facility]. Pt educated on collar wear, compensatory strategies for ADLs, and cervical precautions, pt verbalized understanding and handout provided. Pt presenting with impairments listed below, will follow acutely. Anticipate no OT follow up needs at d/c.        If plan is discharge home, recommend the following: A little help with walking and/or transfers;A little help with bathing/dressing/bathroom;Assist for transportation;Assistance with cooking/housework    Functional Status Assessment  Patient has had a recent decline in their functional status and demonstrates the ability to make significant improvements in function in a reasonable and predictable amount of time.  Equipment Recommendations  None recommended by OT (pt has all needed DME)    Recommendations for Other Services       Precautions / Restrictions Precautions Precautions: Cervical Precaution Booklet Issued: Yes (comment) Precaution Comments: educated on cervical prec Required Braces or Orthoses: Cervical Brace Cervical Brace: Soft collar;At all times Restrictions Weight Bearing Restrictions: No      Mobility Bed Mobility Overal bed mobility: Modified Independent             General bed mobility comments: log roll technique    Transfers Overall transfer level: Needs assistance Equipment used: Straight cane Transfers: Sit to/from Stand Sit to Stand: Contact guard assist                  Balance Overall balance assessment:  Mild deficits observed, not formally tested                                         ADL either performed or assessed with clinical judgement   ADL Overall ADL's : Needs assistance/impaired Eating/Feeding: Set up   Grooming: Set up   Upper Body Bathing: Contact guard assist   Lower Body Bathing: Contact guard assist   Upper Body Dressing : Contact guard assist   Lower Body Dressing: Contact guard assist   Toilet Transfer: Contact guard assist   Toileting- Clothing Manipulation and Hygiene: Contact guard assist       Functional mobility during ADLs: Contact guard assist       Vision   Vision Assessment?: No apparent visual deficits     Perception Perception: Not tested       Praxis Praxis: Not tested       Pertinent Vitals/Pain Pain Assessment Pain Assessment: No/denies pain     Extremity/Trunk Assessment Upper Extremity Assessment Upper Extremity Assessment: Overall WFL for tasks assessed   Lower Extremity Assessment Lower Extremity Assessment: Defer to PT evaluation   Cervical / Trunk Assessment Cervical / Trunk Assessment: Neck Surgery   Communication Communication Communication: No apparent difficulties   Cognition Arousal: Alert Behavior During Therapy: WFL for tasks assessed/performed Overall Cognitive Status: Within Functional Limits for tasks assessed  General Comments  VSS    Exercises     Shoulder Instructions      Home Living Family/patient expects to be discharged to:: Private residence Living Arrangements: Spouse/significant other;Children Available Help at Discharge: Family;Available 24 hours/day Type of Home: House Home Access: Stairs to enter [employer] of Steps: 1   Home Layout: One level     Bathroom Shower/Tub: Producer, television/film/video: Handicapped height Bathroom Accessibility: Yes   Home Equipment: Agricultural consultant (2  wheels);Rollator (4 wheels);Cane - single point;Grab bars - tub/shower;Shower seat - built in          Prior Functioning/Environment Prior Level of Function : Independent/Modified Independent             Mobility Comments: rollator ADLs Comments: ind        OT Problem List: Decreased strength;Decreased range of motion;Decreased activity tolerance;Impaired balance (sitting and/or standing);Decreased knowledge of precautions      OT Treatment/Interventions: Self-care/ADL training;Therapeutic exercise;Energy conservation;DME and/or AE instruction;Therapeutic activities;Patient/family education;Balance training    OT Goals(Current goals can be found in the care plan section) Acute Rehab OT Goals Patient Stated Goal: none stated OT Goal Formulation: With patient Time For Goal Achievement: 05/11/23 Potential to Achieve Goals: Good  OT Frequency: Min 1X/week    Co-evaluation              AM-PAC OT "6 Clicks" Daily Activity     Outcome Measure Help from another person eating meals?: None Help from another person taking care of personal grooming?: None Help from another person toileting, which includes using toliet, bedpan, or urinal?: A Little Help from another person bathing (including washing, rinsing, drying)?: A Little Help from another person to put on and taking off regular upper body clothing?: A Little Help from another person to put on and taking off regular lower body clothing?: A Little 6 Click Score: 20   End of Session Equipment Utilized During Treatment: Cervical collar Nurse Communication: Mobility status  Activity Tolerance: Patient tolerated treatment well Patient left: in bed;with call bell/phone within reach  OT Visit Diagnosis: Unsteadiness on feet (R26.81);Other abnormalities of gait and mobility (R26.89);Muscle weakness (generalized) (M62.81)                Time: 1191-4782 OT Time Calculation (min): 31 min Charges:  OT General Charges $OT  Visit: 1 Visit OT Evaluation $OT Eval Low Complexity: 1 Low OT Treatments $Self Care/Home Management : 8-22 mins  Carver Fila, OTD, OTR/L SecureChat Preferred Acute Rehab [phone]   Aleathia Purdy K Koonce 04/27/2023, 9:22 AM

## 2023-04-27 NOTE — Plan of Care (Signed)
Problem: Education: Goal: Knowledge of General Education information will improve Description: Including pain rating scale, medication(s)/side effects and non-pharmacologic comfort measures 04/27/2023 0816 by Vincente Liberty, RN Outcome: Completed/Met 04/26/2023 1835 by Vincente Liberty, RN Outcome: Progressing   Problem: Health Behavior/Discharge Planning: Goal: Ability to manage health-related needs will improve 04/27/2023 0816 by Vincente Liberty, RN Outcome: Completed/Met 04/26/2023 1835 by Vincente Liberty, RN Outcome: Progressing   Problem: Clinical Measurements: Goal: Ability to maintain clinical measurements within normal limits will improve 04/27/2023 0816 by Vincente Liberty, RN Outcome: Completed/Met 04/26/2023 1835 by Vincente Liberty, RN Outcome: Progressing Goal: Will remain free from infection 04/27/2023 0816 by Vincente Liberty, RN Outcome: Completed/Met 04/26/2023 1835 by Vincente Liberty, RN Outcome: Progressing Goal: Diagnostic test results will improve 04/27/2023 0816 by Vincente Liberty, RN Outcome: Completed/Met 04/26/2023 1835 by Vincente Liberty, RN Outcome: Progressing Goal: Respiratory complications will improve 04/27/2023 0816 by Vincente Liberty, RN Outcome: Completed/Met 04/26/2023 1835 by Vincente Liberty, RN Outcome: Progressing Goal: Cardiovascular complication will be avoided 04/27/2023 0816 by Vincente Liberty, RN Outcome: Completed/Met 04/26/2023 1835 by Vincente Liberty, RN Outcome: Progressing   Problem: Activity: Goal: Risk for activity intolerance will decrease 04/27/2023 0816 by Vincente Liberty, RN Outcome: Completed/Met 04/26/2023 1835 by Vincente Liberty, RN Outcome: Progressing   Problem: Nutrition: Goal: Adequate nutrition will be maintained 04/27/2023 0816 by Vincente Liberty, RN Outcome: Completed/Met 04/26/2023 1835 by Vincente Liberty, RN Outcome: Progressing   Problem: Coping: Goal: Level of anxiety will  decrease 04/27/2023 0816 by Vincente Liberty, RN Outcome: Completed/Met 04/26/2023 1835 by Vincente Liberty, RN Outcome: Progressing   Problem: Elimination: Goal: Will not experience complications related to bowel motility 04/27/2023 0816 by Vincente Liberty, RN Outcome: Completed/Met 04/26/2023 1835 by Vincente Liberty, RN Outcome: Progressing Goal: Will not experience complications related to urinary retention 04/27/2023 0816 by Vincente Liberty, RN Outcome: Completed/Met 04/26/2023 1835 by Vincente Liberty, RN Outcome: Progressing   Problem: Pain Management: Goal: General experience of comfort will improve 04/27/2023 0816 by Vincente Liberty, RN Outcome: Completed/Met 04/26/2023 1835 by Vincente Liberty, RN Outcome: Progressing   Problem: Safety: Goal: Ability to remain free from injury will improve 04/27/2023 0816 by Vincente Liberty, RN Outcome: Completed/Met 04/26/2023 1835 by Vincente Liberty, RN Outcome: Progressing   Problem: Skin Integrity: Goal: Risk for impaired skin integrity will decrease 04/27/2023 0816 by Vincente Liberty, RN Outcome: Completed/Met 04/26/2023 1835 by Vincente Liberty, RN Outcome: Progressing   Problem: Education: Goal: Ability to verbalize activity precautions or restrictions will improve 04/27/2023 0816 by Vincente Liberty, RN Outcome: Completed/Met 04/26/2023 1835 by Vincente Liberty, RN Outcome: Progressing Goal: Knowledge of the prescribed therapeutic regimen will improve 04/27/2023 0816 by Vincente Liberty, RN Outcome: Completed/Met 04/26/2023 1835 by Vincente Liberty, RN Outcome: Progressing Goal: Understanding of discharge needs will improve 04/27/2023 0816 by Vincente Liberty, RN Outcome: Completed/Met 04/26/2023 1835 by Vincente Liberty, RN Outcome: Progressing   Problem: Activity: Goal: Ability to avoid complications of mobility impairment will improve 04/27/2023 0816 by Vincente Liberty, RN Outcome: Completed/Met 04/26/2023  1835 by Vincente Liberty, RN Outcome: Progressing Goal: Ability to tolerate increased activity will improve 04/27/2023 0816 by Vincente Liberty, RN Outcome: Completed/Met 04/26/2023 1835 by Vincente Liberty, RN Outcome: Progressing Goal: Will remain free from falls 04/27/2023 0816 by Vincente Liberty, RN Outcome: Completed/Met 04/26/2023 1835 by Vincente Liberty, RN Outcome: Progressing   Problem: Bowel/Gastric: Goal: Gastrointestinal status for postoperative course will improve 04/27/2023 0816 by Vincente Liberty, RN Outcome: Completed/Met 04/26/2023 1835 by Vincente Liberty, RN Outcome: Progressing   Problem: Clinical Measurements: Goal: Ability to maintain clinical measurements  within normal limits will improve 04/27/2023 0816 by Vincente Liberty, RN Outcome: Completed/Met 04/26/2023 1835 by Vincente Liberty, RN Outcome: Progressing Goal: Postoperative complications will be avoided or minimized 04/27/2023 0816 by Vincente Liberty, RN Outcome: Completed/Met 04/26/2023 1835 by Vincente Liberty, RN Outcome: Progressing Goal: Diagnostic test results will improve 04/27/2023 0816 by Vincente Liberty, RN Outcome: Completed/Met 04/26/2023 1835 by Vincente Liberty, RN Outcome: Progressing   Problem: Pain Management: Goal: Pain level will decrease 04/27/2023 0816 by Vincente Liberty, RN Outcome: Completed/Met 04/26/2023 1835 by Vincente Liberty, RN Outcome: Progressing   Problem: Skin Integrity: Goal: Will show signs of wound healing 04/27/2023 0816 by Vincente Liberty, RN Outcome: Completed/Met 04/26/2023 1835 by Vincente Liberty, RN Outcome: Progressing   Problem: Health Behavior/Discharge Planning: Goal: Identification of resources available to assist in meeting health care needs will improve 04/27/2023 0816 by Vincente Liberty, RN Outcome: Completed/Met 04/26/2023 1835 by Vincente Liberty, RN Outcome: Progressing   Problem: Bladder/Genitourinary: Goal: Urinary  functional status for postoperative course will improve 04/27/2023 0816 by Vincente Liberty, RN Outcome: Completed/Met 04/26/2023 1835 by Vincente Liberty, RN Outcome: Progressing

## 2023-05-24 ENCOUNTER — Ambulatory Visit: Payer: Medicare HMO | Admitting: Physical Medicine and Rehabilitation

## 2024-04-10 ENCOUNTER — Encounter: Payer: Self-pay | Admitting: Physical Medicine and Rehabilitation

## 2024-04-10 ENCOUNTER — Encounter: Attending: Physical Medicine and Rehabilitation | Admitting: Physical Medicine and Rehabilitation

## 2024-04-10 VITALS — BP 132/71 | HR 55 | Ht 70.0 in | Wt 217.0 lb

## 2024-04-10 DIAGNOSIS — G8222 Paraplegia, incomplete: Secondary | ICD-10-CM | POA: Diagnosis not present

## 2024-04-10 DIAGNOSIS — M792 Neuralgia and neuritis, unspecified: Secondary | ICD-10-CM | POA: Insufficient documentation

## 2024-04-10 DIAGNOSIS — K592 Neurogenic bowel, not elsewhere classified: Secondary | ICD-10-CM | POA: Insufficient documentation

## 2024-04-10 DIAGNOSIS — R252 Cramp and spasm: Secondary | ICD-10-CM | POA: Insufficient documentation

## 2024-04-10 DIAGNOSIS — M4714 Other spondylosis with myelopathy, thoracic region: Secondary | ICD-10-CM | POA: Insufficient documentation

## 2024-04-10 DIAGNOSIS — R29898 Other symptoms and signs involving the musculoskeletal system: Secondary | ICD-10-CM | POA: Diagnosis not present

## 2024-04-10 MED ORDER — TIZANIDINE HCL 4 MG PO TABS: 4.0000 mg | ORAL_TABLET | Freq: Three times a day (TID) | ORAL | 5 refills | Status: AC

## 2024-04-10 NOTE — Patient Instructions (Signed)
 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   Duloxetine  didn't work for him, nor did baclofen , but only had low dose. .   2.   Voltaren  gel only penetrates 1 1/2 in into tissues  3.     Side effects of Tizanidine  are sedation/sleepiness and Low BP.   Tizanidine  4 mg 2x/day-  x 2 weeks- (can increase earlier if doing great)- 4 mg in And 8 mg nightly- 1 hour before bed-   4.  Spasticity can masquerade as strength- so know this- could feel weaker as we increase spasticity meds.  When lays down, you stretch rubber bands. Stand for a second then walk.    5.  Mild neurogenic bowel- needs bowel meds. As taking.   6.  Lay on your stomach- at bedtime- 10 minutes 2x/day- - to reduce loss of range of motion- I suggest in AM before gets out of bed and before goes to sleep.   7. Walking more will be helpful- use rolator to walk.  Start with flat surfaces-   8.  Once we get spasticity better controlled, you call me- don't wait for appt, and will refer to Neurorehab.  3rd st-   9. If this dosing of meds is not enough, we will have to add baclofen  back.    10.  F/U in 3 months- double appt- SCI

## 2024-04-10 NOTE — Progress Notes (Signed)
 Subjective:    Patient ID: Mitchell Morgan., male    DOB: 08/02/1945, 78 y.o.   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  Some days are better than others Nights- bothers him more at nighttime  Still having pretty erratic spasms.  At night-  On gabapentin and zanaflex -   Went to Neurologist- and they switched to Zanaflex -  No BP issues on that- no dizziness/lightheaded-  On 2 mg  at bedtime.   Gabapentin 300 mg TID-  Some days spasms jump on top of him.   Working on on Gabapentin- had to add the 3rd dose due to nerve pain-  Some days slept great, and nerve pain is better and some days as day goes on, nerve pain gets worse.   Had problems with Duloxetine  -wasn't working for nerve pain Dr Louis said the Sx's were due to scar tissue.   Last 2024-04-28 had neck surgery done- C4/5 ACDF  R arm was fine- 100x worse with N/T and nerve pain since neck surgery-  In 12/24.    Has bladder and bowel urgency- has to go immediately. Runs to go.  Has a lot of constipation-  Just take Senna 3 tabs- not every day- takes only if couple days without BM and 2-3 colace.  Rare that has had to do bowel program-  2x4 pushes on his butt- no padding on buttocks.   Went to PT at [facility]- wasn't a neurorehab person- but said she didn't know what to do with him.   Pain Inventory Average Pain 7 Pain Right Now 7 My pain is intermittent, constant, burning, dull, stabbing, tingling, and aching  In the last 24 hours, has pain interfered with the following? General activity 7 Relation with others 4 Enjoyment of life 4 What TIME of day is your pain at its worst? night Sleep (in general) Poor  Pain is  worse with: standing Pain improves with: rest and medication Relief from Meds: 7  Family History  Problem Relation Age of Onset   Hypertension Mother    Kidney failure Mother    Heart disease Father    Social History   Socioeconomic History   Marital status: Married    Spouse name: Not on file   Number of children: 1   Years of education: Not on file   Highest education level: Not on file  Occupational History   Not on file  Tobacco Use   Smoking status: Never   Smokeless tobacco: Never  Vaping Use   Vaping status: Never Used  Substance and Sexual Activity   Alcohol use: No   Drug use: Never   Sexual activity: Not on file  Other Topics Concern   Not on file  Social History Narrative   Pt's son passed away in 04-28-2022. Pt has one other son that is living   Social Drivers of Corporate Investment Banker Strain: Low Risk  (09/09/2022)   Received [facility]   Overall Financial Resource Strain (CARDIA)    Difficulty of Paying Living Expenses: Not hard at all  Food Insecurity: No Food Insecurity (11/13/2023)   [facility] from [facility]   Hunger Vital Sign    Within the past 12 months, you worried that your food would run out before you got the money to buy more.: Never true    Within the past 12 months, the food you bought just didn't last and you didn't have money to get more.: Never true  Transportation Needs: No Transportation Needs (11/13/2023)   [facility] from [facility]    [employer] - Transportation    Lack of Transportation (Medical): No    Lack of Transportation (Non-Medical): No  Physical Activity: Not on file  Stress: Not on file  Social Connections: Not on file   Past Surgical History:  Procedure Laterality Date   ANTERIOR CERVICAL DECOMP/DISCECTOMY FUSION N/A 04/26/2023   Procedure: Anterior Cervical Decompression/Discectomy Fusion - Cervical Four-Cervical Five;  Surgeon: Louis Shove, MD;  Location: [facility] OR;   Service: Neurosurgery;  Laterality: N/A;  3C   BASAL CELL CARCINOMA EXCISION     left cheek   DECOMPRESSIVE LUMBAR LAMINECTOMY LEVEL 1 N/A 10/15/2022   Procedure: DECOMPRESSIVE thoracic laminectomy THORACIC TEN -ELEVEN;  Surgeon: Louis Shove, MD;  Location: [facility] OR;  Service: Neurosurgery;  Laterality: N/A;   Past Surgical History:  Procedure Laterality Date   ANTERIOR CERVICAL DECOMP/DISCECTOMY FUSION N/A 04/26/2023   Procedure: Anterior Cervical Decompression/Discectomy Fusion - Cervical Four-Cervical Five;  Surgeon: Louis Shove, MD;  Location: [facility] OR;  Service: Neurosurgery;  Laterality: N/A;  3C   BASAL CELL CARCINOMA EXCISION     left cheek   DECOMPRESSIVE LUMBAR LAMINECTOMY LEVEL 1 N/A 10/15/2022   Procedure: DECOMPRESSIVE thoracic laminectomy THORACIC TEN -ELEVEN;  Surgeon: Louis Shove, MD;  Location: [facility] OR;  Service: Neurosurgery;  Laterality: N/A;   Past Medical History:  Diagnosis Date   Arthritis    spine   Basal cell carcinoma    left cheek   BPH (benign prostatic hyperplasia)    Depression    pt lost his son in 2023   Diverticulosis    GERD (gastroesophageal reflux disease)    Headache    Heart murmur    pt has had ECHO 11/05/16- [facility]   High cholesterol    Hip pain, bilateral    left > right   Hypertension    Insomnia    Knee pain, bilateral    Prediabetes    RBBB    Rotator cuff disorder, left    Wt 217 lb (98.4 kg)   BMI 31.14 kg/m   Opioid Risk Score:   Fall Risk Score:  `1  Depression screen [facility] 2/9     12/07/2022    1:02 PM  Depression screen PHQ 2/9  Decreased Interest 3  Down, Depressed, Hopeless 1  PHQ - 2 Score 4  Altered sleeping 0  Tired, decreased energy 3  Change in appetite 1  Feeling bad or failure about yourself  2  Trouble concentrating 3  Moving slowly or fidgety/restless 1  Suicidal thoughts 0  PHQ-9 Score 14      Data saved with a previous flowsheet row definition    Review of Systems  Gastrointestinal:  Positive for  constipation.  Musculoskeletal:  Positive for back pain and gait problem.       Pain both legs & feet  All other systems reviewed and are negative.      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   Duloxetine  didn't work for him, nor did baclofen , but only had low dose. .   2.   Voltaren  gel only penetrates 1 1/2 in into tissues  3.     Side effects of Tizanidine  are sedation/sleepiness and Low BP.   Tizanidine  4 mg 2x/day-  x 2 weeks- (can increase earlier if doing great)- 4 mg in And 8 mg nightly- 1 hour before bed-   4.  Spasticity can masquerade as strength- so know this- could feel weaker as we increase spasticity meds.  When lays down, you stretch rubber bands. Stand for a second then walk.    5.  Mild neurogenic bowel- needs bowel meds. As taking.   6.  Lay on your stomach- at bedtime- 10 minutes 2x/day- - to reduce loss of range of motion- I suggest in AM before gets out of bed and before goes to sleep.   7. Walking more will be helpful- use rolator to walk.  Start with flat surfaces-   8.  Once we get spasticity better controlled, you call me- don't wait for appt, and will refer to Neurorehab.  3rd st-   9. If this dosing of meds is not enough, we will have to add baclofen  back.     10.  F/U in 3 months- double appt- SCI- cervical/thoracic myelopathy   I spent a total of  42  minutes on total care today- >50% coordination of care- due to  d/w pt about spasticity- and education - as well as walking- and Neurogenic bowel and bladder-  and SCI.

## 2024-05-19 ENCOUNTER — Telehealth: Payer: Self-pay | Admitting: *Deleted

## 2024-05-19 NOTE — Telephone Encounter (Signed)
 Mitchell Morgan called and was asking about gabapentin. He is not seeing Dr Louis anymore and he was under the impression that she was going to refill it. He has enough to get through [facility].

## 2024-05-22 MED ORDER — GABAPENTIN 300 MG PO CAPS: 300.0000 mg | ORAL_CAPSULE | Freq: Three times a day (TID) | ORAL | 1 refills | Status: AC

## 2024-05-22 NOTE — Telephone Encounter (Signed)
 Willing to take over gabapentin .  Not sure the dosage he's on- will send in 300 mg 3x/day.   Sent in to [facility] [address]- since that's the pharmacy on computer.   Please let pt know-

## 2024-07-14 ENCOUNTER — Encounter: Admitting: Physical Medicine and Rehabilitation
